# Patient Record
Sex: Female | Born: 1960 | Race: White | Hispanic: No | Marital: Married | Smoking: Never smoker
Health system: Southern US, Community
[De-identification: ages and names within clinical notes are randomized; demographics above are authoritative.]

## PROBLEM LIST (undated history)

## (undated) DIAGNOSIS — K635 Polyp of colon: Secondary | ICD-10-CM

## (undated) DIAGNOSIS — B009 Herpesviral infection, unspecified: Secondary | ICD-10-CM

## (undated) DIAGNOSIS — Z8719 Personal history of other diseases of the digestive system: Secondary | ICD-10-CM

## (undated) DIAGNOSIS — R002 Palpitations: Secondary | ICD-10-CM

## (undated) DIAGNOSIS — R197 Diarrhea, unspecified: Secondary | ICD-10-CM

## (undated) DIAGNOSIS — F419 Anxiety disorder, unspecified: Secondary | ICD-10-CM

## (undated) DIAGNOSIS — R079 Chest pain, unspecified: Secondary | ICD-10-CM

## (undated) HISTORY — DX: Polyp of colon: K63.5

## (undated) HISTORY — DX: Anxiety disorder, unspecified: F41.9

## (undated) HISTORY — DX: Palpitations: R00.2

## (undated) HISTORY — PX: CATARACT EXTRACTION: SUR2

## (undated) HISTORY — DX: Herpesviral infection, unspecified: B00.9

## (undated) HISTORY — DX: Personal history of other diseases of the digestive system: Z87.19

## (undated) HISTORY — DX: Chest pain, unspecified: R07.9

## (undated) HISTORY — DX: Diarrhea, unspecified: R19.7

## (undated) HISTORY — PX: OTHER SURGICAL HISTORY: SHX169

---

## 1996-04-27 HISTORY — PX: ABDOMINAL HYSTERECTOMY: SHX81

## 1997-12-21 ENCOUNTER — Encounter: Admission: RE | Admit: 1997-12-21 | Discharge: 1998-03-21 | Payer: Self-pay | Admitting: Psychiatry

## 1998-05-24 ENCOUNTER — Other Ambulatory Visit: Admission: RE | Admit: 1998-05-24 | Discharge: 1998-05-24 | Payer: Self-pay | Admitting: Gynecology

## 1998-07-26 ENCOUNTER — Inpatient Hospital Stay (HOSPITAL_COMMUNITY): Admission: AD | Admit: 1998-07-26 | Discharge: 1998-07-28 | Payer: Self-pay | Admitting: *Deleted

## 2000-05-21 ENCOUNTER — Encounter: Admission: RE | Admit: 2000-05-21 | Discharge: 2000-08-19 | Payer: Self-pay | Admitting: Gynecology

## 2000-06-25 ENCOUNTER — Other Ambulatory Visit: Admission: RE | Admit: 2000-06-25 | Discharge: 2000-06-25 | Payer: Self-pay | Admitting: Gynecology

## 2001-07-22 ENCOUNTER — Other Ambulatory Visit: Admission: RE | Admit: 2001-07-22 | Discharge: 2001-07-22 | Payer: Self-pay | Admitting: Gynecology

## 2001-08-05 ENCOUNTER — Encounter: Admission: RE | Admit: 2001-08-05 | Discharge: 2001-08-05 | Payer: Self-pay | Admitting: Gynecology

## 2001-08-05 ENCOUNTER — Encounter: Payer: Self-pay | Admitting: Gynecology

## 2001-08-11 ENCOUNTER — Encounter: Payer: Self-pay | Admitting: Gynecology

## 2001-08-11 ENCOUNTER — Encounter: Admission: RE | Admit: 2001-08-11 | Discharge: 2001-08-11 | Payer: Self-pay | Admitting: Gynecology

## 2002-01-18 ENCOUNTER — Ambulatory Visit: Admission: RE | Admit: 2002-01-18 | Discharge: 2002-01-18 | Payer: Self-pay | Admitting: Gynecology

## 2003-06-08 ENCOUNTER — Other Ambulatory Visit: Admission: RE | Admit: 2003-06-08 | Discharge: 2003-06-08 | Payer: Self-pay | Admitting: Gynecology

## 2003-07-24 ENCOUNTER — Ambulatory Visit (HOSPITAL_COMMUNITY): Admission: RE | Admit: 2003-07-24 | Discharge: 2003-07-24 | Payer: Self-pay | Admitting: Gynecology

## 2004-11-21 ENCOUNTER — Other Ambulatory Visit: Admission: RE | Admit: 2004-11-21 | Discharge: 2004-11-21 | Payer: Self-pay | Admitting: Gynecology

## 2004-12-05 ENCOUNTER — Ambulatory Visit (HOSPITAL_COMMUNITY): Admission: RE | Admit: 2004-12-05 | Discharge: 2004-12-05 | Payer: Self-pay | Admitting: Gynecology

## 2006-01-26 ENCOUNTER — Ambulatory Visit (HOSPITAL_COMMUNITY): Admission: RE | Admit: 2006-01-26 | Discharge: 2006-01-26 | Payer: Self-pay | Admitting: Gynecology

## 2006-01-29 ENCOUNTER — Other Ambulatory Visit: Admission: RE | Admit: 2006-01-29 | Discharge: 2006-01-29 | Payer: Self-pay | Admitting: Gynecology

## 2007-02-14 ENCOUNTER — Other Ambulatory Visit: Admission: RE | Admit: 2007-02-14 | Discharge: 2007-02-14 | Payer: Self-pay | Admitting: Gynecology

## 2007-03-08 ENCOUNTER — Ambulatory Visit (HOSPITAL_COMMUNITY): Admission: RE | Admit: 2007-03-08 | Discharge: 2007-03-08 | Payer: Self-pay | Admitting: Gynecology

## 2007-03-14 ENCOUNTER — Encounter: Admission: RE | Admit: 2007-03-14 | Discharge: 2007-03-14 | Payer: Self-pay | Admitting: Gynecology

## 2007-06-14 ENCOUNTER — Encounter: Admission: RE | Admit: 2007-06-14 | Discharge: 2007-06-14 | Payer: Self-pay | Admitting: Family Medicine

## 2008-02-24 ENCOUNTER — Other Ambulatory Visit: Admission: RE | Admit: 2008-02-24 | Discharge: 2008-02-24 | Payer: Self-pay | Admitting: Gynecology

## 2008-02-24 ENCOUNTER — Encounter: Payer: Self-pay | Admitting: Gynecology

## 2008-02-24 ENCOUNTER — Ambulatory Visit: Payer: Self-pay | Admitting: Gynecology

## 2008-03-19 ENCOUNTER — Ambulatory Visit (HOSPITAL_COMMUNITY): Admission: RE | Admit: 2008-03-19 | Discharge: 2008-03-19 | Payer: Self-pay | Admitting: Gynecology

## 2008-03-26 ENCOUNTER — Ambulatory Visit: Payer: Self-pay | Admitting: Gynecology

## 2008-04-02 ENCOUNTER — Ambulatory Visit: Payer: Self-pay | Admitting: Gynecology

## 2008-04-10 ENCOUNTER — Ambulatory Visit: Payer: Self-pay | Admitting: Gynecology

## 2009-03-15 ENCOUNTER — Other Ambulatory Visit: Admission: RE | Admit: 2009-03-15 | Discharge: 2009-03-15 | Payer: Self-pay | Admitting: Gynecology

## 2009-03-15 ENCOUNTER — Ambulatory Visit: Payer: Self-pay | Admitting: Gynecology

## 2009-03-26 ENCOUNTER — Ambulatory Visit (HOSPITAL_COMMUNITY): Admission: RE | Admit: 2009-03-26 | Discharge: 2009-03-26 | Payer: Self-pay | Admitting: Gynecology

## 2009-04-12 ENCOUNTER — Ambulatory Visit: Payer: Self-pay | Admitting: Gynecology

## 2009-05-07 ENCOUNTER — Ambulatory Visit: Payer: Self-pay | Admitting: Gynecology

## 2010-04-11 ENCOUNTER — Ambulatory Visit: Payer: Self-pay | Admitting: Women's Health

## 2010-05-18 ENCOUNTER — Encounter: Payer: Self-pay | Admitting: Gynecology

## 2010-05-18 ENCOUNTER — Encounter: Payer: Self-pay | Admitting: Neurological Surgery

## 2010-06-06 ENCOUNTER — Encounter: Payer: Self-pay | Admitting: Women's Health

## 2010-08-21 ENCOUNTER — Other Ambulatory Visit: Payer: Self-pay | Admitting: Gynecology

## 2010-08-21 DIAGNOSIS — Z1231 Encounter for screening mammogram for malignant neoplasm of breast: Secondary | ICD-10-CM

## 2010-08-22 ENCOUNTER — Other Ambulatory Visit (HOSPITAL_COMMUNITY)
Admission: RE | Admit: 2010-08-22 | Discharge: 2010-08-22 | Disposition: A | Discharge status: Home / Self Care | Payer: Managed Care, Other (non HMO) | Source: Ambulatory Visit | Attending: Gynecology | Admitting: Gynecology

## 2010-08-22 ENCOUNTER — Encounter (INDEPENDENT_AMBULATORY_CARE_PROVIDER_SITE_OTHER): Payer: Managed Care, Other (non HMO) | Admitting: Women's Health

## 2010-08-22 ENCOUNTER — Other Ambulatory Visit: Payer: Self-pay | Admitting: Women's Health

## 2010-08-22 DIAGNOSIS — Z833 Family history of diabetes mellitus: Secondary | ICD-10-CM

## 2010-08-22 DIAGNOSIS — Z01419 Encounter for gynecological examination (general) (routine) without abnormal findings: Secondary | ICD-10-CM

## 2010-08-22 DIAGNOSIS — Z13220 Encounter for screening for lipoid disorders: Secondary | ICD-10-CM

## 2010-08-22 DIAGNOSIS — Z124 Encounter for screening for malignant neoplasm of cervix: Secondary | ICD-10-CM | POA: Insufficient documentation

## 2010-08-26 ENCOUNTER — Ambulatory Visit (HOSPITAL_COMMUNITY)
Admission: RE | Admit: 2010-08-26 | Discharge: 2010-08-26 | Disposition: A | Discharge status: Home / Self Care | Payer: Managed Care, Other (non HMO) | Source: Ambulatory Visit | Attending: Gynecology | Admitting: Gynecology

## 2010-08-26 DIAGNOSIS — Z1231 Encounter for screening mammogram for malignant neoplasm of breast: Secondary | ICD-10-CM | POA: Insufficient documentation

## 2011-05-01 ENCOUNTER — Encounter: Payer: Self-pay | Admitting: Anesthesiology

## 2011-05-01 DIAGNOSIS — B009 Herpesviral infection, unspecified: Secondary | ICD-10-CM | POA: Insufficient documentation

## 2011-05-01 DIAGNOSIS — J45909 Unspecified asthma, uncomplicated: Secondary | ICD-10-CM | POA: Insufficient documentation

## 2011-05-06 ENCOUNTER — Ambulatory Visit (INDEPENDENT_AMBULATORY_CARE_PROVIDER_SITE_OTHER): Payer: Managed Care, Other (non HMO) | Admitting: Gynecology

## 2011-05-06 ENCOUNTER — Encounter: Payer: Self-pay | Admitting: Gynecology

## 2011-05-06 VITALS — BP 110/72

## 2011-05-06 DIAGNOSIS — N941 Unspecified dyspareunia: Secondary | ICD-10-CM

## 2011-05-06 DIAGNOSIS — B373 Candidiasis of vulva and vagina: Secondary | ICD-10-CM

## 2011-05-06 DIAGNOSIS — L293 Anogenital pruritus, unspecified: Secondary | ICD-10-CM

## 2011-05-06 DIAGNOSIS — N898 Other specified noninflammatory disorders of vagina: Secondary | ICD-10-CM

## 2011-05-06 DIAGNOSIS — IMO0002 Reserved for concepts with insufficient information to code with codable children: Secondary | ICD-10-CM

## 2011-05-06 DIAGNOSIS — B3731 Acute candidiasis of vulva and vagina: Secondary | ICD-10-CM

## 2011-05-06 LAB — WET PREP FOR TRICH, YEAST, CLUE
Clue Cells Wet Prep HPF POC: NONE SEEN
Trich, Wet Prep: NONE SEEN

## 2011-05-06 MED ORDER — TERCONAZOLE 0.4 % VA CREA: 1.0000 | TOPICAL_CREAM | Freq: Every day | VAGINAL | Status: DC

## 2011-05-06 NOTE — Progress Notes (Signed)
Patient presented to the office today with 2 complaints. She recently had been on antibiotic for root canal surgery. She began complaining on and off of dyspareunia on deep thrust and on withdrawal. She was also having vulvar pruritus and tried 3 days of Monistat with minimal relief. She is married in a monogamous relationship and has had a previous hysterectomy. Patient does not douche but does use K-Y jelly for lubrication during intercourse.  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 She will be placed on Terazol 7 to apply intravaginally and externally for one week and to refrain from intercourse during that time. When she resumes intercourse she will use the K-Y jelly more liberally and she still continues to experience dyspareunia she'll return to the office for further evaluation such as an ultrasound and discussion of possible laparoscopy to rule out endometriosis. Literature information was provided on both the above subject matters all questions were answered we'll follow accordingly.

## 2011-05-06 NOTE — Patient Instructions (Signed)
Dyspareunia Dyspareunia is pain during sexual intercourse. It is most common in women, but it also happens in men.  CAUSES  Female The pain from this condition is usually felt when anything is put into the vagina, but any part of the genitals may cause pain during sex. Even sitting or wearing pants can cause pain. Sometimes, a cause cannot be found. Some causes of pain during intercourse are:  Infections of the skin around the vagina.   Vaginal infections, such as a yeast, bacterial, or viral infection.   Vaginismus. This is the inability to have anything put in the vagina even when the woman wants it to happen. There is an automatic muscle contraction and pain. The pain of the muscle contraction can be so severe that intercourse is impossible.   Allergic reaction from spermicides, semen, condoms, scented tampons, soaps, douches, and vaginal sprays.   A fluid-filled sac (cyst) on the Bartholin or Skene glands, located at the opening of the vagina.   Scar tissue in the vagina from a surgically enlarged opening (episiotomy) or tearing after delivering a baby.   Vaginal dryness. This is more common in menopause. The normal secretions of the vagina are decreased. Changes in estrogen levels and increased difficulty becoming aroused can cause painful sex. Vaginal dryness can also happen when taking birth control pills.   Thinning of the tissue (atrophy) of the vulva and vagina. This makes the area thinner, smaller, unable to stretch to accommodate a penis, and prone to infection and tearing.   Vulvar vestibulitis or vestibulodynia.This is a condition that causes pain involving the area around the entrance to the vagina.The most common cause in young women is birth control pills.Women with low estrogen levels (postmenopausal women) may also experience this.Other causes include allergic reactions, too many nerve endings, skin conditions, and pelvic muscles that cannot relax.   Vulvar dermatoses.  This includes skin conditions such as lichen sclerosus and lichen planus.   Lack of foreplay to lubricate the vagina. This can cause vaginal dryness.   Noncancerous tumors (fibroids) in the uterus.   Uterus lining tissue growing outside the uterus (endometriosis).   Pregnancy that starts in the fallopian tube (tubal pregnancy).   Pregnancy or breastfeeding your baby. This can cause vaginal dryness.   A tilting or prolapse of the uterus. Prolapse is when weak and stretched muscles around the uterus allow it to fall into the vagina.   Problems with the ovaries, cysts, or scar tissue. This may be worse with certain sexual positions.   Previous surgeries causing adhesions or scar tissue in the vagina or pelvis.   Bladder and intestinal problems.   Psychological problems (such as depression or anxiety). This may make pain worse.   Negative attitudes about sex, experiencing rape, sexual assault, and misinformation about sex. These issues are often related to some types of pain.   Previous pelvic infection, causing scar tissue in the pelvis and on the female organs.   Cyst or tumor on the ovary.   Cancer of the female organs.   Certain medicines.   Medical problems such as diabetes, arthritis, or thyroid disease.  Female In men, there are many physical causes of sexual discomfort. Some causes of pain during intercourse are:  Infections of the prostate, bladder, or seminal vesicles. This can cause pain after ejaculation.   An inflamed bladder (interstitial cystitis). This may cause pain from ejaculation.   Gonorrheal infections. This may cause pain during ejaculation.   An inflamed urethra (urethritis) or inflamed  prostate (prostatitis). This can make genital stimulation painful or uncomfortable.   Deformities of the penis, such as Peyronie's disease.   A tight foreskin.   Cancer of the female organs.   Psychological problems. This may make pain worse.  DIAGNOSIS   Your  caregiver will take a history and have you describe where the pain is located (outside the vagina, in the vagina, in the pelvis). You may be asked when you experience pain, such as with penetration or with thrusting.   Following this, your caregiver will do a physical exam. Let your caregiver know if the exam is too painful.   During the final part of the female exam, your caregiver will feel your uterus and ovaries with one hand on the abdomen and one finger in your vagina. This is a pelvic exam.   Blood tests, a Pap test, cultures for infection, an ultrasound test, and X-rays may be done. You may need to see a specialist for female problems (gynecologist).   Your caregiver may do a CT scan, MRI, or laparoscopy. Laparoscopy is a procedure to look into the pelvis with a lighted tube, through a cut (incision) in the abdomen.  TREATMENT  Your caregiver can help you determine the best course of treatment. Sometimes, more testing is done. Continue with the suggested testing until your caregiver feels sure about your diagnosis and how to treat it. Sometimes, it is difficult to find the reason for the pain. The search for the cause and treatment can be frustrating. Treatment often takes several weeks to a few months before you notice any improvement. You may also need to avoid sexual activity until symptoms improve.Continuing to have sex when it hurts can delay healing and actually make the problem worse. The treatment depends on the cause of the pain. Treatment may include:  Medicines such as antibiotics, vaginal or skin creams, hormones, or antidepressants.   Minor or major surgery.   Psychological counseling or group therapy.   Kegel exercises and vaginal dilators to help certain cases of vaginismus (spasms). Do this only if recommended by your caregiver.Kegel exercises can make some problems worse.   Applying lubrication as recommended by your caregiver if you have dryness.   Sex therapy for  you and your sex partner.  It is common for the pain to continue after the reason for the pain has been treated. Some reasons for this include a conditioned response. This means the person having the pain becomes so familiar with the pain that the pain continues as a response, even though the cause is removed. Sex therapy can help with this problem. HOME CARE INSTRUCTIONS   Follow your caregiver's instructions about taking medicines, tests, counseling, and follow-up treatment.   Do not use scented tampons, douches, vaginal sprays, or soaps.   Use water-based lubricants for dryness. Oil lubricants can cause irritation.   Do not use spermicides or condoms that irritate you.   Openly discuss with your partner your sexual experience, your desires, foreplay, and different sexual positions for a more comfortable and enjoyable sexual relationship.   Join group sessions for therapy, if needed.   Practice safe sex at all times.   Empty your bladder before having intercourse.   Try different positions during sexual intercourse.   Take over-the-counter pain medicine recommended by your caregiver before having sexual intercourse.   Do not wear pantyhose. Knee-high and thigh-high hose are okay.   Avoid scrubbing your vulva with a washcloth. Wash the area gently and pat dry   with a towel.  SEEK MEDICAL CARE IF:   You develop vaginal bleeding after sexual intercourse.   You develop a lump at the opening of your vagina, even if it is not painful.   You have abnormal vaginal discharge.   You have vaginal dryness.   You have itching or irritation of the vulva or vagina.   You develop a rash or reaction to your medicine.  SEEK IMMEDIATE MEDICAL CARE IF:   You develop severe abdominal pain during or shortly after sexual intercourse. You could have a ruptured ovarian cyst or ruptured tubal pregnancy.   You have a fever.   You have painful or bloody urination.   You have painful sexual  intercourse, and you never had it before.   You pass out after having sexual intercourse.  Document Released: 05/03/2007 Document Revised: 12/24/2010 Document Reviewed: 07/14/2010 [facility] Patient Information 2012 [place], [place].Endometriosis Endometriosis is a disease that occurs when the endometrium (lining of the uterus) is misplaced outside of its normal location. It may occur in many locations close to the uterus (womb), but commonly on the ovaries, fallopian tubes, vagina (birth canal) and bowel located close to the uterus. Because the uterus sloughs (expels) its lining every month (menses), there is bleeding whereever the endometrial tissue is located. SYMPTOMS  Often there are no symptoms. However, because blood is irritating to tissues not normally exposed to it, when symptoms occur they vary with the location of the misplaced endometrium. Symptoms often include back and abdominal pain. Periods may be heavier and intercourse may be painful. Infertility may be present. You may have all of these symptoms at one time or another or you may have months with no symptoms at all. Although the symptoms occur mainly during menses, they can occur mid-cycle as well, and usually terminate with menopause. DIAGNOSIS  Your caregiver may recommend a blood test and urine test (urinalysis) to help rule out other conditions. Another common test is ultrasound, a painless procedure that uses sound waves to make a sonogram "picture" of abnormal tissue that could be endometriosis. If your bowel movements are painful around your periods, your caregiver may advise a barium enema (an X-ray of the lower bowel), to try to find the source of your pain. This is sometimes confirmed by laparoscopy. Laparoscopy is a procedure where your caregiver looks into your abdomen with a laparoscope (a small pencil sized telescope). Your caregiver may take a tiny piece of tissue (biopsy) from any abnormal tissue to confirm or document your  problem. These tissues are sent to the lab and a pathologist looks at them under the microscope to give a microscopic diagnosis. TREATMENT  Once the diagnosis is made, it can be treated by destruction of the misplaced endometrial tissue using heat (diathermy), laser, cutting (excision), or chemical means. It may also be treated with hormonal therapy. When using hormonal therapy menses are eliminated, therefore eliminating the monthly exposure to blood by the misplaced endometrial tissue. Only in severe cases is it necessary to perform a hysterectomy with removal of the tubes, uterus and ovaries. HOME CARE INSTRUCTIONS   Only take over-the-counter or prescription medicines for pain, discomfort, or fever as directed by your caregiver.   Avoid activities that produce pain, including a physical sexual relationship.   Do not take aspirin as this may increase bleeding when not on hormonal therapy.   See your caregiver for pain or problems not controlled with treatment.  SEEK IMMEDIATE MEDICAL CARE IF:   Your pain is severe  and is not responding to pain medicine.   You develop severe nausea and vomiting, or you cannot keep foods down.   Your pain localizes to the right lower part of your abdomen (possible appendicitis).   You have swelling or increasing pain in the abdomen.   You have a fever.   You see blood in your stool.  MAKE SURE YOU:   Understand these instructions.   Will watch your condition.   Will get help right away if you are not doing well or get worse.  Document Released: 04/10/2000 Document Revised: 12/24/2010 Document Reviewed: 11/30/2007 [facility] Patient Information 2012 [place], [place].

## 2011-05-13 ENCOUNTER — Telehealth: Payer: Self-pay | Admitting: *Deleted

## 2011-05-13 DIAGNOSIS — B3731 Acute candidiasis of vulva and vagina: Secondary | ICD-10-CM

## 2011-05-13 DIAGNOSIS — B373 Candidiasis of vulva and vagina: Secondary | ICD-10-CM

## 2011-05-13 DIAGNOSIS — N898 Other specified noninflammatory disorders of vagina: Secondary | ICD-10-CM

## 2011-05-13 MED ORDER — TERCONAZOLE 0.4 % VA CREA: 1.0000 | TOPICAL_CREAM | Freq: Every day | VAGINAL | Status: AC

## 2011-05-13 MED ORDER — FLUCONAZOLE 150 MG PO TABS: ORAL_TABLET | ORAL | Status: DC

## 2011-05-13 NOTE — Telephone Encounter (Signed)
Pt was see on 05/06/11 for vagina itching and giving Terazol 7 day cream. Pt has taken all rx and itching still there. Pt would like rx, please advise.

## 2011-05-13 NOTE — Telephone Encounter (Signed)
Persistent vulvovaginits will prescribe one more week of Terazol 7 to apply intravaginal for one week along with Duflucam 150mg  po every other day for three days. If persist after this she will need to be seen in the office

## 2011-05-13 NOTE — Telephone Encounter (Signed)
Rx sent pt pharmacy, pt informed with the below note.

## 2011-07-27 ENCOUNTER — Other Ambulatory Visit: Payer: Self-pay | Admitting: Gynecology

## 2011-07-27 DIAGNOSIS — Z1231 Encounter for screening mammogram for malignant neoplasm of breast: Secondary | ICD-10-CM

## 2011-08-28 ENCOUNTER — Encounter: Payer: Managed Care, Other (non HMO) | Admitting: Gynecology

## 2011-09-03 ENCOUNTER — Ambulatory Visit (HOSPITAL_COMMUNITY): Payer: Managed Care, Other (non HMO)

## 2011-09-11 ENCOUNTER — Encounter: Payer: Managed Care, Other (non HMO) | Admitting: Gynecology

## 2011-10-09 ENCOUNTER — Encounter: Payer: Self-pay | Admitting: Gynecology

## 2011-10-09 ENCOUNTER — Other Ambulatory Visit (HOSPITAL_COMMUNITY)
Admission: RE | Admit: 2011-10-09 | Discharge: 2011-10-09 | Disposition: A | Discharge status: Home / Self Care | Payer: Managed Care, Other (non HMO) | Source: Ambulatory Visit | Attending: Gynecology | Admitting: Gynecology

## 2011-10-09 ENCOUNTER — Ambulatory Visit (INDEPENDENT_AMBULATORY_CARE_PROVIDER_SITE_OTHER): Payer: Managed Care, Other (non HMO) | Admitting: Gynecology

## 2011-10-09 ENCOUNTER — Ambulatory Visit (HOSPITAL_COMMUNITY): Payer: Managed Care, Other (non HMO)

## 2011-10-09 VITALS — BP 108/70 | Ht 66.25 in | Wt 198.0 lb

## 2011-10-09 DIAGNOSIS — N941 Unspecified dyspareunia: Secondary | ICD-10-CM

## 2011-10-09 DIAGNOSIS — Z01419 Encounter for gynecological examination (general) (routine) without abnormal findings: Secondary | ICD-10-CM | POA: Insufficient documentation

## 2011-10-09 DIAGNOSIS — R635 Abnormal weight gain: Secondary | ICD-10-CM | POA: Insufficient documentation

## 2011-10-09 DIAGNOSIS — Z833 Family history of diabetes mellitus: Secondary | ICD-10-CM | POA: Insufficient documentation

## 2011-10-09 DIAGNOSIS — Z1151 Encounter for screening for human papillomavirus (HPV): Secondary | ICD-10-CM | POA: Insufficient documentation

## 2011-10-09 DIAGNOSIS — N949 Unspecified condition associated with female genital organs and menstrual cycle: Secondary | ICD-10-CM

## 2011-10-09 DIAGNOSIS — R102 Pelvic and perineal pain: Secondary | ICD-10-CM

## 2011-10-09 DIAGNOSIS — IMO0002 Reserved for concepts with insufficient information to code with codable children: Secondary | ICD-10-CM

## 2011-10-09 LAB — LIPID PANEL
Cholesterol: 223 mg/dL — ABNORMAL HIGH (ref 0–200)
HDL: 58 mg/dL (ref >39)
LDL Cholesterol: 140 mg/dL — ABNORMAL HIGH (ref 0–99)
Total CHOL/HDL Ratio: 3.8 Ratio
Triglycerides: 126 mg/dL (ref <150)
VLDL: 25 mg/dL (ref 0–40)

## 2011-10-09 LAB — CBC WITH DIFFERENTIAL/PLATELET
Basophils Absolute: 0.1 10*3/uL (ref 0.0–0.1)
Basophils Relative: 1 % (ref 0–1)
Eosinophils Absolute: 0.2 10*3/uL (ref 0.0–0.7)
Eosinophils Relative: 4 % (ref 0–5)
HCT: 41.1 % (ref 36.0–46.0)
Hemoglobin: 13.7 g/dL (ref 12.0–15.0)
Lymphocytes Relative: 35 % (ref 12–46)
Lymphs Abs: 2.3 10*3/uL (ref 0.7–4.0)
MCH: 29.5 pg (ref 26.0–34.0)
MCHC: 33.3 g/dL (ref 30.0–36.0)
MCV: 88.4 fL (ref 78.0–100.0)
Monocytes Absolute: 0.5 10*3/uL (ref 0.1–1.0)
Monocytes Relative: 7 % (ref 3–12)
Neutro Abs: 3.5 10*3/uL (ref 1.7–7.7)
Neutrophils Relative %: 53 % (ref 43–77)
Platelets: 237 10*3/uL (ref 150–400)
RBC: 4.65 MIL/uL (ref 3.87–5.11)
RDW: 13.8 % (ref 11.5–15.5)
WBC: 6.7 10*3/uL (ref 4.0–10.5)

## 2011-10-09 LAB — COMPREHENSIVE METABOLIC PANEL
ALT: 12 U/L (ref 0–35)
AST: 17 U/L (ref 0–37)
Albumin: 4.2 g/dL (ref 3.5–5.2)
Alkaline Phosphatase: 49 U/L (ref 39–117)
BUN: 9 mg/dL (ref 6–23)
CO2: 27 mEq/L (ref 19–32)
Calcium: 9.3 mg/dL (ref 8.4–10.5)
Chloride: 105 mEq/L (ref 96–112)
Creat: 0.58 mg/dL (ref 0.50–1.10)
Glucose, Bld: 93 mg/dL (ref 70–99)
Potassium: 4.2 mEq/L (ref 3.5–5.3)
Sodium: 140 mEq/L (ref 135–145)
Total Bilirubin: 0.5 mg/dL (ref 0.3–1.2)
Total Protein: 6.7 g/dL (ref 6.0–8.3)

## 2011-10-09 LAB — TSH: TSH: 1.313 u[IU]/mL (ref 0.350–4.500)

## 2011-10-09 MED ORDER — VALACYCLOVIR HCL 500 MG PO TABS: 500.0000 mg | ORAL_TABLET | Freq: Every day | ORAL | Status: DC

## 2011-10-09 NOTE — Progress Notes (Signed)
Jade [name] 1960-06-11 [id number]   History:    51 y.o.  for annual gyn exam with complaint of dyspareunia. Patient has had a prior transvaginal hysterectomy 1998. She states her dyspareunia using causing her have discomfort more right lower abdomen. Her mammogram was in 2012 which was normal she has one scheduled for later this month. She frequently does her self breast examination. She takes Valtrex for HSV suppression. She takes Advair when necessary for her asthma. She has had issues with weight gain.  Past medical history,surgical history, family history and social history were all reviewed and documented in the EPIC chart.  Gynecologic History No LMP recorded. Patient has had a hysterectomy. Contraception: none Last Pap: 2012. Results were: normal Last mammogram: 2012. Results were: normal  Obstetric History OB History    Grav Para Term Preterm Abortions TAB SAB Ect Mult Living   1 1 1       1      # Outc Date GA Lbr Len/2nd Wgt Sex Del Anes PTL Lv   1 TRM     F CS  No Yes       ROS: A ROS was performed and pertinent positives and negatives are included in the history.  GENERAL: No fevers or chills. HEENT: No change in vision, no earache, sore throat or sinus congestion. NECK: No pain or stiffness. CARDIOVASCULAR: No chest pain or pressure. No palpitations. PULMONARY: No shortness of breath, cough or wheeze. GASTROINTESTINAL: No abdominal pain, nausea, vomiting or diarrhea, melena or bright red blood per rectum. GENITOURINARY: No urinary frequency, urgency, hesitancy or dysuria. MUSCULOSKELETAL: No joint or muscle pain, no back pain, no recent trauma. DERMATOLOGIC: No rash, no itching, no lesions. ENDOCRINE: No polyuria, polydipsia, no heat or cold intolerance. No recent change in weight. HEMATOLOGICAL: No anemia or easy bruising or bleeding. NEUROLOGIC: No headache, seizures, numbness, tingling or weakness. PSYCHIATRIC: No depression, no loss of interest in normal activity or change  in sleep pattern.     Exam: chaperone present  BP 108/70  Ht 5' 6.25" (1.683 m)  Wt 198 lb (89.812 kg)  BMI 31.72 kg/m2  Body mass index is 31.72 kg/(m^2).  General appearance : Well developed well nourished female. No acute distress HEENT: Neck supple, trachea midline, no carotid bruits, no thyroidmegaly Lungs: Clear to auscultation, no rhonchi or wheezes, or rib retractions  Heart: Regular rate and rhythm, no murmurs or gallops Breast:Examined in sitting and supine position were symmetrical in appearance, no palpable masses or tenderness,  no skin retraction, no nipple inversion, no nipple discharge, no skin discoloration, no axillary or supraclavicular lymphadenopathy Abdomen: no palpable masses or tenderness, no rebound or guarding Extremities: no edema or skin discoloration or tenderness  Pelvic:  Bartholin, Urethra, Skene Glands: Within normal limits             Vagina: No gross lesions or discharge  Cervix: Absent  Uterus  absent  Adnexa  Without masses or tenderness  Anus and perineum  normal   Rectovaginal  normal sphincter tone without palpated masses or tenderness             Hemoccult not done   Assessment/Plan:  51 y.o. female for annual exam who had the following lab work drawn today: Fasting lipid profile, conference metabolic panel, TSH, CBC, urinalysis. New that near screening guidelines discussed I explained to the patient that she no longer needs Pap smears but she insisted having one last one today. Patient will return back in 2-3 weeks for  followup ultrasound to possibly determine her etiology of her dyspareunia to better assess her ovaries. She was instructed take her calcium and vitamin D and to exercise regularly. Literature information on exercise and diet was provided. She was reminded to continue to do monthly self breast examination and to followup with her mammogram in the next few weeks.    Ok Edwards MD, 9:11 AM 10/09/2011

## 2011-10-09 NOTE — Patient Instructions (Addendum)
Cholesterol Control Diet  Cholesterol levels in your body are determined significantly by your diet. Cholesterol levels may also be related to heart disease. The following material helps to explain this relationship and discusses what you can do to help keep your heart healthy. Not all cholesterol is bad. Low-density lipoprotein (LDL) cholesterol is the "bad" cholesterol. It may cause fatty deposits to build up inside your arteries. High-density lipoprotein (HDL) cholesterol is "good." It helps to remove the "bad" LDL cholesterol from your blood. Cholesterol is a very important risk factor for heart disease. Other risk factors are high blood pressure, smoking, stress, heredity, and weight. The heart muscle gets its supply of blood through the coronary arteries. If your LDL cholesterol is high and your HDL cholesterol is low, you are at risk for having fatty deposits build up in your coronary arteries. This leaves less room through which blood can flow. Without sufficient blood and oxygen, the heart muscle cannot function properly and you may feel chest pains (angina pectoris). When a coronary artery closes up entirely, a part of the heart muscle may die, causing a heart attack (myocardial infarction). CHECKING CHOLESTEROL When your caregiver sends your blood to a lab to be analyzed for cholesterol, a complete lipid (fat) profile may be done. With this test, the total amount of cholesterol and levels of LDL and HDL are determined. Triglycerides are a type of fat that circulates in the blood and can also be used to determine heart disease risk. The list below describes what the numbers should be: Test: Total Cholesterol.  Less than 200 mg/dl.  Test: LDL "bad cholesterol."  Less than 100 mg/dl.   Less than 70 mg/dl if you are at very high risk of a heart attack or sudden cardiac death.  Test: HDL "good cholesterol."  Greater than 50 mg/dl for women.    Greater than 40 mg/dl for men.  Test: Triglycerides.  Less than 150 mg/dl.  CONTROLLING CHOLESTEROL WITH DIET Although exercise and lifestyle factors are important, your diet is key. That is because certain foods are known to raise cholesterol and others to lower it. The goal is to balance foods for their effect on cholesterol and more importantly, to replace saturated and trans fat with other types of fat, such as monounsaturated fat, polyunsaturated fat, and omega-3 fatty acids. On average, a person should consume no more than 15 to 17 g of saturated fat daily. Saturated and trans fats are considered "bad" fats, and they will raise LDL cholesterol. Saturated fats are primarily found in animal products such as meats, butter, and cream. However, that does not mean you need to sacrifice all your favorite foods. Today, there are good tasting, low-fat, low-cholesterol substitutes for most of the things you like to eat. Choose low-fat or nonfat alternatives. Choose round or loin cuts of red meat, since these types of cuts are lowest in fat and cholesterol. Chicken (without the skin), fish, veal, and ground turkey breast are excellent choices. Eliminate fatty meats, such as hot dogs and salami. Even shellfish have little or no saturated fat. Have a 3 oz (85 g) portion when you eat lean meat, poultry, or fish. Trans fats are also called "partially hydrogenated oils." They are oils that have been scientifically manipulated so that they are solid at room temperature resulting in a longer shelf life and improved taste and texture of foods in which they are added. Trans fats are found in stick margarine, some tub margarines, cookies, crackers, and baked goods.    When baking and cooking, oils are an excellent substitute for butter. The monounsaturated oils are especially beneficial since it is believed they lower LDL and raise HDL. The oils you should avoid entirely are saturated tropical oils, such as coconut and  palm.  Remember to eat liberally from food groups that are naturally free of saturated and trans fat, including fish, fruit, vegetables, beans, grains (barley, rice, couscous, bulgur wheat), and pasta (without cream sauces).  IDENTIFYING FOODS THAT LOWER CHOLESTEROL  Soluble fiber may lower your cholesterol. This type of fiber is found in fruits such as apples, vegetables such as broccoli, potatoes, and carrots, legumes such as beans, peas, and lentils, and grains such as barley. Foods fortified with plant sterols (phytosterol) may also lower cholesterol. You should eat at least 2 g per day of these foods for a cholesterol lowering effect.  Read package labels to identify low-saturated fats, trans fats free, and low-fat foods at the supermarket. Select cheeses that have only 2 to 3 g saturated fat per ounce. Use a heart-healthy tub margarine that is free of trans fats or partially hydrogenated oil. When buying baked goods (cookies, crackers), avoid partially hydrogenated oils. Breads and muffins should be made from whole grains (whole-wheat or whole oat flour, instead of "flour" or "enriched flour"). Buy non-creamy canned soups with reduced salt and no added fats.  FOOD PREPARATION TECHNIQUES  Never deep-fry. If you must fry, either stir-fry, which uses very little fat, or use non-stick cooking sprays. When possible, broil, bake, or roast meats, and steam vegetables. Instead of dressing vegetables with butter or margarine, use lemon and herbs, applesauce and cinnamon (for squash and sweet potatoes), nonfat yogurt, salsa, and low-fat dressings for salads.  LOW-SATURATED FAT / LOW-FAT FOOD SUBSTITUTES Meats / Saturated Fat (g)  Avoid: Steak, marbled (3 oz/85 g) / 11 g   Choose: Steak, lean (3 oz/85 g) / 4 g   Avoid: Hamburger (3 oz/85 g) / 7 g   Choose: Hamburger, lean (3 oz/85 g) / 5 g   Avoid: Ham (3 oz/85 g) / 6 g   Choose: Ham, lean cut (3 oz/85 g) / 2.4 g   Avoid: Chicken, with skin, dark  meat (3 oz/85 g) / 4 g   Choose: Chicken, skin removed, dark meat (3 oz/85 g) / 2 g   Avoid: Chicken, with skin, light meat (3 oz/85 g) / 2.5 g   Choose: Chicken, skin removed, light meat (3 oz/85 g) / 1 g  Dairy / Saturated Fat (g)  Avoid: Whole milk (1 cup) / 5 g   Choose: Low-fat milk, 2% (1 cup) / 3 g   Choose: Low-fat milk, 1% (1 cup) / 1.5 g   Choose: Skim milk (1 cup) / 0.3 g   Avoid: Hard cheese (1 oz/28 g) / 6 g   Choose: Skim milk cheese (1 oz/28 g) / 2 to 3 g   Avoid: Cottage cheese, 4% fat (1 cup) / 6.5 g   Choose: Low-fat cottage cheese, 1% fat (1 cup) / 1.5 g   Avoid: Ice cream (1 cup) / 9 g   Choose: Sherbet (1 cup) / 2.5 g   Choose: Nonfat frozen yogurt (1 cup) / 0.3 g   Choose: Frozen fruit bar / trace   Avoid: Whipped cream (1 tbs) / 3.5 g   Choose: Nondairy whipped topping (1 tbs) / 1 g  Condiments / Saturated Fat (g)  Avoid: Mayonnaise (1 tbs) / 2 g   Choose: Low-fat   mayonnaise (1 tbs) / 1 g   Avoid: Butter (1 tbs) / 7 g   Choose: Extra light margarine (1 tbs) / 1 g   Avoid: Coconut oil (1 tbs) / 11.8 g   Choose: Olive oil (1 tbs) / 1.8 g   Choose: Corn oil (1 tbs) / 1.7 g   Choose: Safflower oil (1 tbs) / 1.2 g   Choose: Sunflower oil (1 tbs) / 1.4 g   Choose: Soybean oil (1 tbs) / 2.4 g   Choose: Canola oil (1 tbs) / 1 g  Document Released: 04/13/2005 Document Revised: 12/24/2010 Document Reviewed: 10/02/2010 [employer] Patient Information 2012 [employer].  Exercise to Lose Weight Exercise and a healthy diet may help you lose weight. Your doctor may suggest specific exercises. EXERCISE IDEAS AND TIPS  Choose low-cost things you enjoy doing, such as walking, bicycling, or exercising to workout videos.   Take stairs instead of the elevator.   Walk during your lunch break.   Park your car further away from work or school.   Go to a gym or an exercise class.   Start with 5 to 10 minutes of exercise each day. Build up to  30 minutes of exercise 4 to 6 days a week.   Wear shoes with good support and comfortable clothes.   Stretch before and after working out.   Work out until you breathe harder and your heart beats faster.   Drink extra water when you exercise.   Do not do so much that you hurt yourself, feel dizzy, or get very short of breath.  Exercises that burn about 150 calories:  Running 1  miles in 15 minutes.   Playing volleyball for 45 to 60 minutes.   Washing and waxing a car for 45 to 60 minutes.   Playing touch football for 45 minutes.   Walking 1  miles in 35 minutes.   Pushing a stroller 1  miles in 30 minutes.   Playing basketball for 30 minutes.   Raking leaves for 30 minutes.   Bicycling 5 miles in 30 minutes.   Walking 2 miles in 30 minutes.   Dancing for 30 minutes.   Shoveling snow for 15 minutes.   Swimming laps for 20 minutes.   Walking up stairs for 15 minutes.   Bicycling 4 miles in 15 minutes.   Gardening for 30 to 45 minutes.   Jumping rope for 15 minutes.   Washing windows or floors for 45 to 60 minutes.  Document Released: 05/16/2010 Document Revised: 12/24/2010 Document Reviewed: 05/16/2010 [employer] Patient Information 2012 [employer].  

## 2011-10-10 LAB — URINALYSIS W MICROSCOPIC + REFLEX CULTURE
Bacteria, UA: NONE SEEN
Bilirubin Urine: NEGATIVE
Casts: NONE SEEN
Crystals: NONE SEEN
Glucose, UA: NEGATIVE mg/dL
Hgb urine dipstick: NEGATIVE
Ketones, ur: NEGATIVE mg/dL
Leukocytes, UA: NEGATIVE
Nitrite: NEGATIVE
Protein, ur: NEGATIVE mg/dL
Specific Gravity, Urine: 1.021 (ref 1.005–1.030)
Squamous Epithelial / LPF: NONE SEEN
Urobilinogen, UA: 0.2 mg/dL (ref 0.0–1.0)
pH: 5.5 (ref 5.0–8.0)

## 2011-10-20 ENCOUNTER — Ambulatory Visit (HOSPITAL_COMMUNITY)
Admission: RE | Admit: 2011-10-20 | Discharge: 2011-10-20 | Disposition: A | Discharge status: Home / Self Care | Payer: Managed Care, Other (non HMO) | Source: Ambulatory Visit | Attending: Gynecology | Admitting: Gynecology

## 2011-10-20 DIAGNOSIS — Z1231 Encounter for screening mammogram for malignant neoplasm of breast: Secondary | ICD-10-CM | POA: Insufficient documentation

## 2011-11-13 ENCOUNTER — Ambulatory Visit: Payer: Managed Care, Other (non HMO) | Admitting: Gynecology

## 2011-11-13 ENCOUNTER — Other Ambulatory Visit: Payer: Managed Care, Other (non HMO)

## 2011-11-18 ENCOUNTER — Encounter: Payer: Self-pay | Admitting: Gynecology

## 2011-11-18 ENCOUNTER — Ambulatory Visit (INDEPENDENT_AMBULATORY_CARE_PROVIDER_SITE_OTHER): Payer: Managed Care, Other (non HMO) | Admitting: Gynecology

## 2011-11-18 ENCOUNTER — Ambulatory Visit (INDEPENDENT_AMBULATORY_CARE_PROVIDER_SITE_OTHER): Payer: Managed Care, Other (non HMO)

## 2011-11-18 VITALS — BP 126/84

## 2011-11-18 DIAGNOSIS — IMO0002 Reserved for concepts with insufficient information to code with codable children: Secondary | ICD-10-CM

## 2011-11-18 DIAGNOSIS — N941 Unspecified dyspareunia: Secondary | ICD-10-CM

## 2011-11-18 DIAGNOSIS — N949 Unspecified condition associated with female genital organs and menstrual cycle: Secondary | ICD-10-CM

## 2011-11-18 DIAGNOSIS — N83209 Unspecified ovarian cyst, unspecified side: Secondary | ICD-10-CM

## 2011-11-18 DIAGNOSIS — N951 Menopausal and female climacteric states: Secondary | ICD-10-CM

## 2011-11-18 DIAGNOSIS — R102 Pelvic and perineal pain unspecified side: Secondary | ICD-10-CM

## 2011-11-18 DIAGNOSIS — N839 Noninflammatory disorder of ovary, fallopian tube and broad ligament, unspecified: Secondary | ICD-10-CM

## 2011-11-18 DIAGNOSIS — N8300 Follicular cyst of ovary, unspecified side: Secondary | ICD-10-CM

## 2011-11-18 NOTE — Progress Notes (Addendum)
51 y.o. who was seen in the office on June 14 for annual gyn exam who was complaining of worsening  dyspareunia regardless of position. Patient has had a prior transvaginal hysterectomy 1998. She states that her dyspareunia also causes her to have discomfort more right lower abdomen. She also is complaining of vaginal dryness and uses lubricants at time of intercourse. Patient denies any vasomotor symptoms at the present time. Patient presented to the office today for an ultrasound with the following findings:  Right ovary was normal with small 5 mm follicle was noted left ovary thinwall cyst with low level internal echoes measuring 9 x 7 x 10 mm   We discussed several options today. She will stop by the lab so we can check an [facility] level today. I've given her samples of Vagifem 10 mcg to place intravaginally daily for one week followed by twice a week thereafter for vaginal atrophy if her [facility] is in the perimenopause/menopause range. If her FSH come back normal we had discussed about proceeding with a laparoscopy with possible removal of both ovaries since she is almost 51 years of age. Her dyspareunia may be attributed to the ovaries lying close to the vaginal cuff contributing to her symptom. Patient states that she's had this pelvic discomfort for several years and is getting worse. In the past she has had ovarian cysts and she would like to proceed with definitive surgery if indicated. The risks benefits and pros and cons of hormone replacement therapy were discussed. Her mammograms are up-to-date and she does her monthly self breast examination.

## 2011-11-19 LAB — FOLLICLE STIMULATING HORMONE: FSH: 64.7 m[IU]/mL

## 2011-11-25 ENCOUNTER — Other Ambulatory Visit: Payer: Self-pay | Admitting: Gynecology

## 2011-11-25 MED ORDER — ESTRADIOL 10 MCG VA TABS: ORAL_TABLET | VAGINAL | Status: DC

## 2011-12-25 ENCOUNTER — Other Ambulatory Visit: Payer: Self-pay | Admitting: *Deleted

## 2011-12-25 MED ORDER — VALACYCLOVIR HCL 500 MG PO TABS: 500.0000 mg | ORAL_TABLET | Freq: Every day | ORAL | Status: DC

## 2011-12-25 MED ORDER — ALPRAZOLAM 0.25 MG PO TABS: 0.2500 mg | ORAL_TABLET | Freq: Every evening | ORAL | Status: AC | PRN

## 2011-12-25 MED ORDER — FLUTICASONE-SALMETEROL 115-21 MCG/ACT IN AERO: 2.0000 | INHALATION_SPRAY | Freq: Two times a day (BID) | RESPIRATORY_TRACT | Status: DC

## 2011-12-25 NOTE — Addendum Note (Signed)
Addended by: Aura Camps on: 12/25/2011 01:54 PM   Modules accepted: Orders

## 2011-12-25 NOTE — Telephone Encounter (Signed)
Ok to refill both?? 

## 2011-12-25 NOTE — Telephone Encounter (Signed)
Patient to have a prescription refill for Xanax 0.25 mg to take 1 by mouth daily when necessary #32 refills. Patient can also have a prescription refill for her Advair inhaler #1 refill x2 to use when necessary

## 2011-12-25 NOTE — Telephone Encounter (Signed)
Pt is calling requesting xanax 0.25 & advair HA inhaler rx. Annual was 10/09/11, okay to fill 90 day supply? Please advise

## 2011-12-25 NOTE — Telephone Encounter (Signed)
Please advise how many refills you would like on the xanax 0.25 mg. You indicated 32 refills. I will call rx in to pharmacy.

## 2011-12-25 NOTE — Telephone Encounter (Signed)
Please see the below regarding xanax 0.25 mg how many refill should pt have?

## 2012-03-21 ENCOUNTER — Other Ambulatory Visit: Payer: Self-pay | Admitting: *Deleted

## 2012-03-21 DIAGNOSIS — E7800 Pure hypercholesterolemia, unspecified: Secondary | ICD-10-CM

## 2012-10-21 ENCOUNTER — Other Ambulatory Visit: Payer: Self-pay | Admitting: Gynecology

## 2012-10-21 DIAGNOSIS — Z1231 Encounter for screening mammogram for malignant neoplasm of breast: Secondary | ICD-10-CM

## 2012-10-25 ENCOUNTER — Encounter: Payer: Self-pay | Admitting: Gynecology

## 2012-10-31 ENCOUNTER — Ambulatory Visit (HOSPITAL_COMMUNITY): Payer: Managed Care, Other (non HMO)

## 2012-12-02 ENCOUNTER — Ambulatory Visit (INDEPENDENT_AMBULATORY_CARE_PROVIDER_SITE_OTHER): Payer: Managed Care, Other (non HMO) | Admitting: Gynecology

## 2012-12-02 ENCOUNTER — Encounter: Payer: Self-pay | Admitting: Gynecology

## 2012-12-02 ENCOUNTER — Ambulatory Visit (HOSPITAL_COMMUNITY)
Admission: RE | Admit: 2012-12-02 | Discharge: 2012-12-02 | Disposition: A | Discharge status: Home / Self Care | Payer: Managed Care, Other (non HMO) | Source: Ambulatory Visit | Attending: Gynecology | Admitting: Gynecology

## 2012-12-02 ENCOUNTER — Telehealth: Payer: Self-pay | Admitting: *Deleted

## 2012-12-02 VITALS — BP 128/84 | Ht 64.0 in | Wt 194.0 lb

## 2012-12-02 DIAGNOSIS — A6000 Herpesviral infection of urogenital system, unspecified: Secondary | ICD-10-CM

## 2012-12-02 DIAGNOSIS — Z8639 Personal history of other endocrine, nutritional and metabolic disease: Secondary | ICD-10-CM

## 2012-12-02 DIAGNOSIS — N951 Menopausal and female climacteric states: Secondary | ICD-10-CM | POA: Insufficient documentation

## 2012-12-02 DIAGNOSIS — Z01419 Encounter for gynecological examination (general) (routine) without abnormal findings: Secondary | ICD-10-CM

## 2012-12-02 DIAGNOSIS — Z1159 Encounter for screening for other viral diseases: Secondary | ICD-10-CM

## 2012-12-02 DIAGNOSIS — R002 Palpitations: Secondary | ICD-10-CM | POA: Insufficient documentation

## 2012-12-02 DIAGNOSIS — Z1231 Encounter for screening mammogram for malignant neoplasm of breast: Secondary | ICD-10-CM | POA: Insufficient documentation

## 2012-12-02 DIAGNOSIS — IMO0002 Reserved for concepts with insufficient information to code with codable children: Secondary | ICD-10-CM

## 2012-12-02 LAB — COMPREHENSIVE METABOLIC PANEL
ALT: 18 U/L (ref 0–35)
AST: 20 U/L (ref 0–37)
Albumin: 4.1 g/dL (ref 3.5–5.2)
Alkaline Phosphatase: 52 U/L (ref 39–117)
BUN: 10 mg/dL (ref 6–23)
CO2: 27 mEq/L (ref 19–32)
Calcium: 9.4 mg/dL (ref 8.4–10.5)
Chloride: 104 mEq/L (ref 96–112)
Creat: 0.56 mg/dL (ref 0.50–1.10)
Glucose, Bld: 88 mg/dL (ref 70–99)
Potassium: 4.3 mEq/L (ref 3.5–5.3)
Sodium: 140 mEq/L (ref 135–145)
Total Bilirubin: 0.9 mg/dL (ref 0.3–1.2)
Total Protein: 6.9 g/dL (ref 6.0–8.3)

## 2012-12-02 LAB — LIPID PANEL
Cholesterol: 231 mg/dL — ABNORMAL HIGH (ref 0–200)
HDL: 61 mg/dL (ref >39)
LDL Cholesterol: 155 mg/dL — ABNORMAL HIGH (ref 0–99)
Total CHOL/HDL Ratio: 3.8 Ratio
Triglycerides: 77 mg/dL (ref <150)
VLDL: 15 mg/dL (ref 0–40)

## 2012-12-02 LAB — CBC WITH DIFFERENTIAL/PLATELET
Basophils Absolute: 0.1 10*3/uL (ref 0.0–0.1)
Basophils Relative: 1 % (ref 0–1)
Eosinophils Absolute: 0.1 10*3/uL (ref 0.0–0.7)
Eosinophils Relative: 1 % (ref 0–5)
HCT: 40.3 % (ref 36.0–46.0)
Hemoglobin: 13.7 g/dL (ref 12.0–15.0)
Lymphocytes Relative: 30 % (ref 12–46)
Lymphs Abs: 2.1 10*3/uL (ref 0.7–4.0)
MCH: 29.1 pg (ref 26.0–34.0)
MCHC: 34 g/dL (ref 30.0–36.0)
MCV: 85.6 fL (ref 78.0–100.0)
Monocytes Absolute: 0.3 10*3/uL (ref 0.1–1.0)
Monocytes Relative: 5 % (ref 3–12)
Neutro Abs: 4.2 10*3/uL (ref 1.7–7.7)
Neutrophils Relative %: 63 % (ref 43–77)
Platelets: 266 10*3/uL (ref 150–400)
RBC: 4.71 MIL/uL (ref 3.87–5.11)
RDW: 13 % (ref 11.5–15.5)
WBC: 6.8 10*3/uL (ref 4.0–10.5)

## 2012-12-02 LAB — URINALYSIS W MICROSCOPIC + REFLEX CULTURE
Bacteria, UA: NONE SEEN
Bilirubin Urine: NEGATIVE
Casts: NONE SEEN
Crystals: NONE SEEN
Glucose, UA: NEGATIVE mg/dL
Hgb urine dipstick: NEGATIVE
Leukocytes, UA: NEGATIVE
Nitrite: NEGATIVE
Protein, ur: NEGATIVE mg/dL
Specific Gravity, Urine: 1.025 (ref 1.005–1.030)
Squamous Epithelial / LPF: NONE SEEN
Urobilinogen, UA: 1 mg/dL (ref 0.0–1.0)
pH: 7 (ref 5.0–8.0)

## 2012-12-02 LAB — HEPATITIS C ANTIBODY: HCV Ab: NEGATIVE

## 2012-12-02 LAB — FOLLICLE STIMULATING HORMONE: FSH: 37.9 m[IU]/mL

## 2012-12-02 LAB — TSH: TSH: 1.64 u[IU]/mL (ref 0.350–4.500)

## 2012-12-02 MED ORDER — PAROXETINE MESYLATE 7.5 MG PO CAPS: 7.5000 mg | ORAL_CAPSULE | Freq: Every evening | ORAL | Status: DC | PRN

## 2012-12-02 MED ORDER — FLUTICASONE-SALMETEROL 45-21 MCG/ACT IN AERO: 2.0000 | INHALATION_SPRAY | Freq: Two times a day (BID) | RESPIRATORY_TRACT | Status: DC

## 2012-12-02 MED ORDER — VALACYCLOVIR HCL 500 MG PO TABS: 500.0000 mg | ORAL_TABLET | Freq: Every day | ORAL | Status: DC

## 2012-12-02 NOTE — Telephone Encounter (Signed)
Referral form faxed with notes to [facility], they will contact office with time and date.

## 2012-12-02 NOTE — Progress Notes (Signed)
Jade [name] April 05, 1961 [id number]   History:    52 y.o.  for annual gyn exam with complaints of vasomotor symptoms at times. She also has been complaining over the past several months of having palpitations with chest discomfort and shortness of breath regardless of physical activity or if she has used her inhaler for asthma. Patient has a strong family history of cardiovascular disease were asked her father had coronary bypass surgery and also had 2 aneurysms 1 coronary and 1 aortic. Patient is not on any hormone replacement therapy. Last year she had a normal FSH.  Patient 1998 had a TVH prior to that she always had normal Pap smears. Patient had her mammogram today and last yea it r was normal. Patient frequently does her breast exam. Patient is on suppressive therapy for HSV. She takes inhaler for asthma when necessary. Her other major complaint has been dyspareunia for which is resolved by using Vaseline. The patient has had a prior history of vitamin D deficiency. She is taking her calcium with vitamin D. Patient had a bone density study several years ago as a result of her chronic steroid use and she had normal bone density study. Patient has not had a screening colonoscopy yet. Patient not sure for Tdap is up-to-date or not but will check with her records.  Past medical history,surgical history, family history and social history were all reviewed and documented in the EPIC chart.  Gynecologic History No LMP recorded. Patient has had a hysterectomy. Contraception: status post hysterectomy Last Pap: 2013. Results were: normal Last mammogram: 2014. Results were: results pending  Obstetric History OB History   Grav Para Term Preterm Abortions TAB SAB Ect Mult Living   1 1 1       1      # Outc Date GA Lbr Len/2nd Wgt Sex Del Anes PTL Lv   1 TRM     F CS  No Yes       ROS: A ROS was performed and pertinent positives and negatives are included in the history.  GENERAL: No fevers or  chills. HEENT: No change in vision, no earache, sore throat or sinus congestion. NECK: No pain or stiffness. CARDIOVASCULAR: No chest pain or pressure. No palpitations. PULMONARY: No shortness of breath, cough or wheeze. GASTROINTESTINAL: No abdominal pain, nausea, vomiting or diarrhea, melena or bright red blood per rectum. GENITOURINARY: No urinary frequency, urgency, hesitancy or dysuria. MUSCULOSKELETAL: No joint or muscle pain, no back pain, no recent trauma. DERMATOLOGIC: No rash, no itching, no lesions. ENDOCRINE: No polyuria, polydipsia, no heat or cold intolerance. No recent change in weight. HEMATOLOGICAL: No anemia or easy bruising or bleeding. NEUROLOGIC: No headache, seizures, numbness, tingling or weakness. PSYCHIATRIC: No depression, no loss of interest in normal activity or change in sleep pattern.     Exam: chaperone present  BP 128/84  Ht 5\' 4"  (1.626 m)  Wt 194 lb (87.998 kg)  BMI 33.28 kg/m2  Body mass index is 33.28 kg/(m^2).  General appearance : Well developed well nourished female. No acute distress HEENT: Neck supple, trachea midline, no carotid bruits, no thyroidmegaly Lungs: Clear to auscultation, no rhonchi or wheezes, or rib retractions  Heart: Regular rate and rhythm, no murmurs or gallops Breast:Examined in sitting and supine position were symmetrical in appearance, no palpable masses or tenderness,  no skin retraction, no nipple inversion, no nipple discharge, no skin discoloration, no axillary or supraclavicular lymphadenopathy Abdomen: no palpable masses or tenderness, no rebound or guarding Extremities: no  edema or skin discoloration or tenderness  Pelvic:  Bartholin, Urethra, Skene Glands: Within normal limits             Vagina: No gross lesions or discharge  Cervix: absent  Uterus Absent  Adnexa  Without masses or tenderness  Anus and perineum  normal   Rectovaginal  normal sphincter tone without palpated masses or tenderness             Hemoccult  Course provided     Assessment/Plan:  52 y.o. female for annual exam with menopausal symptoms. Patient does not want to go on hormone replacement therapy. We discussed nonhormonal treatment for vasomotor symptoms such as Brisdelle 7.5 mg daily. Samples were provided. Risks benefits and pros and cons of the medication were discussed. Patient also was given prescription refill for her Valtrex 500 mg which she takes daily for HSV suppression. Patient also given prescription refill for her Advair inhaler to use when necessary. She will schedule a bone density study to compare with several years ago because of her chronic steroid use. She was reminded to take her calcium and vitamin D on a regular basis along with exercise for osteoporosis prevention. No Pap smear was done today the new guidelines were discussed. The following labs were work today: Fasting lipid profile, comprehensive metabolic panel, TSH, CBC, FSH, and urinalysis along with vitamin D.  New CDC guidelines is recommending patients be tested once in her lifetime for hepatitis C antibody who were born between [birth year] through 1963. This was discussed with the patient today and has agreed to be tested today.  She will be referred to the cardiologist for further evaluation in reference to her palpitations and strong family history cardiovascular disease. Patient also will make an appointment with gastroenterologist for colonoscopy. Hemoccult cores were provided pertinent to the office for testing.  Ok Edwards MD, 9:42 AM 12/02/2012

## 2012-12-02 NOTE — Patient Instructions (Addendum)

## 2012-12-02 NOTE — Telephone Encounter (Signed)
Message copied by Aura Camps on Fri Dec 02, 2012 12:03 PM ------      Message from: Keenan Bachelor      Created: Fri Dec 02, 2012 10:36 AM                   ----- Message -----         From: Ok Edwards, MD         Sent: 12/02/2012   9:50 AM           To: Christie Nottingham, please make appointment for this patient with Dr. York Ram cardiologist for consultation as a result of patient's palpitations, shortness of breath and tightness in her chest as well as family history of cardiovascular disease. ------

## 2012-12-03 LAB — VITAMIN D 25 HYDROXY (VIT D DEFICIENCY, FRACTURES): Vit D, 25-Hydroxy: 45 ng/mL (ref 30–89)

## 2012-12-05 ENCOUNTER — Encounter: Payer: Self-pay | Admitting: Gynecology

## 2012-12-05 NOTE — Telephone Encounter (Signed)
appt on 12/16/12 @ 3:30 pm

## 2012-12-07 ENCOUNTER — Telehealth: Payer: Self-pay | Admitting: *Deleted

## 2012-12-07 NOTE — Telephone Encounter (Signed)
[employer] home delivery faxed over prior authorization for Briselle 7.5 mg 1 po at bedtime. Form filled out and faxed to #

## 2012-12-08 NOTE — Telephone Encounter (Signed)
Brisdelle 7.5 capsule has been approved by [employer].

## 2012-12-16 ENCOUNTER — Ambulatory Visit: Payer: Managed Care, Other (non HMO) | Admitting: Cardiovascular Disease

## 2012-12-22 ENCOUNTER — Encounter: Payer: Self-pay | Admitting: Gynecology

## 2012-12-30 ENCOUNTER — Ambulatory Visit: Payer: Managed Care, Other (non HMO) | Admitting: Cardiovascular Disease

## 2013-01-05 ENCOUNTER — Ambulatory Visit: Payer: Managed Care, Other (non HMO) | Admitting: Cardiovascular Disease

## 2013-01-18 ENCOUNTER — Encounter: Payer: Self-pay | Admitting: *Deleted

## 2013-01-18 ENCOUNTER — Telehealth: Payer: Self-pay | Admitting: *Deleted

## 2013-01-18 NOTE — Telephone Encounter (Signed)
Pt called requesting a note be faxed to her to give her insurance company stating that she has had annual done this year. Letter will be faxed to pt at [phone] per request.

## 2013-01-23 ENCOUNTER — Ambulatory Visit: Payer: Managed Care, Other (non HMO) | Admitting: Cardiovascular Disease

## 2013-01-25 ENCOUNTER — Encounter: Payer: Self-pay | Admitting: Cardiovascular Disease

## 2013-01-25 ENCOUNTER — Ambulatory Visit (INDEPENDENT_AMBULATORY_CARE_PROVIDER_SITE_OTHER): Payer: Managed Care, Other (non HMO) | Admitting: Cardiovascular Disease

## 2013-01-25 VITALS — BP 102/86 | HR 68 | Ht 67.0 in | Wt 200.0 lb

## 2013-01-25 DIAGNOSIS — R002 Palpitations: Secondary | ICD-10-CM

## 2013-01-25 DIAGNOSIS — R0602 Shortness of breath: Secondary | ICD-10-CM

## 2013-01-25 MED ORDER — ZOLPIDEM TARTRATE ER 6.25 MG PO TBCR: 6.2500 mg | EXTENDED_RELEASE_TABLET | Freq: Every evening | ORAL | Status: DC | PRN

## 2013-01-25 NOTE — Assessment & Plan Note (Addendum)
She has developed palpitations in the last 3 months. They occur several times a week and last off-and-on all day. They're associated with some fatigue but no presyncope. She denies use of stimulants. Thyroid function tests were obtained by Dr. Lily Peer and were normal.I am going to get a 2-D echocardiogram and a one-month event monitor. I will see her back To that for further evaluation.

## 2013-01-25 NOTE — Patient Instructions (Addendum)
  We will see you back in follow up after the tests in 3-4 weeks  Dr Allyson Sabal has ordered an one month event monitor and an echocardiogram  Dr Allyson Sabal has provided you with a prescription for Ambien CR to take as needed at night

## 2013-01-25 NOTE — Progress Notes (Signed)
01/25/2013 Jade [name]   [birth year]  [id number]  Primary Physician Pcp Not In System Primary Cardiologist: [name] Roseanne Reno   HPI:  Ms. Gamino is a 52 year old moderately overweight married Caucasian female mother of one adult child works as a Armed forces operational officer in [place]. She was referred by Dr. [name] for dilation or palpitations. She has no cardiac risk factors other than a father who recently passed away and in his 53s. CAD. She has never had a heart attack or stroke patient gets occasional atypical chest pain. She's had palpitations last several months and occurs several times a week at the left upper all-day off-and-on. They are occasionally associated with shortness of breath but she does have asthma as well.   Current Outpatient Prescriptions  Medication Sig Dispense Refill  . cetirizine (ZYRTEC) 10 MG tablet Take 10 mg by mouth daily.      . fexofenadine (ALLEGRA) 30 MG tablet Take 30 mg by mouth 2 (two) times daily.        . fluticasone-salmeterol (ADVAIR HFA) 115-21 MCG/ACT inhaler Inhale 2 puffs into the lungs 2 (two) times daily.  1 Inhaler  2  . ibuprofen (ADVIL,MOTRIN) 200 MG tablet Take 200 mg by mouth every 6 (six) hours as needed.        . Ibuprofen-Diphenhydramine Cit (ADVIL PM PO) Take 2 tablets by mouth at bedtime as needed.      . valACYclovir (VALTREX) 500 MG tablet Take 1 tablet (500 mg total) by mouth daily.  90 tablet  3   No current facility-administered medications for this visit.    Allergies  Allergen Reactions  . Ciprocin-Fluocin-Procin [Fluocinolone Acetonide]   . Sulfa Antibiotics     History   Social History  . Marital Status: Married    Spouse Name: N/A    Number of Children: N/A  . Years of Education: N/A   Occupational History  . Not on file.   Social History Main Topics  . Smoking status: Never Smoker   . Smokeless tobacco: Never Used  . Alcohol Use: Yes     Comment: occ  . Drug Use: No    . Sexual Activity: Yes   Other Topics Concern  . Not on file   Social History Narrative  . No narrative on file     Review of Systems: General: negative for chills, fever, night sweats or weight changes.  Cardiovascular: negative for chest pain, dyspnea on exertion, edema, orthopnea, palpitations, paroxysmal nocturnal dyspnea or shortness of breath Dermatological: negative for rash Respiratory: negative for cough or wheezing Urologic: negative for hematuria Abdominal: negative for nausea, vomiting, diarrhea, bright red blood per rectum, melena, or hematemesis Neurologic: negative for visual changes, syncope, or dizziness All other systems reviewed and are otherwise negative except as noted above.    Blood pressure 102/86, pulse 68, height 5\' 7"  (1.702 m), weight 200 lb (90.719 kg).  General appearance: alert and no distress Neck: no adenopathy, no carotid bruit, no JVD, supple, symmetrical, trachea midline and thyroid not enlarged, symmetric, no tenderness/mass/nodules Lungs: clear to auscultation bilaterally Heart: regular rate and rhythm, S1, S2 normal, no murmur, click, rub or gallop Abdomen: soft, non-tender; bowel sounds normal; no masses,  no organomegaly Extremities: extremities normal, atraumatic, no cyanosis or edema Pulses: 2+ and symmetric  EKG normal sinus rhythm at 68 without ST or T wave changes  ASSESSMENT AND PLAN:   Palpitations She has developed palpitations in the last 3 months. They occur several  times a week and last off-and-on all day. They're associated with some fatigue but no presyncope. She denies use of stimulants. Thyroid function tests were obtained by Dr. Lily Peer and were normal.      [name] [facility], [facility] 01/25/2013 4:27 PM

## 2013-02-14 ENCOUNTER — Ambulatory Visit (HOSPITAL_COMMUNITY)
Admission: RE | Admit: 2013-02-14 | Discharge: 2013-02-14 | Disposition: A | Discharge status: Home / Self Care | Payer: Managed Care, Other (non HMO) | Source: Ambulatory Visit | Attending: Cardiovascular Disease | Admitting: Cardiovascular Disease

## 2013-02-14 ENCOUNTER — Other Ambulatory Visit: Payer: Self-pay | Admitting: Cardiovascular Disease

## 2013-02-14 DIAGNOSIS — R002 Palpitations: Secondary | ICD-10-CM | POA: Insufficient documentation

## 2013-02-14 DIAGNOSIS — R0602 Shortness of breath: Secondary | ICD-10-CM

## 2013-02-14 NOTE — Progress Notes (Signed)
2D Echo Performed 02/14/2013    Kimo Bancroft, RCS  

## 2013-02-16 ENCOUNTER — Telehealth: Payer: Self-pay | Admitting: Cardiovascular Disease

## 2013-02-16 NOTE — Telephone Encounter (Signed)
[facility] submitted ambien rx refill twice  [facility] says [facility] still does not [facility] yet.  Please call

## 2013-02-17 ENCOUNTER — Other Ambulatory Visit: Payer: Self-pay | Admitting: *Deleted

## 2013-02-17 MED ORDER — ZOLPIDEM TARTRATE ER 12.5 MG PO TBCR: 12.5000 mg | EXTENDED_RELEASE_TABLET | Freq: Every evening | ORAL | Status: DC | PRN

## 2013-02-17 NOTE — Telephone Encounter (Signed)
Returned call and pt verified x 2.  Pt stated she has been trying to get a refill on Ambien and the pharmacy has not heard anything back.  RN asked pt did she receive a prescription at [facility] on 10.1.14 and pt stated she did.  Pt informed Rx was written for 30-day supply and she should not be out by now.  Pt stated she has been waking up in the middle of the night and taking an extra pill.  Pt informed this is a controlled substance normally given for only refilled every 30 days.  Pt verbalized understanding.  Stated she thought Dr. Allyson Sabal mentioned she was given a controlled release tab and it should help her stay asleep through the night.  Pt informed Dr. Rockie Neighbours will be notified r/t refilling.  Pt verbalized understanding and agreed w/ plan.  Message forwarded to Dr. Rockie Neighbours, RN to review and refill, if appropriate.

## 2013-02-17 NOTE — Telephone Encounter (Signed)
i spoke with Jade [name].  She is not sleeping through the night.  Sometimes she gets up and has to take another 1/2 tablet of the Ambien 6.25mg .  I spoke with Dr Allyson Sabal.  We can increase the Ambien to 12.5mg  at night prn.

## 2013-02-17 NOTE — Telephone Encounter (Signed)
Dr Allyson Sabal authorized to increase the Ambien CR to 12.5mg  as needed for insomnia.  Pt complained of waking up in the middle of the night and having to take more Ambien when she was only prescribed 6.25mg  daily.  RX called in

## 2013-02-18 ENCOUNTER — Encounter: Payer: Self-pay | Admitting: *Deleted

## 2013-02-21 ENCOUNTER — Encounter: Payer: Self-pay | Admitting: *Deleted

## 2013-02-23 ENCOUNTER — Encounter: Payer: Self-pay | Admitting: Cardiovascular Disease

## 2013-02-23 ENCOUNTER — Ambulatory Visit (INDEPENDENT_AMBULATORY_CARE_PROVIDER_SITE_OTHER): Payer: Managed Care, Other (non HMO) | Admitting: Cardiovascular Disease

## 2013-02-23 VITALS — BP 100/64 | HR 72 | Ht 67.0 in | Wt 200.0 lb

## 2013-02-23 DIAGNOSIS — R079 Chest pain, unspecified: Secondary | ICD-10-CM | POA: Insufficient documentation

## 2013-02-23 DIAGNOSIS — R002 Palpitations: Secondary | ICD-10-CM

## 2013-02-23 NOTE — Assessment & Plan Note (Signed)
Jade [name] [name] for 2 weeks and there were was no evidence of PVCs PACs or tachyarrhythmias.2-D echo is normal

## 2013-02-23 NOTE — Assessment & Plan Note (Signed)
She's had 3 episodes of chest pain since I saw her last period when awakened her from sleep. Given her family history I'm going to get an exercise Myoview stress test to rule out an ischemic etiology.

## 2013-02-23 NOTE — Progress Notes (Signed)
02/23/2013 Jade [name]   Feb 08, 1961  [id number]  Primary Physician Pcp Not In System Primary Cardiologist: [name] Jade [name]   HPI:   [name] is a 52 year old moderately overweight married Caucasian female mother of one adult child works as a Armed forces operational officer in [place]. She was referred by Dr. [name] for dilation or palpitations. She has no cardiac risk factors other than a father who recently passed away and in his 6s. CAD. She has never had a heart attack or stroke patient gets occasional atypical chest pain. She's had palpitations last several months and occurs several times a week at the left upper all-day off-and-on. They are occasionally associated with shortness of breath but she does have asthma as well. Since I saw her a month ago she had a monitor she worked for 2 weeks that was unrevealing. 2-D echo was normal. She relates 3 episodes of substernal chest pain which are worrisome. One in particular awakened her from sleep.    Current Outpatient Prescriptions  Medication Sig Dispense Refill  . acetaminophen (TYLENOL) 325 MG tablet Take 650 mg by mouth every 6 (six) hours as needed for pain.      . valACYclovir (VALTREX) 500 MG tablet Take 1 tablet (500 mg total) by mouth daily.  90 tablet  3  . zolpidem (AMBIEN CR) 12.5 MG CR tablet Take 1 tablet (12.5 mg total) by mouth at bedtime as needed for sleep.  30 tablet  0  . cetirizine (ZYRTEC) 10 MG tablet Take 10 mg by mouth daily. Has stop taking @ this time but might take in future if needed      . fluticasone-salmeterol (ADVAIR HFA) 115-21 MCG/ACT inhaler Inhale 2 puffs into the lungs 2 (two) times daily.  1 Inhaler  2   No current facility-administered medications for this visit.    Allergies  Allergen Reactions  . Ciprocin-Fluocin-Procin [Fluocinolone Acetonide]   . Sulfa Antibiotics     History   Social History  . Marital Status: Married    Spouse Name: N/A    Number of  Children: N/A  . Years of Education: N/A   Occupational History  . Not on file.   Social History Main Topics  . Smoking status: Never Smoker   . Smokeless tobacco: Never Used  . Alcohol Use: Yes     Comment: occ  . Drug Use: No  . Sexual Activity: Yes   Other Topics Concern  . Not on file   Social History Narrative  . No narrative on file     Review of Systems: General: negative for chills, fever, night sweats or weight changes.  Cardiovascular: negative for chest pain, dyspnea on exertion, edema, orthopnea, palpitations, paroxysmal nocturnal dyspnea or shortness of breath Dermatological: negative for rash Respiratory: negative for cough or wheezing Urologic: negative for hematuria Abdominal: negative for nausea, vomiting, diarrhea, bright red blood per rectum, melena, or hematemesis Neurologic: negative for visual changes, syncope, or dizziness All other systems reviewed and are otherwise negative except as noted above.    Blood pressure 100/64, pulse 72, height 5\' 7"  (1.702 m), weight 200 lb (90.719 kg).  General appearance: alert and no distress Neck: no adenopathy, no carotid bruit, no JVD, supple, symmetrical, trachea midline and thyroid not enlarged, symmetric, no tenderness/mass/nodules Lungs: clear to auscultation bilaterally Heart: regular rate and rhythm, S1, S2 normal, no murmur, click, rub or gallop Extremities: extremities normal, atraumatic, no cyanosis or edema  EKG not performed today  ASSESSMENT  AND PLAN:   Palpitations She Jade [name] for 2 weeks and there were was no evidence of PVCs PACs or tachyarrhythmias.2-D echo is normal  Chest pain She's had 3 episodes of chest pain since I saw her last period when awakened her from sleep. Given her family history I'm going to get an exercise Myoview stress test to rule out an ischemic etiology.      [name] FACP,FACC,FAHA, [facility] 02/23/2013 4:45 PM

## 2013-02-23 NOTE — Patient Instructions (Signed)
  We will see you back in follow up after the test  Dr Allyson Sabal has ordered [facility] stress test

## 2013-02-27 ENCOUNTER — Encounter: Payer: Self-pay | Admitting: Cardiovascular Disease

## 2013-03-01 ENCOUNTER — Ambulatory Visit (INDEPENDENT_AMBULATORY_CARE_PROVIDER_SITE_OTHER): Payer: Managed Care, Other (non HMO)

## 2013-03-01 ENCOUNTER — Other Ambulatory Visit: Payer: Self-pay | Admitting: Gynecology

## 2013-03-01 DIAGNOSIS — Z13820 Encounter for screening for osteoporosis: Secondary | ICD-10-CM

## 2013-03-01 DIAGNOSIS — N951 Menopausal and female climacteric states: Secondary | ICD-10-CM

## 2013-03-02 ENCOUNTER — Other Ambulatory Visit: Payer: Self-pay

## 2013-03-07 ENCOUNTER — Telehealth (HOSPITAL_COMMUNITY): Payer: Self-pay | Admitting: *Deleted

## 2013-03-10 ENCOUNTER — Ambulatory Visit (HOSPITAL_COMMUNITY)
Admission: RE | Admit: 2013-03-10 | Discharge: 2013-03-10 | Disposition: A | Discharge status: Home / Self Care | Payer: Managed Care, Other (non HMO) | Source: Ambulatory Visit | Attending: Cardiovascular Disease | Admitting: Cardiovascular Disease

## 2013-03-10 DIAGNOSIS — R079 Chest pain, unspecified: Secondary | ICD-10-CM

## 2013-03-10 MED ORDER — TECHNETIUM TC 99M SESTAMIBI GENERIC - CARDIOLITE
30.6000 | Freq: Once | INTRAVENOUS | Status: AC | PRN
  Administered 2013-03-10: 31 via INTRAVENOUS

## 2013-03-10 MED ORDER — TECHNETIUM TC 99M SESTAMIBI GENERIC - CARDIOLITE
10.5000 | Freq: Once | INTRAVENOUS | Status: AC | PRN
  Administered 2013-03-10: 11 via INTRAVENOUS

## 2013-03-10 NOTE — Procedures (Addendum)
[facility] [address] [place] [address] [place] [address] [phone]  Cardiology Nuclear Med Study  Jade [name] is a 52 y.o. female     MRN : [id number]     DOB: [birth year]  Procedure Date: 03/10/2013  Nuclear Med Background Indication for Stress Test:  Evaluation for Ischemia History:  Asthma Cardiac Risk Factors: Family History - CAD, Lipids and Obesity  Symptoms:  Chest Pain, Dizziness, Fatigue, Light-Headedness, Near Syncope, Palpitations and SOB   Nuclear Pre-Procedure Caffeine/Decaff Intake:  7:00pm NPO After: 5:30am   IV Site: R Hand  IV 0.9% NS with Angio Cath:  22g  Chest Size (in):  n/a IV Started by: Emmit Pomfret, RN  Height: 5\' 7"  (1.702 m)  Cup Size: D  BMI:  Body mass index is 31.32 kg/(m^2). Weight:  200 lb (90.719 kg)   Tech Comments:  n/a    Nuclear Med Study 1 or 2 day study: 1 day  Stress Test Type:  Stress  Order Authorizing Provider:  Nanetta Batty, MD   Resting Radionuclide: Technetium 50m Sestamibi  Resting Radionuclide Dose: 10.5 mCi   Stress Radionuclide:  Technetium 33m Sestamibi  Stress Radionuclide Dose: 30.6 mCi           Stress Protocol Rest HR: 70 Stress HR: 166  Rest BP: 99/76 Stress BP: 120/93  Exercise Time (min): 10 METS: 11.7   Predicted Max HR: 170 bpm % Max HR: 97.65 bpm Rate Pressure Product: 21308  Dose of Adenosine (mg):  n/a Dose of Lexiscan: n/a mg  Dose of Atropine (mg): n/a Dose of Dobutamine: n/a mcg/kg/min (at max HR)  Stress Test Technologist: Esperanza Sheets, CCT Nuclear Technologist: Gonzella Lex, CNMT   Rest Procedure:  Myocardial perfusion imaging was performed at rest 45 minutes following the intravenous administration of Technetium 11m Sestamibi. Stress Procedure:  The patient performed treadmill exercise using a Bruce  Protocol for 10 minutes. The patient stopped due to SOB and fatigue and denied any chest pain.  There were no significant ST-T wave changes.   Technetium 51m Sestamibi was injected at peak exercise and myocardial perfusion imaging was performed after a brief delay.  Transient Ischemic Dilatation (Normal <1.22):  0.80 Lung/Heart Ratio (Normal <0.45):  0.35 QGS EDV:  77 ml QGS ESV:  30 ml LV Ejection Fraction: 61%     Rest ECG: NSR - Normal EKG  Stress ECG: No significant change from baseline ECG  QPS Raw Data Images:  Normal; no motion artifact; normal heart/lung ratio. Stress Images:  Normal homogeneous uptake in all areas of the myocardium. Rest Images:  Normal homogeneous uptake in all areas of the myocardium. Subtraction (SDS):  No evidence of ischemia. LV Wall Motion:  NL LV Function; NL Wall Motion  Impression Exercise Capacity:  Excellent exercise capacity. BP Response:  Normal blood pressure response. Clinical Symptoms:  No significant symptoms noted. ECG Impression:  No significant ST segment change suggestive of ischemia. Comparison with Prior Nuclear Study: No previous nuclear study performed   Overall Impression:  Normal stress nuclear study.   Thurmon Fair, MD  03/10/2013 12:31 PM

## 2013-03-13 ENCOUNTER — Encounter: Payer: Self-pay | Admitting: *Deleted

## 2013-03-16 ENCOUNTER — Ambulatory Visit (INDEPENDENT_AMBULATORY_CARE_PROVIDER_SITE_OTHER): Payer: Managed Care, Other (non HMO) | Admitting: Cardiovascular Disease

## 2013-03-16 ENCOUNTER — Encounter: Payer: Self-pay | Admitting: Cardiovascular Disease

## 2013-03-16 VITALS — BP 101/74 | HR 76 | Ht 67.0 in | Wt 203.0 lb

## 2013-03-16 DIAGNOSIS — R079 Chest pain, unspecified: Secondary | ICD-10-CM

## 2013-03-16 DIAGNOSIS — R002 Palpitations: Secondary | ICD-10-CM

## 2013-03-16 NOTE — Progress Notes (Signed)
Jade [name] had a 2-D echocardiogram which was normal as was a Myoview stress test. She continues to be symptomatic. I reassured her that the likelihood that her symptoms are cardiac in nature was small but could not rule out with 100% certainty that this was related to CAD. She is otherwise no cardiac risk factors other than family history. I suggested that she undergo cardiac catheterization to rule this out with 100% certainty and she has decided to think about this. She does not particularly C. Her back as needed.  [name], M.D., [facility] [address] [place], [place]  [address]  [phone] 03/16/2013 5:11 PM

## 2013-03-16 NOTE — Assessment & Plan Note (Signed)
The event  monitor was completely normal

## 2013-03-16 NOTE — Patient Instructions (Signed)
Contact me, Jade [name], if you want to proceed with the cardiac catheterization.   Follow up with Dr Allyson Sabal depends upon your decision to proceed with cath

## 2013-03-16 NOTE — Assessment & Plan Note (Signed)
She had a Myoview stress test that was normal a 2-D echocardiogram that was normal as well. She continues to have episodes of chest heaviness and shortness of breath and awake her from sleep at night. I offered her cardiac catheterization to definitively rule out CAD which is thinking about. Otherwise she will return to Dr. Lily Peer for further evaluation

## 2013-03-17 ENCOUNTER — Telehealth: Payer: Self-pay | Admitting: Cardiovascular Disease

## 2013-03-17 ENCOUNTER — Encounter: Payer: Self-pay | Admitting: Cardiovascular Disease

## 2013-03-17 ENCOUNTER — Other Ambulatory Visit: Payer: Self-pay | Admitting: *Deleted

## 2013-03-17 DIAGNOSIS — R079 Chest pain, unspecified: Secondary | ICD-10-CM

## 2013-03-17 DIAGNOSIS — Z01818 Encounter for other preprocedural examination: Secondary | ICD-10-CM

## 2013-03-17 DIAGNOSIS — Z79899 Other long term (current) drug therapy: Secondary | ICD-10-CM

## 2013-03-17 NOTE — Telephone Encounter (Signed)
Please call about procedure discussed yesterday. Wants to talk to Samara Deist about it.

## 2013-03-17 NOTE — Telephone Encounter (Signed)
Message forwarded to K. Vogel, RN.  

## 2013-03-17 NOTE — Telephone Encounter (Signed)
I spoke with patient and reviewed the cardiac cath procedure.  She is agreeable and ready to proceed with scheduling.  I will place the orders and send to the scheduler.

## 2013-03-21 ENCOUNTER — Encounter: Payer: Self-pay | Admitting: *Deleted

## 2013-03-21 ENCOUNTER — Telehealth: Payer: Self-pay | Admitting: *Deleted

## 2013-03-21 DIAGNOSIS — R079 Chest pain, unspecified: Secondary | ICD-10-CM

## 2013-03-21 DIAGNOSIS — Z79899 Other long term (current) drug therapy: Secondary | ICD-10-CM

## 2013-03-21 NOTE — Telephone Encounter (Signed)
Dr Allyson Sabal called the insurance company to try to get them to approve the cardiac cath.  The insurance company would not approve the cath, but they would approve a cardiac CTA.  Dr Allyson Sabal wants to proceed with the cardiac CTA.  I have left a message for Ms Kamiya to contact me so that I can discuss this with her.

## 2013-03-21 NOTE — Telephone Encounter (Signed)
Returning your call. °

## 2013-03-21 NOTE — Telephone Encounter (Signed)
I spoke with patient and is agreeeable to proceed with the CT and cancel the cath.  I gave her info to call and find out a cost estimate.  [phone] CPT [phone].  Message sent the the scheduler for scheduling.

## 2013-03-22 ENCOUNTER — Encounter: Payer: Self-pay | Admitting: *Deleted

## 2013-03-31 ENCOUNTER — Ambulatory Visit (HOSPITAL_COMMUNITY): Payer: Managed Care, Other (non HMO)

## 2013-04-06 ENCOUNTER — Ambulatory Visit (HOSPITAL_COMMUNITY)
Admission: RE | Admit: 2013-04-06 | Payer: Managed Care, Other (non HMO) | Source: Ambulatory Visit | Admitting: Cardiovascular Disease

## 2013-04-06 ENCOUNTER — Encounter (HOSPITAL_COMMUNITY): Admission: RE | Payer: Self-pay | Source: Ambulatory Visit

## 2013-04-06 ENCOUNTER — Ambulatory Visit (HOSPITAL_COMMUNITY)
Admission: RE | Admit: 2013-04-06 | Discharge: 2013-04-06 | Disposition: A | Discharge status: Home / Self Care | Payer: Managed Care, Other (non HMO) | Source: Ambulatory Visit | Attending: Cardiovascular Disease | Admitting: Cardiovascular Disease

## 2013-04-06 DIAGNOSIS — R079 Chest pain, unspecified: Secondary | ICD-10-CM | POA: Insufficient documentation

## 2013-04-06 SURGERY — LEFT HEART CATHETERIZATION WITH CORONARY ANGIOGRAM
Anesthesia: LOCAL

## 2013-04-06 MED ORDER — NITROGLYCERIN 0.4 MG SL SUBL
0.4000 mg | SUBLINGUAL_TABLET | SUBLINGUAL | Status: DC | PRN
  Filled 2013-04-06: qty 25

## 2013-04-06 MED ORDER — METOPROLOL TARTRATE 1 MG/ML IV SOLN
5.0000 mg | INTRAVENOUS | Status: DC | PRN
  Filled 2013-04-06: qty 5

## 2013-04-06 MED ORDER — METOPROLOL TARTRATE 1 MG/ML IV SOLN
INTRAVENOUS | Status: AC
  Administered 2013-04-06: 2.5 mg
  Filled 2013-04-06: qty 15

## 2013-04-06 MED ORDER — IOHEXOL 350 MG/ML SOLN
80.0000 mL | Freq: Once | INTRAVENOUS | Status: AC | PRN
  Administered 2013-04-06: 80 mL via INTRAVENOUS

## 2013-04-06 MED ORDER — NITROGLYCERIN 0.4 MG SL SUBL
SUBLINGUAL_TABLET | SUBLINGUAL | Status: AC
  Administered 2013-04-06: 14:00:00
  Filled 2013-04-06: qty 25

## 2013-04-10 ENCOUNTER — Encounter: Payer: Self-pay | Admitting: *Deleted

## 2013-04-10 ENCOUNTER — Telehealth: Payer: Self-pay | Admitting: Cardiovascular Disease

## 2013-04-10 NOTE — Telephone Encounter (Signed)
Returned call and pt verified x 2.  Pt informed she will be notified once result reviewed by MD.  Also informed her MyChart account is active and her results will likely be sent in that format.  Pt verbalized understanding and agreed w/ plan.

## 2013-04-10 NOTE — Telephone Encounter (Signed)
Had CT of heart on Thursday at [facility].  Wants to get results.  She thought someone was supposed to call Friday.  Please call

## 2013-11-07 ENCOUNTER — Other Ambulatory Visit: Payer: Self-pay | Admitting: Gynecology

## 2013-11-07 DIAGNOSIS — Z1231 Encounter for screening mammogram for malignant neoplasm of breast: Secondary | ICD-10-CM

## 2013-11-10 ENCOUNTER — Ambulatory Visit: Payer: Managed Care, Other (non HMO) | Admitting: Family Medicine

## 2013-12-15 ENCOUNTER — Ambulatory Visit (HOSPITAL_COMMUNITY): Payer: Managed Care, Other (non HMO)

## 2013-12-15 ENCOUNTER — Encounter: Payer: Self-pay | Admitting: Gynecology

## 2013-12-15 ENCOUNTER — Ambulatory Visit (INDEPENDENT_AMBULATORY_CARE_PROVIDER_SITE_OTHER): Payer: Managed Care, Other (non HMO) | Admitting: Gynecology

## 2013-12-15 ENCOUNTER — Ambulatory Visit: Payer: Managed Care, Other (non HMO) | Admitting: Family Medicine

## 2013-12-15 ENCOUNTER — Ambulatory Visit (HOSPITAL_COMMUNITY)
Admission: RE | Admit: 2013-12-15 | Discharge: 2013-12-15 | Disposition: A | Discharge status: Home / Self Care | Payer: Managed Care, Other (non HMO) | Source: Ambulatory Visit | Attending: Gynecology | Admitting: Gynecology

## 2013-12-15 ENCOUNTER — Ambulatory Visit (INDEPENDENT_AMBULATORY_CARE_PROVIDER_SITE_OTHER): Payer: Managed Care, Other (non HMO)

## 2013-12-15 VITALS — BP 110/70 | Ht 66.75 in | Wt 204.0 lb

## 2013-12-15 DIAGNOSIS — N949 Unspecified condition associated with female genital organs and menstrual cycle: Secondary | ICD-10-CM

## 2013-12-15 DIAGNOSIS — N83209 Unspecified ovarian cyst, unspecified side: Secondary | ICD-10-CM

## 2013-12-15 DIAGNOSIS — R102 Pelvic and perineal pain: Secondary | ICD-10-CM

## 2013-12-15 DIAGNOSIS — Z8639 Personal history of other endocrine, nutritional and metabolic disease: Secondary | ICD-10-CM

## 2013-12-15 DIAGNOSIS — R635 Abnormal weight gain: Secondary | ICD-10-CM

## 2013-12-15 DIAGNOSIS — N83202 Unspecified ovarian cyst, left side: Secondary | ICD-10-CM

## 2013-12-15 DIAGNOSIS — Z01419 Encounter for gynecological examination (general) (routine) without abnormal findings: Secondary | ICD-10-CM

## 2013-12-15 DIAGNOSIS — Z1231 Encounter for screening mammogram for malignant neoplasm of breast: Secondary | ICD-10-CM | POA: Diagnosis not present

## 2013-12-15 LAB — COMPREHENSIVE METABOLIC PANEL
ALT: 13 U/L (ref 0–35)
AST: 18 U/L (ref 0–37)
Albumin: 4.4 g/dL (ref 3.5–5.2)
Alkaline Phosphatase: 52 U/L (ref 39–117)
BUN: 13 mg/dL (ref 6–23)
CO2: 29 mEq/L (ref 19–32)
Calcium: 9.5 mg/dL (ref 8.4–10.5)
Chloride: 104 mEq/L (ref 96–112)
Creat: 0.64 mg/dL (ref 0.50–1.10)
Glucose, Bld: 101 mg/dL — ABNORMAL HIGH (ref 70–99)
Potassium: 4.4 mEq/L (ref 3.5–5.3)
Sodium: 142 mEq/L (ref 135–145)
Total Bilirubin: 0.7 mg/dL (ref 0.2–1.2)
Total Protein: 7.1 g/dL (ref 6.0–8.3)

## 2013-12-15 LAB — CBC WITH DIFFERENTIAL/PLATELET
Basophils Absolute: 0.1 10*3/uL (ref 0.0–0.1)
Basophils Relative: 1 % (ref 0–1)
Eosinophils Absolute: 0.2 10*3/uL (ref 0.0–0.7)
Eosinophils Relative: 3 % (ref 0–5)
HCT: 39.2 % (ref 36.0–46.0)
Hemoglobin: 13.5 g/dL (ref 12.0–15.0)
Lymphocytes Relative: 41 % (ref 12–46)
Lymphs Abs: 2.4 10*3/uL (ref 0.7–4.0)
MCH: 28.8 pg (ref 26.0–34.0)
MCHC: 34.4 g/dL (ref 30.0–36.0)
MCV: 83.8 fL (ref 78.0–100.0)
Monocytes Absolute: 0.4 10*3/uL (ref 0.1–1.0)
Monocytes Relative: 7 % (ref 3–12)
Neutro Abs: 2.8 10*3/uL (ref 1.7–7.7)
Neutrophils Relative %: 48 % (ref 43–77)
Platelets: 237 10*3/uL (ref 150–400)
RBC: 4.68 MIL/uL (ref 3.87–5.11)
RDW: 13.4 % (ref 11.5–15.5)
WBC: 5.8 10*3/uL (ref 4.0–10.5)

## 2013-12-15 LAB — URINALYSIS W MICROSCOPIC + REFLEX CULTURE
Bacteria, UA: NONE SEEN
Bilirubin Urine: NEGATIVE
Casts: NONE SEEN
Crystals: NONE SEEN
Glucose, UA: NEGATIVE mg/dL
Hgb urine dipstick: NEGATIVE
Ketones, ur: NEGATIVE mg/dL
Leukocytes, UA: NEGATIVE
Nitrite: NEGATIVE
Protein, ur: NEGATIVE mg/dL
Specific Gravity, Urine: 1.023 (ref 1.005–1.030)
Squamous Epithelial / LPF: NONE SEEN
Urobilinogen, UA: 0.2 mg/dL (ref 0.0–1.0)
pH: 6.5 (ref 5.0–8.0)

## 2013-12-15 LAB — LIPID PANEL
Cholesterol: 229 mg/dL — ABNORMAL HIGH (ref 0–200)
HDL: 62 mg/dL (ref >39)
LDL Cholesterol: 148 mg/dL — ABNORMAL HIGH (ref 0–99)
Total CHOL/HDL Ratio: 3.7 Ratio
Triglycerides: 97 mg/dL (ref <150)
VLDL: 19 mg/dL (ref 0–40)

## 2013-12-15 LAB — TSH: TSH: 1.101 u[IU]/mL (ref 0.350–4.500)

## 2013-12-15 MED ORDER — NONFORMULARY OR COMPOUNDED ITEM: Status: DC

## 2013-12-15 MED ORDER — FLUTICASONE-SALMETEROL 115-21 MCG/ACT IN AERO: 2.0000 | INHALATION_SPRAY | Freq: Two times a day (BID) | RESPIRATORY_TRACT | Status: DC

## 2013-12-15 NOTE — Progress Notes (Signed)
Jade [name] 1960-10-03 [id number]   History:    53 y.o.  for annual gyn exam who has had past history of vitamin D deficiency in the past several years ago she had a small left ovarian cyst which appear to be benign and she occasionally has some left lower quadrant discomfort. Patient has been concerned with her weight she has gained 4 passes last year she weighs 204 pounds.  Patient has a strong family history of cardiovascular disease where as her father had coronary bypass surgery and also had 2 aneurysms 1 coronary and 1 aortic. Patient is not on any hormone replacement last year she was referred to the cardiologist as a result of palpitations that she had been experiencing and had a normal 2-D echocardiogram as well as her Myoview stress test. The cardiologist could not reassure 100% that her discomfort palpitations was not related to CAD and to be 100% he recommended cardiac catheterization but she decided not to go through with it. Patient had not caffeine-containing products and has had no similar complaints.  The patient has not been on any hormone replacement therapy but has complained of vaginal dryness and irritation and dyspareunia. She has used Vaseline when necessary. Her [place] last year was elevated at 37.9. Review of her fasting lipid profile had demonstrated that her total cholesterol is elevated at 231 and her LDL is elevated at 155. Patient has not had a colonoscopy as of yet. She will get her flu vaccine in her job.    Past medical history,surgical history, family history and social history were all reviewed and documented in the EPIC chart.  Gynecologic History No LMP recorded. Patient has had a hysterectomy. Contraception: status post hysterectomy Last Pap: 2013. Results were: normal Last mammogram: Today. Results were: Results pending  Obstetric History OB History  Gravida Para Term Preterm AB SAB TAB Ectopic Multiple Living  1 1 1       1     # Outcome Date GA  Lbr Len/2nd Weight Sex Delivery Anes PTL Lv  1 TRM     F CS  N Y       ROS: A ROS was performed and pertinent positives and negatives are included in the history.  GENERAL: No fevers or chills. HEENT: No change in vision, no earache, sore throat or sinus congestion. NECK: No pain or stiffness. CARDIOVASCULAR: No chest pain or pressure. No palpitations. PULMONARY: No shortness of breath, cough or wheeze. GASTROINTESTINAL: No abdominal pain, nausea, vomiting or diarrhea, melena or bright red blood per rectum. GENITOURINARY: No urinary frequency, urgency, hesitancy or dysuria. MUSCULOSKELETAL: No joint or muscle pain, no back pain, no recent trauma. DERMATOLOGIC: No rash, no itching, no lesions. ENDOCRINE: No polyuria, polydipsia, no heat or cold intolerance. No recent change in weight. HEMATOLOGICAL: No anemia or easy bruising or bleeding. NEUROLOGIC: No headache, seizures, numbness, tingling or weakness. PSYCHIATRIC: No depression, no loss of interest in normal activity or change in sleep pattern.     Exam: chaperone present  BP 110/70  Ht 5' 6.75" (1.695 m)  Wt 204 lb (92.534 kg)  BMI 32.21 kg/m2  Body mass index is 32.21 kg/(m^2).  General appearance : Well developed well nourished female. No acute distress HEENT: Neck supple, trachea midline, no carotid bruits, no thyroidmegaly Lungs: Clear to auscultation, no rhonchi or wheezes, or rib retractions  Heart: Regular rate and rhythm, no murmurs or gallops Breast:Examined in sitting and supine position were symmetrical in appearance, no palpable masses or tenderness,  no skin retraction, no nipple inversion, no nipple discharge, no skin discoloration, no axillary or supraclavicular lymphadenopathy Abdomen: no palpable masses or tenderness, no rebound or guarding Extremities: no edema or skin discoloration or tenderness  Pelvic:  Bartholin, Urethra, Skene Glands: Within normal limits             Vagina: No gross lesions or  discharge  Cervix: Absent  Uterus  Absent  Adnexa  Without masses or tenderness  Anus and perineum  normal   Rectovaginal  normal sphincter tone without palpated masses or tenderness             Hemoccult cards will be provided   Ultrasound today:  Absent uterus. Left ovary not identified. Left adnexa difficult to see due to excessive bowel. Vaginal cuff and right ovary appears to be normal. No free fluid in the cul-de-sac.   Assessment/Plan:  53 y.o. female for annual exam who has vaginal atrophy and dyspareunia will be started on estradiol 5.69% vaginal application twice a week. Risk and benefits and pros and cons were discussed. The following labs were ordered today: Vitamin D: CBC, fasting lipid profile, TSH, comprehensive metabolic panel, and urinalysis. I have given her the name gastroenterologist for her to set up appointment for screening colonoscopy. Her bone density study was normal in 2014.she will need one next year. She takes steroid inhaler when necessary for her asthma. She was reminded to submit to the office the fecal Hemoccult cards to the office.  Note: This dictation was prepared with  Dragon/digital dictation along withSmart phrase technology. Any transcriptional errors that result from this process are unintentional.   Terrance Mass MD, 9:38 AM 12/15/2013

## 2013-12-16 LAB — VITAMIN D 25 HYDROXY (VIT D DEFICIENCY, FRACTURES): Vit D, 25-Hydroxy: 35 ng/mL (ref 30–89)

## 2013-12-18 ENCOUNTER — Telehealth: Payer: Self-pay | Admitting: *Deleted

## 2013-12-18 NOTE — Telephone Encounter (Signed)
Pt said you gave her sheet on medication contrave for weight loss on OV 12/15/13? Pt said based on labs result you were going to send Rx.

## 2013-12-19 NOTE — Telephone Encounter (Signed)
Please inform patient that if she read the information that I provided her on Contave to help her lose weight that we can call her in the prescription. Please advise her that I would need to see her once a month for blood pressure reading, pulmonary and cardiac auscultation and weight measurements.  If  she has read all the potential side effects with the medication and would like to proceed please call and prescription as follows:  Contrave 8ER/90ER Sig: Start one tablet every morning for one week, then one tablet by mouth twice a day x1 week, then 2 tablets by mouth q. a.m. and 1 tablet by mouth every afternoon for one week; thereafter take 2 tablets in the morning and 2 tablets in the evening not to exceed 4 tablets a day. The patient should discontinue the medication if weight loss is less than 5% after 12 weeks on maximum dose.  Do  not  cut, crush, or chew.  Avoid taking medication with high fat meal.  Call in 120 tablets with 5 refills

## 2013-12-22 MED ORDER — NALTREXONE-BUPROPION HCL ER 8-90 MG PO TB12: ORAL_TABLET | ORAL | Status: DC

## 2013-12-22 NOTE — Telephone Encounter (Signed)
Pt informed with the below note, rx called in

## 2013-12-29 ENCOUNTER — Ambulatory Visit: Payer: Managed Care, Other (non HMO) | Admitting: Family Medicine

## 2014-01-26 ENCOUNTER — Ambulatory Visit: Payer: Managed Care, Other (non HMO) | Admitting: Family Medicine

## 2014-02-09 ENCOUNTER — Other Ambulatory Visit: Payer: Self-pay

## 2014-02-26 ENCOUNTER — Encounter: Payer: Self-pay | Admitting: Gynecology

## 2014-05-31 ENCOUNTER — Other Ambulatory Visit: Payer: Self-pay | Admitting: Gynecology

## 2014-11-22 ENCOUNTER — Ambulatory Visit: Payer: Managed Care, Other (non HMO) | Admitting: Family Medicine

## 2015-04-28 HISTORY — PX: COLONOSCOPY W/ POLYPECTOMY: SHX1380

## 2015-07-10 ENCOUNTER — Other Ambulatory Visit: Payer: Self-pay

## 2015-07-10 ENCOUNTER — Other Ambulatory Visit: Payer: Self-pay | Admitting: *Deleted

## 2015-07-10 DIAGNOSIS — Z1231 Encounter for screening mammogram for malignant neoplasm of breast: Secondary | ICD-10-CM

## 2015-07-10 MED ORDER — VALACYCLOVIR HCL 500 MG PO TABS: 500.0000 mg | ORAL_TABLET | Freq: Every day | ORAL | Status: DC

## 2015-07-10 NOTE — Telephone Encounter (Signed)
Pt called requesting refill on Valtrex 500 mg, overdue for annual, transferred to front desk to schedule. Rx sent for valtrex

## 2015-09-16 ENCOUNTER — Encounter: Payer: Self-pay | Admitting: Internal Medicine

## 2015-09-20 ENCOUNTER — Encounter: Payer: Managed Care, Other (non HMO) | Admitting: Gynecology

## 2015-09-27 ENCOUNTER — Ambulatory Visit: Payer: Managed Care, Other (non HMO)

## 2015-09-27 ENCOUNTER — Encounter: Payer: Managed Care, Other (non HMO) | Admitting: Gynecology

## 2015-10-08 ENCOUNTER — Ambulatory Visit (AMBULATORY_SURGERY_CENTER): Payer: Self-pay

## 2015-10-08 VITALS — Ht 66.5 in | Wt 206.2 lb

## 2015-10-08 DIAGNOSIS — Z1211 Encounter for screening for malignant neoplasm of colon: Secondary | ICD-10-CM

## 2015-10-08 MED ORDER — NA SULFATE-K SULFATE-MG SULF 17.5-3.13-1.6 GM/177ML PO SOLN: ORAL | Status: DC

## 2015-10-08 NOTE — Progress Notes (Signed)
Per pt, no allergies to soy or egg products.Pt not taking any weight loss meds or using  O2 at home. 

## 2015-10-09 ENCOUNTER — Encounter: Payer: Self-pay | Admitting: Internal Medicine

## 2015-10-18 ENCOUNTER — Encounter: Payer: Self-pay | Admitting: Internal Medicine

## 2015-10-18 ENCOUNTER — Ambulatory Visit: Payer: Managed Care, Other (non HMO)

## 2015-10-18 ENCOUNTER — Ambulatory Visit (AMBULATORY_SURGERY_CENTER): Payer: BLUE CROSS/BLUE SHIELD | Admitting: Internal Medicine

## 2015-10-18 ENCOUNTER — Encounter: Payer: Managed Care, Other (non HMO) | Admitting: Gynecology

## 2015-10-18 VITALS — BP 111/64 | HR 70 | Temp 97.7°F | Resp 11 | Ht 66.5 in | Wt 206.0 lb

## 2015-10-18 DIAGNOSIS — D122 Benign neoplasm of ascending colon: Secondary | ICD-10-CM

## 2015-10-18 DIAGNOSIS — Z1211 Encounter for screening for malignant neoplasm of colon: Secondary | ICD-10-CM | POA: Diagnosis not present

## 2015-10-18 MED ORDER — SODIUM CHLORIDE 0.9 % IV SOLN: 500.0000 mL | INTRAVENOUS | Status: DC

## 2015-10-18 NOTE — Patient Instructions (Signed)

## 2015-10-18 NOTE — Progress Notes (Signed)
To recovery, report to Scott, RN, VSS 

## 2015-10-18 NOTE — Progress Notes (Signed)
Called to room to assist during endoscopic procedure.  Patient ID and intended procedure confirmed with present staff. Received instructions for my participation in the procedure from the performing physician.  

## 2015-10-18 NOTE — Op Note (Signed)
[facility] Patient Name: Jade [name] Procedure Date: 10/18/2015 1:30 PM MRN: [id number] Endoscopist: Docia Chuck. Henrene Pastor , MD Age: 55 Referring MD:  Date of Birth: 05-25-1960 Gender: Female Account #: [id number] Procedure:                Colonoscopy Indications:              Screening for colorectal malignant neoplasm Medicines:                Monitored Anesthesia Care Procedure:                Pre-Anesthesia Assessment:                           - Prior to the procedure, a History and Physical                            was performed, and patient medications and                            allergies were reviewed. The patient's tolerance of                            previous anesthesia was also reviewed. The risks                            and benefits of the procedure and the sedation                            options and risks were discussed with the patient.                            All questions were answered, and informed consent                            was obtained. Prior Anticoagulants: The patient has                            taken no previous anticoagulant or antiplatelet                            agents. ASA Grade Assessment: II - A patient with                            mild systemic disease. After reviewing the risks                            and benefits, the patient was deemed in                            satisfactory condition to undergo the procedure.                           After obtaining informed consent, the colonoscope  was passed under direct vision. Throughout the                            procedure, the patient's blood pressure, pulse, and                            oxygen saturations were monitored continuously. The                            Model CF-HQ190L [id number]) scope was introduced                            through the anus and advanced to the the cecum,                            identified by  appendiceal orifice and ileocecal                            valve. The ileocecal valve, appendiceal orifice,                            and rectum were photographed. The quality of the                            bowel preparation was excellent. The colonoscopy                            was performed without difficulty. The patient                            tolerated the procedure well. The bowel preparation                            used was SUPREP. Scope In: 1:44:46 PM Scope Out: 1:56:49 PM Scope Withdrawal Time: 0 hours 9 minutes 23 seconds  Total Procedure Duration: 0 hours 12 minutes 3 seconds  Findings:                 A 2 mm polyp was found in the ascending colon. The                            polyp was removed with a cold snare. Resection and                            retrieval were complete.                           A single medium-mouthed diverticulum was found in                            the ascending colon.                           The exam was otherwise without abnormality on  direct and retroflexion views. Complications:            No immediate complications. Estimated blood loss:                            None. Estimated Blood Loss:     Estimated blood loss: none. Impression:               - One 2 mm polyp in the ascending colon, removed                            with a cold snare. Resected and retrieved.                           - Diverticulosis in the ascending colon.                           - The examination was otherwise normal on direct                            and retroflexion views. Recommendation:           - Repeat colonoscopy in 5-10 years for surveillance.                           - Patient has a contact number available for                            emergencies. The signs and symptoms of potential                            delayed complications were discussed with the                            patient. Return to  normal activities tomorrow.                            Written discharge instructions were provided to the                            patient.                           - Resume previous diet.                           - Continue present medications.                           - Await pathology results. Docia Chuck. Henrene Pastor, MD 10/18/2015 2:00:47 PM This report has been signed electronically. CC Letter to:             Uvaldo Rising, MD

## 2015-10-21 ENCOUNTER — Telehealth: Payer: Self-pay

## 2015-10-21 NOTE — Telephone Encounter (Signed)
  Follow up Call-  Call back number 10/18/2015  Post procedure Call Back phone  # [phone]  Permission to leave phone message Yes     Patient was called for follow up after her procedure on 10/18/2015. No answer at the number given for follow up phone call. A message was left on the answering machine.

## 2015-10-24 ENCOUNTER — Encounter: Payer: Self-pay | Admitting: Internal Medicine

## 2015-10-25 ENCOUNTER — Encounter: Payer: Managed Care, Other (non HMO) | Admitting: Gynecology

## 2015-11-08 ENCOUNTER — Encounter: Payer: Managed Care, Other (non HMO) | Admitting: Gynecology

## 2015-11-08 ENCOUNTER — Ambulatory Visit: Payer: Managed Care, Other (non HMO)

## 2015-11-11 ENCOUNTER — Telehealth: Payer: Self-pay | Admitting: *Deleted

## 2015-11-11 MED ORDER — VALACYCLOVIR HCL 500 MG PO TABS: 500.0000 mg | ORAL_TABLET | Freq: Every day | ORAL | Status: DC

## 2015-11-11 NOTE — Telephone Encounter (Signed)
Pt has annual scheduled on 01/03/16 , needs refill on Valtrex, Rx sent

## 2015-12-03 ENCOUNTER — Ambulatory Visit: Payer: BLUE CROSS/BLUE SHIELD

## 2015-12-23 ENCOUNTER — Ambulatory Visit
Admission: RE | Admit: 2015-12-23 | Discharge: 2015-12-23 | Disposition: A | Discharge status: Home / Self Care | Payer: BLUE CROSS/BLUE SHIELD | Source: Ambulatory Visit

## 2015-12-23 DIAGNOSIS — Z1231 Encounter for screening mammogram for malignant neoplasm of breast: Secondary | ICD-10-CM | POA: Diagnosis not present

## 2015-12-27 DIAGNOSIS — J039 Acute tonsillitis, unspecified: Secondary | ICD-10-CM | POA: Diagnosis not present

## 2016-01-03 ENCOUNTER — Encounter: Payer: BLUE CROSS/BLUE SHIELD | Admitting: Gynecology

## 2016-01-24 ENCOUNTER — Ambulatory Visit (INDEPENDENT_AMBULATORY_CARE_PROVIDER_SITE_OTHER): Payer: BLUE CROSS/BLUE SHIELD | Admitting: Gynecology

## 2016-01-24 ENCOUNTER — Encounter: Payer: Self-pay | Admitting: Gynecology

## 2016-01-24 VITALS — BP 110/80 | Ht 66.0 in | Wt 203.0 lb

## 2016-01-24 DIAGNOSIS — Z79890 Hormone replacement therapy (postmenopausal): Secondary | ICD-10-CM

## 2016-01-24 DIAGNOSIS — N952 Postmenopausal atrophic vaginitis: Secondary | ICD-10-CM | POA: Diagnosis not present

## 2016-01-24 DIAGNOSIS — N941 Unspecified dyspareunia: Secondary | ICD-10-CM

## 2016-01-24 DIAGNOSIS — Z8639 Personal history of other endocrine, nutritional and metabolic disease: Secondary | ICD-10-CM

## 2016-01-24 DIAGNOSIS — Z01419 Encounter for gynecological examination (general) (routine) without abnormal findings: Secondary | ICD-10-CM | POA: Diagnosis not present

## 2016-01-24 LAB — CBC WITH DIFFERENTIAL/PLATELET
Basophils Absolute: 75 cells/uL (ref 0–200)
Basophils Relative: 1 %
Eosinophils Absolute: 150 cells/uL (ref 15–500)
Eosinophils Relative: 2 %
HCT: 42.3 % (ref 35.0–45.0)
Hemoglobin: 14.2 g/dL (ref 11.7–15.5)
Lymphocytes Relative: 35 %
Lymphs Abs: 2625 cells/uL (ref 850–3900)
MCH: 29.2 pg (ref 27.0–33.0)
MCHC: 33.6 g/dL (ref 32.0–36.0)
MCV: 86.9 fL (ref 80.0–100.0)
MPV: 11.7 fL (ref 7.5–12.5)
Monocytes Absolute: 525 cells/uL (ref 200–950)
Monocytes Relative: 7 %
Neutro Abs: 4125 cells/uL (ref 1500–7800)
Neutrophils Relative %: 55 %
Platelets: 257 10*3/uL (ref 140–400)
RBC: 4.87 MIL/uL (ref 3.80–5.10)
RDW: 13.3 % (ref 11.0–15.0)
WBC: 7.5 10*3/uL (ref 3.8–10.8)

## 2016-01-24 LAB — COMPREHENSIVE METABOLIC PANEL
ALT: 14 U/L (ref 6–29)
AST: 20 U/L (ref 10–35)
Albumin: 4.5 g/dL (ref 3.6–5.1)
Alkaline Phosphatase: 55 U/L (ref 33–130)
BUN: 11 mg/dL (ref 7–25)
CO2: 28 mmol/L (ref 20–31)
Calcium: 9.5 mg/dL (ref 8.6–10.4)
Chloride: 103 mmol/L (ref 98–110)
Creat: 0.62 mg/dL (ref 0.50–1.05)
Glucose, Bld: 90 mg/dL (ref 65–99)
Potassium: 4.4 mmol/L (ref 3.5–5.3)
Sodium: 140 mmol/L (ref 135–146)
Total Bilirubin: 0.7 mg/dL (ref 0.2–1.2)
Total Protein: 7.5 g/dL (ref 6.1–8.1)

## 2016-01-24 LAB — LIPID PANEL
Cholesterol: 205 mg/dL — ABNORMAL HIGH (ref 125–200)
HDL: 66 mg/dL (ref >=46)
LDL Cholesterol: 120 mg/dL (ref <130)
Total CHOL/HDL Ratio: 3.1 Ratio (ref <=5.0)
Triglycerides: 95 mg/dL (ref <150)
VLDL: 19 mg/dL (ref <30)

## 2016-01-24 LAB — TSH: TSH: 1.04 mIU/L

## 2016-01-24 MED ORDER — ESTRADIOL 10 MCG VA TABS: 1.0000 | ORAL_TABLET | VAGINAL | 11 refills | Status: DC

## 2016-01-24 MED ORDER — VALACYCLOVIR HCL 500 MG PO TABS: 500.0000 mg | ORAL_TABLET | Freq: Every day | ORAL | 11 refills | Status: DC

## 2016-01-24 NOTE — Progress Notes (Signed)
Jade [name] 1960/11/01 [id number]   History:    55 y.o.  for annual gyn exam with a complaint of vaginal dryness and dyspareunia. Patient currently on no hormone replacement therapy. Patient with no vasomotor symptoms. Patient with past history vitamin D deficiency.Patient has a strong family history of cardiovascular disease where as her father had coronary bypass surgery and also had 2 aneurysms 1 coronary and 1 aortic. Patient is not on any hormone replacement last year she was referred to the cardiologist as a result of palpitations that she had been experiencing and had a normal 2-D echocardiogram as well as her Myoview stress test. The cardiologist could not reassure 100% that her discomfort palpitations was not related to CAD and to be 100% he recommended cardiac catheterization but she decided not to go through with it. Patient had not caffeine-containing products and has had no similar complaints. Patient had a colonoscopy this year and benign colon polyps were removed she is on a 5 year recall. 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 Her bone density study was normal in 2014. Patient prior to her hysterectomy had no history of abnormal Pap smears. Patient's last office visit was in 2015.  Past medical history,surgical history, family history and social history were all reviewed and documented in the EPIC chart.  Gynecologic History No LMP recorded. Patient has had a hysterectomy. Contraception: status post hysterectomy Last Pap: 2013. Results were: normal Last mammogram: 2017. Results were: normal  Obstetric History OB History  Gravida Para Term Preterm AB Living  1 1 1     1   SAB TAB Ectopic Multiple Live Births          1    # Outcome Date GA Lbr Len/2nd Weight Sex Delivery Anes PTL Lv  1 Term     F CS-Unspec  N LIV       ROS: A ROS was performed and pertinent positives and negatives are included in the history.  GENERAL: No fevers or chills. HEENT: No change in vision, no earache, sore throat or sinus congestion. NECK: No pain or stiffness. CARDIOVASCULAR: No chest pain or pressure. No palpitations. PULMONARY: No shortness of breath, cough or wheeze. GASTROINTESTINAL: No abdominal pain, nausea, vomiting or diarrhea, melena or bright red blood per rectum. GENITOURINARY: No urinary frequency, urgency, hesitancy or dysuria. MUSCULOSKELETAL: No joint or muscle pain, no back pain, no recent trauma. DERMATOLOGIC: No rash, no itching, no lesions. ENDOCRINE: No polyuria, polydipsia, no heat or cold intolerance. No recent change in weight. HEMATOLOGICAL: No anemia or easy bruising or bleeding. NEUROLOGIC: No headache, seizures, numbness, tingling or weakness. PSYCHIATRIC: No depression, no loss of interest in normal activity or change in sleep pattern.     Exam: chaperone present  BP 110/80   Ht 5\' 6"  (1.676 m)   Wt 203 lb (92.1 kg)   BMI 32.77 kg/m   Body mass index is 32.77 kg/m.  General appearance : Well developed well nourished female. No acute distress HEENT: Eyes: no retinal hemorrhage or exudates,  Neck supple, trachea midline, no carotid bruits, no thyroidmegaly Lungs: Clear to auscultation, no rhonchi or wheezes, or rib retractions  Heart: Regular rate and rhythm, no murmurs or gallops Breast:Examined in sitting and supine position were symmetrical in appearance, no palpable masses or tenderness,  no skin retraction, no nipple inversion, no nipple discharge, no skin discoloration, no axillary or supraclavicular lymphadenopathy Abdomen: no palpable masses or tenderness, no  rebound or guarding Extremities: no edema or skin discoloration or tenderness  Pelvic:  Bartholin, Urethra, Skene Glands: Within normal limits             Vagina: No gross lesions or discharge, atrophic changes  Cervix: Absent  Uterus  absent  Adnexa  Without masses or tenderness  Anus and perineum  normal   Rectovaginal   normal sphincter tone without palpated masses or tenderness             Hemoccult colonoscopy this year benign polyps removed     Assessment/Plan:  55 y.o. female for annual exam with vaginal atrophy contributing to her dyspareunia. We discussed the Vagifem intravaginal estrogen to apply 10 g tablet twice a week. Risk benefits pros and cons were discussed as well as literature information was provided. Pap smear not indicated. The following blood work was ordered today: Vitamin D because of her history vitamin D deficiency in the past, fasting lipid profile, comprehensive metabolic panel, TSH, CBC, and urinalysis. Patient also will be schedule her bone density study here in the office.   Terrance Mass MD, 11:17 AM 01/24/2016

## 2016-01-24 NOTE — Patient Instructions (Signed)
Influenza Virus Vaccine (Flucelvax) What is this medicine? INFLUENZA VIRUS VACCINE (in floo EN zuh VAHY ruhs vak SEEN) helps to reduce the risk of getting influenza also known as the flu. The vaccine only helps protect you against some strains of the flu. This medicine may be used for other purposes; ask your health care provider or pharmacist if you have questions. What should I tell my health care provider before I take this medicine? They need to know if you have any of these conditions: -bleeding disorder like hemophilia -fever or infection -Guillain-Barre syndrome or other neurological problems -immune system problems -infection with the human immunodeficiency virus (HIV) or AIDS -low blood platelet counts -multiple sclerosis -an unusual or allergic reaction to influenza virus vaccine, other medicines, foods, dyes or preservatives -pregnant or trying to get pregnant -breast-feeding How should I use this medicine? This vaccine is for injection into a muscle. It is given by a health care professional. A copy of Vaccine Information Statements will be given before each vaccination. Read this sheet carefully each time. The sheet may change frequently. Talk to your pediatrician regarding the use of this medicine in children. Special care may be needed. Overdosage: If you think you've taken too much of this medicine contact a poison control center or emergency room at once. Overdosage: If you think you have taken too much of this medicine contact a poison control center or emergency room at once. NOTE: This medicine is only for you. Do not share this medicine with others. What if I miss a dose? This does not apply. What may interact with this medicine? -chemotherapy or radiation therapy -medicines that lower your immune system like etanercept, anakinra, infliximab, and adalimumab -medicines that treat or prevent blood clots like warfarin -phenytoin -steroid medicines like prednisone or  cortisone -theophylline -vaccines This list may not describe all possible interactions. Give your health care provider a list of all the medicines, herbs, non-prescription drugs, or dietary supplements you use. Also tell them if you smoke, drink alcohol, or use illegal drugs. Some items may interact with your medicine. What should I watch for while using this medicine? Report any side effects that do not go away within 3 days to your doctor or health care professional. Call your health care provider if any unusual symptoms occur within 6 weeks of receiving this vaccine. You may still catch the flu, but the illness is not usually as bad. You cannot get the flu from the vaccine. The vaccine will not protect against colds or other illnesses that may cause fever. The vaccine is needed every year. What side effects may I notice from receiving this medicine? Side effects that you should report to your doctor or health care professional as soon as possible: -allergic reactions like skin rash, itching or hives, swelling of the face, lips, or tongue Side effects that usually do not require medical attention (Report these to your doctor or health care professional if they continue or are bothersome.): -fever -headache -muscle aches and pains -pain, tenderness, redness, or swelling at the injection site -tiredness This list may not describe all possible side effects. Call your doctor for medical advice about side effects. You may report side effects to FDA at 1-800-FDA-1088. Where should I keep my medicine? The vaccine will be given by a health care professional in a clinic, pharmacy, doctor's office, or other health care setting. You will not be given vaccine doses to store at home. NOTE: This sheet is a summary. It may not cover   all possible information. If you have questions about this medicine, talk to your doctor, pharmacist, or health care provider.    2016, Elsevier/Gold Standard. (2011-03-25  14:06:47) Bone Densitometry Bone densitometry is an imaging test that uses a special X-ray to measure the amount of calcium and other minerals in your bones (bone density). This test is also known as a bone mineral density test or dual-energy X-ray absorptiometry (DXA). The test can measure bone density at your hip and your spine. It is similar to having a regular X-ray. You may have this test to:  Diagnose a condition that causes weak or thin bones (osteoporosis).  Predict your risk of a broken bone (fracture).  Determine how well osteoporosis treatment is working. LET [facility] CARE PROVIDER KNOW ABOUT:  Any allergies you have.  All medicines you are taking, including vitamins, herbs, eye drops, creams, and over-the-counter medicines.  Previous problems you or members of your family have had with the use of anesthetics.  Any blood disorders you have.  Previous surgeries you have had.  Medical conditions you have.  Possibility of pregnancy.  Any other medical test you had within the previous 14 days that used contrast material. RISKS AND COMPLICATIONS Generally, this is a safe procedure. However, problems can occur and may include the following:  This test exposes you to a very small amount of radiation.  The risks of radiation exposure may be greater to unborn children. BEFORE THE PROCEDURE  Do not take any calcium supplements for 24 hours before having the test. You can otherwise eat and drink what you usually do.  Take off all metal jewelry, eyeglasses, dental appliances, and any other metal objects. PROCEDURE  You may lie on an exam table. There will be an X-ray generator below you and an imaging device above you.  Other devices, such as boxes or braces, may be used to position your body properly for the scan.  You will need to lie still while the machine slowly scans your body.  The images will show up on a computer monitor. AFTER THE PROCEDURE You may need  more testing at a later time.   This information is not intended to replace advice given to you by your health care provider. Make sure you discuss any questions you have with your health care provider.   Document Released: 05/05/2004 Document Revised: 05/04/2014 Document Reviewed: 09/21/2013 Elsevier Interactive Patient Education [employer].

## 2016-01-25 LAB — URINALYSIS W MICROSCOPIC + REFLEX CULTURE
Bacteria, UA: NONE SEEN [HPF]
Bilirubin Urine: NEGATIVE
Casts: NONE SEEN [LPF]
Crystals: NONE SEEN [HPF]
Glucose, UA: NEGATIVE
Hgb urine dipstick: NEGATIVE
Leukocytes, UA: NEGATIVE
Nitrite: NEGATIVE
Protein, ur: NEGATIVE
Specific Gravity, Urine: 1.027 (ref 1.001–1.035)
WBC, UA: NONE SEEN WBC/HPF (ref <=5)
Yeast: NONE SEEN [HPF]
pH: 5.5 (ref 5.0–8.0)

## 2016-01-25 LAB — VITAMIN D 25 HYDROXY (VIT D DEFICIENCY, FRACTURES): Vit D, 25-Hydroxy: 19 ng/mL — ABNORMAL LOW (ref 30–100)

## 2016-01-26 LAB — URINE CULTURE: Organism ID, Bacteria: NO GROWTH

## 2016-02-20 ENCOUNTER — Other Ambulatory Visit: Payer: Self-pay | Admitting: Gynecology

## 2016-02-20 DIAGNOSIS — E559 Vitamin D deficiency, unspecified: Secondary | ICD-10-CM

## 2016-02-20 MED ORDER — VITAMIN D (ERGOCALCIFEROL) 1.25 MG (50000 UNIT) PO CAPS: 50000.0000 [IU] | ORAL_CAPSULE | ORAL | 0 refills | Status: DC

## 2016-03-06 DIAGNOSIS — H524 Presbyopia: Secondary | ICD-10-CM | POA: Diagnosis not present

## 2016-03-06 DIAGNOSIS — J3501 Chronic tonsillitis: Secondary | ICD-10-CM | POA: Diagnosis not present

## 2016-03-06 DIAGNOSIS — J310 Chronic rhinitis: Secondary | ICD-10-CM | POA: Diagnosis not present

## 2016-03-06 DIAGNOSIS — H6123 Impacted cerumen, bilateral: Secondary | ICD-10-CM | POA: Diagnosis not present

## 2016-03-09 DIAGNOSIS — J209 Acute bronchitis, unspecified: Secondary | ICD-10-CM | POA: Diagnosis not present

## 2016-06-01 ENCOUNTER — Other Ambulatory Visit: Payer: Self-pay | Admitting: Gynecology

## 2016-08-13 DIAGNOSIS — R000 Tachycardia, unspecified: Secondary | ICD-10-CM | POA: Diagnosis not present

## 2016-08-13 DIAGNOSIS — Z881 Allergy status to other antibiotic agents status: Secondary | ICD-10-CM | POA: Diagnosis not present

## 2016-08-13 DIAGNOSIS — J45901 Unspecified asthma with (acute) exacerbation: Secondary | ICD-10-CM | POA: Diagnosis not present

## 2016-08-13 DIAGNOSIS — R509 Fever, unspecified: Secondary | ICD-10-CM | POA: Diagnosis not present

## 2016-08-13 DIAGNOSIS — Z79899 Other long term (current) drug therapy: Secondary | ICD-10-CM | POA: Diagnosis not present

## 2016-08-13 DIAGNOSIS — B009 Herpesviral infection, unspecified: Secondary | ICD-10-CM | POA: Diagnosis not present

## 2016-08-13 DIAGNOSIS — Z882 Allergy status to sulfonamides status: Secondary | ICD-10-CM | POA: Diagnosis not present

## 2016-08-13 DIAGNOSIS — R0600 Dyspnea, unspecified: Secondary | ICD-10-CM | POA: Diagnosis not present

## 2016-08-19 DIAGNOSIS — R0602 Shortness of breath: Secondary | ICD-10-CM | POA: Diagnosis not present

## 2016-08-19 DIAGNOSIS — Z883 Allergy status to other anti-infective agents status: Secondary | ICD-10-CM | POA: Diagnosis not present

## 2016-08-19 DIAGNOSIS — R079 Chest pain, unspecified: Secondary | ICD-10-CM | POA: Diagnosis not present

## 2016-08-19 DIAGNOSIS — J45901 Unspecified asthma with (acute) exacerbation: Secondary | ICD-10-CM | POA: Diagnosis not present

## 2016-08-19 DIAGNOSIS — R918 Other nonspecific abnormal finding of lung field: Secondary | ICD-10-CM | POA: Diagnosis not present

## 2016-08-19 DIAGNOSIS — Z79899 Other long term (current) drug therapy: Secondary | ICD-10-CM | POA: Diagnosis not present

## 2016-08-19 DIAGNOSIS — Z882 Allergy status to sulfonamides status: Secondary | ICD-10-CM | POA: Diagnosis not present

## 2016-08-19 DIAGNOSIS — J45909 Unspecified asthma, uncomplicated: Secondary | ICD-10-CM | POA: Diagnosis not present

## 2016-09-03 DIAGNOSIS — J209 Acute bronchitis, unspecified: Secondary | ICD-10-CM | POA: Diagnosis not present

## 2016-09-03 DIAGNOSIS — J309 Allergic rhinitis, unspecified: Secondary | ICD-10-CM | POA: Diagnosis not present

## 2016-09-03 DIAGNOSIS — R05 Cough: Secondary | ICD-10-CM | POA: Diagnosis not present

## 2016-09-03 DIAGNOSIS — R0602 Shortness of breath: Secondary | ICD-10-CM | POA: Diagnosis not present

## 2016-09-09 ENCOUNTER — Encounter: Payer: Self-pay | Admitting: Gynecology

## 2016-10-06 DIAGNOSIS — N390 Urinary tract infection, site not specified: Secondary | ICD-10-CM | POA: Diagnosis not present

## 2016-12-11 ENCOUNTER — Other Ambulatory Visit: Payer: Self-pay | Admitting: Gynecology

## 2016-12-11 DIAGNOSIS — Z1231 Encounter for screening mammogram for malignant neoplasm of breast: Secondary | ICD-10-CM

## 2017-01-07 DIAGNOSIS — Z23 Encounter for immunization: Secondary | ICD-10-CM | POA: Diagnosis not present

## 2017-01-29 ENCOUNTER — Ambulatory Visit: Payer: BLUE CROSS/BLUE SHIELD

## 2017-01-29 ENCOUNTER — Encounter: Payer: BLUE CROSS/BLUE SHIELD | Admitting: Gynecology

## 2017-02-01 ENCOUNTER — Other Ambulatory Visit: Payer: Self-pay

## 2017-02-03 ENCOUNTER — Other Ambulatory Visit: Payer: Self-pay | Admitting: *Deleted

## 2017-02-03 MED ORDER — VALACYCLOVIR HCL 500 MG PO TABS: 500.0000 mg | ORAL_TABLET | Freq: Every day | ORAL | 0 refills | Status: DC

## 2017-03-02 DIAGNOSIS — J309 Allergic rhinitis, unspecified: Secondary | ICD-10-CM | POA: Diagnosis not present

## 2017-03-02 DIAGNOSIS — R0602 Shortness of breath: Secondary | ICD-10-CM | POA: Diagnosis not present

## 2017-03-02 DIAGNOSIS — R05 Cough: Secondary | ICD-10-CM | POA: Diagnosis not present

## 2017-03-02 DIAGNOSIS — J209 Acute bronchitis, unspecified: Secondary | ICD-10-CM | POA: Diagnosis not present

## 2017-03-04 DIAGNOSIS — D225 Melanocytic nevi of trunk: Secondary | ICD-10-CM | POA: Diagnosis not present

## 2017-03-05 DIAGNOSIS — J45909 Unspecified asthma, uncomplicated: Secondary | ICD-10-CM | POA: Diagnosis not present

## 2017-03-08 ENCOUNTER — Other Ambulatory Visit: Payer: Self-pay | Admitting: Family Medicine

## 2017-03-08 DIAGNOSIS — Z1231 Encounter for screening mammogram for malignant neoplasm of breast: Secondary | ICD-10-CM

## 2017-03-12 ENCOUNTER — Other Ambulatory Visit: Payer: Self-pay | Admitting: Women's Health

## 2017-03-12 DIAGNOSIS — H524 Presbyopia: Secondary | ICD-10-CM | POA: Diagnosis not present

## 2017-04-02 ENCOUNTER — Ambulatory Visit: Payer: BLUE CROSS/BLUE SHIELD

## 2017-04-09 ENCOUNTER — Ambulatory Visit: Payer: BLUE CROSS/BLUE SHIELD | Admitting: Family Medicine

## 2017-04-09 ENCOUNTER — Ambulatory Visit: Payer: BLUE CROSS/BLUE SHIELD

## 2017-04-30 ENCOUNTER — Ambulatory Visit: Payer: BLUE CROSS/BLUE SHIELD | Admitting: Family Medicine

## 2017-05-14 ENCOUNTER — Encounter: Payer: Self-pay | Admitting: Family Medicine

## 2017-05-14 ENCOUNTER — Ambulatory Visit
Admission: RE | Admit: 2017-05-14 | Discharge: 2017-05-14 | Disposition: A | Discharge status: Home / Self Care | Payer: BLUE CROSS/BLUE SHIELD | Source: Ambulatory Visit | Attending: Family Medicine | Admitting: Family Medicine

## 2017-05-14 ENCOUNTER — Ambulatory Visit: Payer: BLUE CROSS/BLUE SHIELD | Admitting: Family Medicine

## 2017-05-14 VITALS — BP 108/72 | HR 100 | Temp 98.3°F | Resp 20 | Ht 67.0 in | Wt 210.8 lb

## 2017-05-14 DIAGNOSIS — Z0000 Encounter for general adult medical examination without abnormal findings: Secondary | ICD-10-CM | POA: Diagnosis not present

## 2017-05-14 DIAGNOSIS — Z131 Encounter for screening for diabetes mellitus: Secondary | ICD-10-CM

## 2017-05-14 DIAGNOSIS — R000 Tachycardia, unspecified: Secondary | ICD-10-CM

## 2017-05-14 DIAGNOSIS — E669 Obesity, unspecified: Secondary | ICD-10-CM

## 2017-05-14 DIAGNOSIS — J45909 Unspecified asthma, uncomplicated: Secondary | ICD-10-CM | POA: Diagnosis not present

## 2017-05-14 DIAGNOSIS — Z79899 Other long term (current) drug therapy: Secondary | ICD-10-CM

## 2017-05-14 DIAGNOSIS — Z7689 Persons encountering health services in other specified circumstances: Secondary | ICD-10-CM

## 2017-05-14 DIAGNOSIS — Z1231 Encounter for screening mammogram for malignant neoplasm of breast: Secondary | ICD-10-CM

## 2017-05-14 DIAGNOSIS — Z130 Encounter for screening for diseases of the blood and blood-forming organs and certain disorders involving the immune mechanism: Secondary | ICD-10-CM

## 2017-05-14 DIAGNOSIS — E559 Vitamin D deficiency, unspecified: Secondary | ICD-10-CM | POA: Diagnosis not present

## 2017-05-14 DIAGNOSIS — B009 Herpesviral infection, unspecified: Secondary | ICD-10-CM

## 2017-05-14 LAB — CBC WITH DIFFERENTIAL/PLATELET
Basophils Absolute: 0.1 10*3/uL (ref 0.0–0.1)
Basophils Relative: 1.3 % (ref 0.0–3.0)
Eosinophils Absolute: 0.2 10*3/uL (ref 0.0–0.7)
Eosinophils Relative: 2.7 % (ref 0.0–5.0)
HCT: 39.9 % (ref 36.0–46.0)
Hemoglobin: 13.4 g/dL (ref 12.0–15.0)
Lymphocytes Relative: 42 % (ref 12.0–46.0)
Lymphs Abs: 2.5 10*3/uL (ref 0.7–4.0)
MCHC: 33.5 g/dL (ref 30.0–36.0)
MCV: 88.2 fl (ref 78.0–100.0)
Monocytes Absolute: 0.4 10*3/uL (ref 0.1–1.0)
Monocytes Relative: 6.9 % (ref 3.0–12.0)
Neutro Abs: 2.8 10*3/uL (ref 1.4–7.7)
Neutrophils Relative %: 47.1 % (ref 43.0–77.0)
Platelets: 212 10*3/uL (ref 150.0–400.0)
RBC: 4.53 Mil/uL (ref 3.87–5.11)
RDW: 13.3 % (ref 11.5–15.5)
WBC: 6 10*3/uL (ref 4.0–10.5)

## 2017-05-14 LAB — LIPID PANEL
Cholesterol: 222 mg/dL — ABNORMAL HIGH (ref 0–200)
HDL: 56.8 mg/dL (ref >39.00)
LDL Cholesterol: 140 mg/dL — ABNORMAL HIGH (ref 0–99)
NonHDL: 164.89
Total CHOL/HDL Ratio: 4
Triglycerides: 122 mg/dL (ref 0.0–149.0)
VLDL: 24.4 mg/dL (ref 0.0–40.0)

## 2017-05-14 LAB — COMPREHENSIVE METABOLIC PANEL
ALT: 11 U/L (ref 0–35)
AST: 16 U/L (ref 0–37)
Albumin: 4.2 g/dL (ref 3.5–5.2)
Alkaline Phosphatase: 56 U/L (ref 39–117)
BUN: 12 mg/dL (ref 6–23)
CO2: 29 mEq/L (ref 19–32)
Calcium: 9.1 mg/dL (ref 8.4–10.5)
Chloride: 105 mEq/L (ref 96–112)
Creatinine, Ser: 0.55 mg/dL (ref 0.40–1.20)
GFR: 122.2 mL/min (ref >60.00)
Glucose, Bld: 95 mg/dL (ref 70–99)
Potassium: 3.8 mEq/L (ref 3.5–5.1)
Sodium: 140 mEq/L (ref 135–145)
Total Bilirubin: 0.7 mg/dL (ref 0.2–1.2)
Total Protein: 6.5 g/dL (ref 6.0–8.3)

## 2017-05-14 LAB — VITAMIN D 25 HYDROXY (VIT D DEFICIENCY, FRACTURES): VITD: 14.9 ng/mL — ABNORMAL LOW (ref 30.00–100.00)

## 2017-05-14 LAB — HEMOGLOBIN A1C: Hgb A1c MFr Bld: 5.8 % (ref 4.6–6.5)

## 2017-05-14 LAB — TSH: TSH: 1.48 u[IU]/mL (ref 0.35–4.50)

## 2017-05-14 MED ORDER — VALACYCLOVIR HCL 500 MG PO TABS: 500.0000 mg | ORAL_TABLET | Freq: Every day | ORAL | 3 refills | Status: DC

## 2017-05-14 NOTE — Progress Notes (Signed)
Patient ID: Jade [name], female  DOB: [birth year], 57 y.o.   MRN: [id number] Patient Care Team    Relationship Specialty Notifications Start End  Ma Hillock, DO PCP - General Family Medicine  05/14/17   Irene Shipper, MD Consulting Physician Gastroenterology  05/14/17   Etta Quill, [place] Referring Physician Optometry  05/14/17   Valora Piccolo, MD Consulting Physician Allergy  05/14/17     Chief Complaint  Patient presents with  . Establish Care    Subjective:  Jade [name] is a 57 y.o.  female present for new patient establishment. All past medical history, surgical history, allergies, family history, immunizations, medications and social history were obtained and entered in the electronic medical record today. All recent labs, ED visits and hospitalizations within the last year were reviewed.  Health maintenance:  Colonoscopy: completed 2017, by Dr. Henrene Pastor, resutls colon polyps. follow up 5 year follow-up. Mammogram: completed: Today, birads 1.  Cervical cancer screening: History of hysterectomy not indicated Immunizations: tdap patient unsure of date, within last 10 years, Influenza UTD 01/25/2017 (encouraged yearly) Infectious disease screening: HIV completed many years ago. Patient declines further testing. DEXA: N/A PSA: No results found for: PSA Assistive device: None Oxygen use: None Patient has a Dental home. Hospitalizations/ED visits: Reviewed  Depression screen [facility] 2/9 05/14/2017  Decreased Interest 0  Down, Depressed, Hopeless 0  PHQ - 2 Score 0   GAD 7 : Generalized Anxiety Score 05/14/2017  Nervous, Anxious, on Edge 0  Control/stop worrying 0  Worry too much - different things 0  Trouble relaxing 0  Restless 0  Easily annoyed or irritable 0  Afraid - awful might happen 0  Total GAD 7 Score 0      Current Exercise Habits: The patient does not participate in regular exercise at present Exercise limited by: None identified Fall Risk   05/14/2017  Falls in the past year? No     Immunization History  Administered Date(s) Administered  . Influenza-Unspecified 01/25/2017    No exam data present  Past Medical History:  Diagnosis Date  . Anxiety   . Asthma    uses inhaler prn  . Chest pain   . Colon polyps   . Diarrhea    with constipation while taking diet pills (Alli)  . History of rectal bleeding     2 times in past  . HSV-2 (herpes simplex virus 2) infection   . Palpitations    in past/due to Advil pm  . Vitamin D deficiency    Allergies  Allergen Reactions  . Ciprocin-Fluocin-Procin [Fluocinolone Acetonide]     SOB  . Sulfa Antibiotics     hives   Past Surgical History:  Procedure Laterality Date  . ABDOMINAL HYSTERECTOMY  1998   fibroids. partial  . CESAREAN SECTION     1 time  . COLONOSCOPY W/ POLYPECTOMY  2017  . RESECTOSCOPIC MYOMECTOMY     Family History  Problem Relation Age of Onset  . Diabetes Father   . Hypertension Father   . Cancer Father        prostate  . Heart disease Father   . Atrial fibrillation Sister   . Atrial fibrillation Brother   . Thyroid disease Sister   . Diabetes Paternal Uncle   . Cancer Paternal Grandfather   . Colon cancer Neg Hx    Social History   Socioeconomic History  . Marital status: Married    Spouse name: Not on file  .  Number of children: 1  . Years of education: Not on file  . Highest education level: Not on file  Social Needs  . Financial resource strain: Not on file  . Food insecurity - worry: Not on file  . Food insecurity - inability: Not on file  . Transportation needs - medical: Not on file  . Transportation needs - non-medical: Not on file  Occupational History  . Occupation: Copywriter, advertising  Tobacco Use  . Smoking status: Never Smoker  . Smokeless tobacco: Never Used  Substance and Sexual Activity  . Alcohol use: Yes    Alcohol/week: 0.6 - 1.2 oz    Types: 1 - 2 Glasses of wine per week    Comment: occ  . Drug use: No    . Sexual activity: Yes    Partners: Male    Birth control/protection: Surgical  Other Topics Concern  . Not on file  Social History Narrative   Married. One child.   College-educated, works as a Copywriter, advertising.   Smoke alarm in the home. Wears her seatbelt.   Feels safe in her relationship.   Allergies as of 05/14/2017      Reactions   Ciprocin-fluocin-procin [fluocinolone Acetonide]    SOB   Sulfa Antibiotics    hives      Medication List        Accurate as of 05/14/17  1:30 PM. Always use your most recent med list.          fluticasone-salmeterol 115-21 MCG/ACT inhaler Commonly known as:  ADVAIR HFA Inhale 2 puffs into the lungs 2 (two) times daily.   valACYclovir 500 MG tablet Commonly known as:  VALTREX Take 1 tablet (500 mg total) by mouth daily.       All past medical history, surgical history, allergies, family history, immunizations andmedications were updated in the EMR today and reviewed under the history and medication portions of their EMR.    No results found for this or any previous visit (from the past 2160 hour(s)).  No results found.   ROS: 14 pt review of systems performed and negative (unless mentioned in an HPI)  Objective: BP 108/72 (BP Location: Left Arm, Patient Position: Sitting, Cuff Size: Large)   Pulse 100   Temp 98.3 F (36.8 C)   Resp 20   Ht '5\' 7"'  (1.702 m)   Wt 210 lb 12 oz (95.6 kg)   SpO2 98%   BMI 33.01 kg/m  Gen: Afebrile. No acute distress. Nontoxic in appearance, well-developed, well-nourished,  pleasant, Caucasian female, obese HENT: AT. [place]. Bilateral TM visualized and normal in appearance, normal external auditory canal. MMM, no oral lesions, adequate dentition. Bilateral nares within normal limits. Throat without erythema, ulcerations or exudates. No Cough on exam, no hoarseness on exam. Eyes:Pupils Equal Round Reactive to light, Extraocular movements intact,  Conjunctiva without redness, discharge or  icterus. Neck/lymp/endocrine: Supple, no lymphadenopathy, no thyromegaly CV: RRR no murmur, no edema, +2/4 P posterior tibialis pulses. No carotid bruits. No JVD. Chest: CTAB, no wheeze, rhonchi or crackles. Normal Respiratory effort. Good Air movement. Abd: Soft. Obese. NTND. BS present. No Masses palpated. No hepatosplenomegaly. No rebound tenderness or guarding. Skin: No rashes, purpura or petechiae. Warm and well-perfused. Skin intact. Neuro/Msk:  Normal gait. PERLA. EOMi. Alert. Oriented x3.  Cranial nerves II through XII intact. Muscle strength 5/5 upper/lower extremity. DTRs equal bilaterally. Psych: Normal affect, dress and demeanor. Normal speech. Normal thought content and judgment.   Assessment/plan: Lyly Canizales is a  57 y.o. female present for CPE. HSV-2 (herpes simplex virus 2) infection - Valtrex 500 mg daily provided today, will refill yearly with physical Uncomplicated asthma, unspecified asthma severity, unspecified whether persistent Follows with allergy and asthma specialist. Obesity (BMI 30-39.9) - Diet and exercise encouraged - Lipid panel Vitamin D deficiency - currenlty not on a supplement - VITAMIN D 25 Hydroxy (Vit-D Deficiency, Fractures) Diabetes mellitus screening - HgB A1c Tachycardia - mild on exam today. - TSH Encounter for long-term (current) use of medications - Comp Met (CMET) Screening for deficiency anemia - CBC w/Diff Encounter for preventive health examination Encounter to establish care with new doctor Patient was encouraged to exercise greater than 150 minutes a week. Patient was encouraged to choose a diet filled with fresh fruits and vegetables, and lean meats. AVS provided to patient today for education/recommendation on gender specific health and safety maintenance. Colonoscopy: completed 2017, by Dr. Henrene Pastor, resutls colon polyps. follow up 5 year follow-up. Mammogram: completed: Today, birads 1. Breast center Cervical cancer  screening: No longer indicated Immunizations: tdap patient unsure of date, within last 10 years, Influenza UTD 01/25/2017 (encouraged yearly) Infectious disease screening: Completed Return in about 1 year (around 05/14/2018) for CPE.   Note is dictated utilizing voice recognition software. Although note has been proof read prior to signing, occasional typographical errors still can be missed. If any questions arise, please do not hesitate to call for verification.  Electronically signed by: Howard Pouch, DO [place]

## 2017-05-14 NOTE — Patient Instructions (Signed)
It was nice to meet you today. I have refilled your medicine for 1 year. Follow up with physical yearly.  We will call you with lab results once available.    Health Maintenance, Female Adopting a healthy lifestyle and getting preventive care can go a long way to promote health and wellness. Talk with your health care provider about what schedule of regular examinations is right for you. This is a good chance for you to check in with your provider about disease prevention and staying healthy. In between checkups, there are plenty of things you can do on your own. Experts have done a lot of research about which lifestyle changes and preventive measures are most likely to keep you healthy. Ask your health care provider for more information. Weight and diet Eat a healthy diet  Be sure to include plenty of vegetables, fruits, low-fat dairy products, and lean protein.  Do not eat a lot of foods high in solid fats, added sugars, or salt.  Get regular exercise. This is one of the most important things you can do for your health. ? Most adults should exercise for at least 150 minutes each week. The exercise should increase your heart rate and make you sweat (moderate-intensity exercise). ? Most adults should also do strengthening exercises at least twice a week. This is in addition to the moderate-intensity exercise.  Maintain a healthy weight  Body mass index (BMI) is a measurement that can be used to identify possible weight problems. It estimates body fat based on height and weight. Your health care provider can help determine your BMI and help you achieve or maintain a healthy weight.  For females 20 years of age and older: ? A BMI below 18.5 is considered underweight. ? A BMI of 18.5 to 24.9 is normal. ? A BMI of 25 to 29.9 is considered overweight. ? A BMI of 30 and above is considered obese.  Watch levels of cholesterol and blood lipids  You should start having your blood tested for  lipids and cholesterol at 55 years of age, then have this test every 5 years.  You may need to have your cholesterol levels checked more often if: ? Your lipid or cholesterol levels are high. ? You are older than 55 years of age. ? You are at high risk for heart disease.  Cancer screening Lung Cancer  Lung cancer screening is recommended for adults 55-80 years old who are at high risk for lung cancer because of a history of smoking.  A yearly low-dose CT scan of the lungs is recommended for people who: ? Currently smoke. ? Have quit within the past 15 years. ? Have at least a 30-pack-year history of smoking. A pack year is smoking an average of one pack of cigarettes a day for 1 year.  Yearly screening should continue until it has been 15 years since you quit.  Yearly screening should stop if you develop a health problem that would prevent you from having lung cancer treatment.  Breast Cancer  Practice breast self-awareness. This means understanding how your breasts normally appear and feel.  It also means doing regular breast self-exams. Let your health care provider know about any changes, no matter how small.  If you are in your 20s or 30s, you should have a clinical breast exam (CBE) by a health care provider every 1-3 years as part of a regular health exam.  If you are 40 or older, have a CBE every year. Also consider   having a breast X-ray (mammogram) every year.  If you have a family history of breast cancer, talk to your health care provider about genetic screening.  If you are at high risk for breast cancer, talk to your health care provider about having an MRI and a mammogram every year.  Breast cancer gene (BRCA) assessment is recommended for women who have family members with BRCA-related cancers. BRCA-related cancers include: ? Breast. ? Ovarian. ? Tubal. ? Peritoneal cancers.  Results of the assessment will determine the need for genetic counseling and BRCA1 and  BRCA2 testing.  Cervical Cancer Your health care provider may recommend that you be screened regularly for cancer of the pelvic organs (ovaries, uterus, and vagina). This screening involves a pelvic examination, including checking for microscopic changes to the surface of your cervix (Pap test). You may be encouraged to have this screening done every 3 years, beginning at age 21.  For women ages 30-65, health care providers may recommend pelvic exams and Pap testing every 3 years, or they may recommend the Pap and pelvic exam, combined with testing for human papilloma virus (HPV), every 5 years. Some types of HPV increase your risk of cervical cancer. Testing for HPV may also be done on women of any age with unclear Pap test results.  Other health care providers may not recommend any screening for nonpregnant women who are considered low risk for pelvic cancer and who do not have symptoms. Ask your health care provider if a screening pelvic exam is right for you.  If you have had past treatment for cervical cancer or a condition that could lead to cancer, you need Pap tests and screening for cancer for at least 20 years after your treatment. If Pap tests have been discontinued, your risk factors (such as having a new sexual partner) need to be reassessed to determine if screening should resume. Some women have medical problems that increase the chance of getting cervical cancer. In these cases, your health care provider may recommend more frequent screening and Pap tests.  Colorectal Cancer  This type of cancer can be detected and often prevented.  Routine colorectal cancer screening usually begins at 55 years of age and continues through 55 years of age.  Your health care provider may recommend screening at an earlier age if you have risk factors for colon cancer.  Your health care provider may also recommend using home test kits to check for hidden blood in the stool.  A small camera at the  end of a tube can be used to examine your colon directly (sigmoidoscopy or colonoscopy). This is done to check for the earliest forms of colorectal cancer.  Routine screening usually begins at age 50.  Direct examination of the colon should be repeated every 5-10 years through 55 years of age. However, you may need to be screened more often if early forms of precancerous polyps or small growths are found.  Skin Cancer  Check your skin from head to toe regularly.  Tell your health care provider about any new moles or changes in moles, especially if there is a change in a mole's shape or color.  Also tell your health care provider if you have a mole that is larger than the size of a pencil eraser.  Always use sunscreen. Apply sunscreen liberally and repeatedly throughout the day.  Protect yourself by wearing long sleeves, pants, a wide-brimmed hat, and sunglasses whenever you are outside.  Heart disease, diabetes, and high blood pressure    High blood pressure causes heart disease and increases the risk of stroke. High blood pressure is more likely to develop in: ? People who have blood pressure in the high end of the normal range (130-139/85-89 mm Hg). ? People who are overweight or obese. ? People who are African American.  If you are 45-54 years of age, have your blood pressure checked every 3-5 years. If you are 72 years of age or older, have your blood pressure checked every year. You should have your blood pressure measured twice-once when you are at a hospital or clinic, and once when you are not at a hospital or clinic. Record the average of the two measurements. To check your blood pressure when you are not at a hospital or clinic, you can use: ? An automated blood pressure machine at a pharmacy. ? A home blood pressure monitor.  If you are between 47 years and 43 years old, ask your health care provider if you should take aspirin to prevent strokes.  Have regular diabetes  screenings. This involves taking a blood sample to check your fasting blood sugar level. ? If you are at a normal weight and have a low risk for diabetes, have this test once every three years after 55 years of age. ? If you are overweight and have a high risk for diabetes, consider being tested at a younger age or more often. Preventing infection Hepatitis B  If you have a higher risk for hepatitis B, you should be screened for this virus. You are considered at high risk for hepatitis B if: ? You were born in a country where hepatitis B is common. Ask your health care provider which countries are considered high risk. ? Your parents were born in a high-risk country, and you have not been immunized against hepatitis B (hepatitis B vaccine). ? You have HIV or AIDS. ? You use needles to inject street drugs. ? You live with someone who has hepatitis B. ? You have had sex with someone who has hepatitis B. ? You get hemodialysis treatment. ? You take certain medicines for conditions, including cancer, organ transplantation, and autoimmune conditions.  Hepatitis C  Blood testing is recommended for: ? Everyone born from [birth year] through 1963. ? Anyone with known risk factors for hepatitis C.  Sexually transmitted infections (STIs)  You should be screened for sexually transmitted infections (STIs) including gonorrhea and chlamydia if: ? You are sexually active and are younger than 55 years of age. ? You are older than 56 years of age and your health care provider tells you that you are at risk for this type of infection. ? Your sexual activity has changed since you were last screened and you are at an increased risk for chlamydia or gonorrhea. Ask your health care provider if you are at risk.  If you do not have HIV, but are at risk, it may be recommended that you take a prescription medicine daily to prevent HIV infection. This is called pre-exposure prophylaxis (PrEP). You are considered at risk  if: ? You are sexually active and do not regularly use condoms or know the HIV status of your partner(s). ? You take drugs by injection. ? You are sexually active with a partner who has HIV.  Talk with your health care provider about whether you are at high risk of being infected with HIV. If you choose to begin PrEP, you should first be tested for HIV. You should then be tested every 3 months  for as long as you are taking PrEP. Pregnancy  If you are premenopausal and you may become pregnant, ask your health care provider about preconception counseling.  If you may become pregnant, take 400 to 800 micrograms (mcg) of folic acid every day.  If you want to prevent pregnancy, talk to your health care provider about birth control (contraception). Osteoporosis and menopause  Osteoporosis is a disease in which the bones lose minerals and strength with aging. This can result in serious bone fractures. Your risk for osteoporosis can be identified using a bone density scan.  If you are 48 years of age or older, or if you are at risk for osteoporosis and fractures, ask your health care provider if you should be screened.  Ask your health care provider whether you should take a calcium or vitamin D supplement to lower your risk for osteoporosis.  Menopause may have certain physical symptoms and risks.  Hormone replacement therapy may reduce some of these symptoms and risks. Talk to your health care provider about whether hormone replacement therapy is right for you. Follow these instructions at home:  Schedule regular health, dental, and eye exams.  Stay current with your immunizations.  Do not use any tobacco products including cigarettes, chewing tobacco, or electronic cigarettes.  If you are pregnant, do not drink alcohol.  If you are breastfeeding, limit how much and how often you drink alcohol.  Limit alcohol intake to no more than 1 drink per day for nonpregnant women. One drink  equals 12 ounces of beer, 5 ounces of wine, or 1 ounces of hard liquor.  Do not use street drugs.  Do not share needles.  Ask your health care provider for help if you need support or information about quitting drugs.  Tell your health care provider if you often feel depressed.  Tell your health care provider if you have ever been abused or do not feel safe at home. This information is not intended to replace advice given to you by your health care provider. Make sure you discuss any questions you have with your health care provider. Document Released: 10/27/2010 Document Revised: 09/19/2015 Document Reviewed: 01/15/2015 Elsevier Interactive Patient Education  2018 [employer].   Please help [place] help you:  We are honored you have chosen [facility] for your Primary Care home. Below you will find basic instructions that you may need to access in the future. Please help [place] help you by reading the instructions, which cover many of the frequent questions we experience.   Prescription refills and request:  -In order to allow more efficient response time, please call your pharmacy for all refills. They will forward the request electronically to [place]. This allows for the quickest possible response. Request left on a nurse line can take longer to refill, since these are checked as time allows between office patients and other phone calls.  - refill request can take up to 3-5 working days to complete.  - If request is sent electronically and request is appropiate, it is usually completed in 1-2 business days.  - all patients will need to be seen routinely for all chronic medical conditions requiring prescription medications (see follow-up below). If you are overdue for follow up on your condition, you will be asked to make an appointment and we will call in enough medication to cover you until your appointment (up to 30 days).  - all controlled substances will require a face to face visit to  request/refill.  -  if you desire your prescriptions to go through a new pharmacy, and have an active script at original pharmacy, you will need to call your pharmacy and have scripts transferred to new pharmacy. This is completed between the pharmacy locations and not by your provider.    Results: If any images or labs were ordered, it can take up to 1 week to get results depending on the test ordered and the lab/facility running and resulting the test. - Normal or stable results, which do not need further discussion, may be released to your mychart immediately with attached note to you. A call may not be generated for normal results. Please make certain to sign up for mychart. If you have questions on how to activate your mychart you can call the front office.  - If your results need further discussion, our office will attempt to contact you via phone, and if unable to reach you after 2 attempts, we will release your abnormal result to your mychart with instructions.  - All results will be automatically released in mychart after 1 week.  - Your provider will provide you with explanation and instruction on all relevant material in your results. Please keep in mind, results and labs may appear confusing or abnormal to the untrained eye, but it does not mean they are actually abnormal for you personally. If you have any questions about your results that are not covered, or you desire more detailed explanation than what was provided, you should make an appointment with your provider to do so.   Our office handles many outgoing and incoming calls daily. If we have not contacted you within 1 week about your results, please check your mychart to see if there is a message first and if not, then contact our office.  In helping with this matter, you help decrease call volume, and therefore allow us to be able to respond to patients needs more efficiently.   Acute office visits (sick visit):  An acute visit is  intended for a new problem and are scheduled in shorter time slots to allow schedule openings for patients with new problems. This is the appropriate visit to discuss a new problem. In order to provide you with excellent quality medical care with proper time for you to explain your problem, have an exam and receive treatment with instructions, these appointments should be limited to one new problem per visit. If you experience a new problem, in which you desire to be addressed, please make an acute office visit, we save openings on the schedule to accommodate you. Please do not save your new problem for any other type of visit, let us take care of it properly and quickly for you.   Follow up visits:  Depending on your condition(s) your provider will need to see you routinely in order to provide you with quality care and prescribe medication(s). Most chronic conditions (Example: hypertension, Diabetes, depression/anxiety... etc), require visits a couple times a year. Your provider will instruct you on proper follow up for your personal medical conditions and history. Please make certain to make follow up appointments for your condition as instructed. Failing to do so could result in lapse in your medication treatment/refills. If you request a refill, and are overdue to be seen on a condition, we will always provide you with a 30 day script (once) to allow you time to schedule.    Medicare wellness (well visit): - we have a wonderful Nurse (Kim), that will meet with you   and provide you will yearly medicare wellness visits. These visits should occur yearly (can not be scheduled less than 1 calendar year apart) and cover preventive health, immunizations, advance directives and screenings you are entitled to yearly through your medicare benefits. Do not miss out on your entitled benefits, this is when medicare will pay for these benefits to be ordered for you.  These are strongly encouraged by your provider and is  the appropriate type of visit to make certain you are up to date with all preventive health benefits. If you have not had your medicare wellness exam in the last 12 months, please make certain to schedule one by calling the office and schedule your medicare wellness with Kim as soon as possible.   Yearly physical (well visit):  - Adults are recommended to be seen yearly for physicals. Check with your insurance and date of your last physical, most insurances require one calendar year between physicals. Physicals include all preventive health topics, screenings, medical exam and labs that are appropriate for gender/age and history. You may have fasting labs needed at this visit. This is a well visit (not a sick visit), new problems should not be covered during this visit (see acute visit).  - Pediatric patients are seen more frequently when they are younger. Your provider will advise you on well child visit timing that is appropriate for your their age. - This is not a medicare wellness visit. Medicare wellness exams do not have an exam portion to the visit. Some medicare companies allow for a physical, some do not allow a yearly physical. If your medicare allows a yearly physical you can schedule the medicare wellness with our nurse Kim and have your physical with your provider after, on the same day. Please check with insurance for your full benefits.   Late Policy/No Shows:  - all new patients should arrive 15-30 minutes earlier than appointment to allow us time  to  obtain all personal demographics,  insurance information and for you to complete office paperwork. - All established patients should arrive 10-15 minutes earlier than appointment time to update all information and be checked in .  - In our best efforts to run on time, if you are late for your appointment you will be asked to either reschedule or if able, we will work you back into the schedule. There will be a wait time to work you back in  the schedule,  depending on availability.  - If you are unable to make it to your appointment as scheduled, please call 24 hours ahead of time to allow us to fill the time slot with someone else who needs to be seen. If you do not cancel your appointment ahead of time, you may be charged a no show fee.     

## 2017-05-18 ENCOUNTER — Telehealth: Payer: Self-pay | Admitting: Family Medicine

## 2017-05-18 DIAGNOSIS — E559 Vitamin D deficiency, unspecified: Secondary | ICD-10-CM

## 2017-05-18 MED ORDER — VITAMIN D (ERGOCALCIFEROL) 1.25 MG (50000 UNIT) PO CAPS: 50000.0000 [IU] | ORAL_CAPSULE | ORAL | 0 refills | Status: DC

## 2017-05-18 NOTE — Telephone Encounter (Signed)
Sent Rx for Vit D to pharmacy

## 2017-05-18 NOTE — Telephone Encounter (Signed)
Copied from [facility] [id number]. Topic: Quick Communication - See Telephone Encounter >> May 18, 2017  8:09 AM Synthia Innocent wrote: CRM for notification. See Telephone encounter for:  Patient states that she went to [facility] to pick up VIT D script, they have not received it. Please advise 05/18/17.

## 2017-06-04 DIAGNOSIS — H02839 Dermatochalasis of unspecified eye, unspecified eyelid: Secondary | ICD-10-CM | POA: Diagnosis not present

## 2017-06-04 DIAGNOSIS — H25043 Posterior subcapsular polar age-related cataract, bilateral: Secondary | ICD-10-CM | POA: Diagnosis not present

## 2017-06-04 DIAGNOSIS — H2513 Age-related nuclear cataract, bilateral: Secondary | ICD-10-CM | POA: Diagnosis not present

## 2017-06-04 DIAGNOSIS — H25013 Cortical age-related cataract, bilateral: Secondary | ICD-10-CM | POA: Diagnosis not present

## 2017-08-16 ENCOUNTER — Other Ambulatory Visit: Payer: Self-pay

## 2017-08-16 DIAGNOSIS — E559 Vitamin D deficiency, unspecified: Secondary | ICD-10-CM

## 2017-08-16 NOTE — Telephone Encounter (Signed)
Sent a message via mychart to pt that her Vit D request from [facility] was not authorized per PCP advise to continue Vit D 1000u OTC after completion of Prescribe Vitamin D 50,000unit

## 2017-08-19 DIAGNOSIS — D485 Neoplasm of uncertain behavior of skin: Secondary | ICD-10-CM | POA: Diagnosis not present

## 2017-08-19 DIAGNOSIS — D225 Melanocytic nevi of trunk: Secondary | ICD-10-CM | POA: Diagnosis not present

## 2017-08-27 DIAGNOSIS — Z961 Presence of intraocular lens: Secondary | ICD-10-CM | POA: Diagnosis not present

## 2017-08-27 DIAGNOSIS — H2512 Age-related nuclear cataract, left eye: Secondary | ICD-10-CM | POA: Diagnosis not present

## 2017-08-27 DIAGNOSIS — H2511 Age-related nuclear cataract, right eye: Secondary | ICD-10-CM | POA: Diagnosis not present

## 2017-09-02 DIAGNOSIS — H2513 Age-related nuclear cataract, bilateral: Secondary | ICD-10-CM | POA: Diagnosis not present

## 2017-09-24 DIAGNOSIS — J039 Acute tonsillitis, unspecified: Secondary | ICD-10-CM | POA: Diagnosis not present

## 2017-10-15 ENCOUNTER — Ambulatory Visit: Payer: BLUE CROSS/BLUE SHIELD | Admitting: Family Medicine

## 2017-10-15 DIAGNOSIS — H2511 Age-related nuclear cataract, right eye: Secondary | ICD-10-CM | POA: Diagnosis not present

## 2017-10-15 DIAGNOSIS — Z0289 Encounter for other administrative examinations: Secondary | ICD-10-CM

## 2017-11-05 DIAGNOSIS — H2511 Age-related nuclear cataract, right eye: Secondary | ICD-10-CM | POA: Diagnosis not present

## 2017-11-05 DIAGNOSIS — H35361 Drusen (degenerative) of macula, right eye: Secondary | ICD-10-CM | POA: Diagnosis not present

## 2017-12-02 ENCOUNTER — Telehealth: Payer: Self-pay

## 2017-12-06 NOTE — Telephone Encounter (Signed)
Error

## 2017-12-23 NOTE — Progress Notes (Signed)
[facility] [place], [place] [address] Phone: [phone] Subjective:   [name], am serving as a scribe for Dr. Hulan Saas.  I'm seeing this patient by the request  of:  Kuneff, Renee A, DO   CC: Neck and thumb pain  BJY:[id number]  Jade [name] is a 57 y.o. female coming in with complaint of neck and thumb pain. 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 Patient has right CMC joint pain. Has been told she has arthritis. She has a brace that she uses with work but that is not helping any more. Has had an injection previously. She also uses Advil to alleviate her pain.   Patient also complains of neck pain and left shoulder pain. Patent has burning and pain in the neck and shoulder. Has to keep her arm in an abducted position with work and cannot move out of that position for long periods of time. Pain increases over the work day. No pain once she gets home. Does not perform any posture exercises. Does occasionally stretch.       Past Medical History:  Diagnosis Date  . Anxiety   . Asthma    uses inhaler prn  . Chest pain   . Colon polyps   . Diarrhea    with constipation while taking diet pills (Alli)  . History of rectal bleeding     2 times in past  . HSV-2 (herpes simplex virus 2) infection   . Palpitations    in past/due to Advil pm  . Vitamin D deficiency    Past Surgical History:  Procedure Laterality Date  . ABDOMINAL HYSTERECTOMY  1998   fibroids. partial  . CESAREAN SECTION     1 time  . COLONOSCOPY W/ POLYPECTOMY  2017  . RESECTOSCOPIC MYOMECTOMY     Social History   Socioeconomic History  . Marital status: Married    Spouse name: Not on file  . Number of children: 1  . Years of education: Not on file  . Highest education level: Not on file  Occupational History  . Occupation: Copywriter, advertising  Social Needs  . Financial resource strain: Not on file  . Food insecurity:    Worry: Not on  file    Inability: Not on file  . Transportation needs:    Medical: Not on file    Non-medical: Not on file  Tobacco Use  . Smoking status: Never Smoker  . Smokeless tobacco: Never Used  Substance and Sexual Activity  . Alcohol use: Yes    Alcohol/week: 1.0 - 2.0 standard drinks    Types: 1 - 2 Glasses of wine per week    Comment: occ  . Drug use: No  . Sexual activity: Yes    Partners: Male    Birth control/protection: Surgical  Lifestyle  . Physical activity:    Days per week: Not on file    Minutes per session: Not on file  . Stress: Not on file  Relationships  . Social connections:    Talks on phone: Not on file    Gets together: Not on file    Attends religious service: Not on file    Active member of club or organization: Not on file    Attends meetings of clubs or organizations: Not on file    Relationship status: Not on file  Other Topics Concern  . Not on file  Social History Narrative   Married.  One child.   College-educated, works as a Copywriter, advertising.   Smoke alarm in the home. Wears her seatbelt.   Feels safe in her relationship.   Allergies  Allergen Reactions  . Ciprocin-Fluocin-Procin [Fluocinolone Acetonide]     SOB  . Sulfa Antibiotics     hives   Family History  Problem Relation Age of Onset  . Diabetes Father   . Hypertension Father   . Cancer Father        prostate  . Heart disease Father   . Atrial fibrillation Sister   . Atrial fibrillation Brother   . Thyroid disease Sister   . Diabetes Paternal Uncle   . Cancer Paternal Grandfather   . Colon cancer Neg Hx       Current Outpatient Medications (Respiratory):  .  fluticasone-salmeterol (ADVAIR HFA) 115-21 MCG/ACT inhaler, Inhale 2 puffs into the lungs 2 (two) times daily.    Current Outpatient Medications (Other):  .  valACYclovir (VALTREX) 500 MG tablet, Take 1 tablet (500 mg total) by mouth daily. .  Vitamin D, Ergocalciferol, (DRISDOL) 50000 units CAPS capsule, Take 1  capsule (50,000 Units total) by mouth every 7 (seven) days.    Past medical history, social, surgical and family history all reviewed in electronic medical record.  No pertanent information unless stated regarding to the chief complaint.   Review of Systems:  No headache, visual changes, nausea, vomiting, diarrhea, constipation, dizziness, abdominal pain, skin rash, fevers, chills, night sweats, weight loss, swollen lymph nodes, body aches, joint swelling, chest pain, shortness of breath, mood changes.  Normal muscle aches  Objective  Blood pressure 112/80, pulse 75, height 5\' 7"  (1.702 m), SpO2 95 %. Systems examined below as of    General: No apparent distress alert and oriented x3 mood and affect normal, dressed appropriately.  HEENT: Pupils equal, extraocular movements intact  Respiratory: Patient's speak in full sentences and does not appear short of breath  Cardiovascular: No lower extremity edema, non tender, no erythema  Skin: Warm dry intact with no signs of infection or rash on extremities or on axial skeleton.  Abdomen: Soft nontender  Neuro: Cranial nerves II through XII are intact, neurovascularly intact in all extremities with 2+ DTRs and 2+ pulses.  Lymph: No lymphadenopathy of posterior or anterior cervical chain or axillae bilaterally.  Gait normal with good balance and coordination.  MSK:  Non tender with full range of motion and good stability and symmetric strength and tone of shoulders, elbows, wrist, hip, knee and ankles bilaterally.  Neck: Inspection loss of lordosis. No palpable stepoffs. Negative Spurling's maneuver. Limited range of motion lacking last 5 to 10 degrees of rotation and extension.  Significant tightness of the left trapezius Grip strength and sensation normal in bilateral hands Strength good C4 to T1 distribution No sensory change to C4 to T1 Negative Hoffman sign bilaterally Reflexes normal  Right hand exam does show the patient does have a  positive grind test with some mild thenar eminence wasting on the right side.  Crepitus noted.  Strength is 4+ out of 5\\Osteopathic findings  Osteopathic findings C4 flexed rotated and side bent left C6 flexed rotated and side bent left T3 extended rotated and side bent left inhaled third rib T9 extended rotated and side bent left L2 flexed rotated and side bent right Sacrum right on right  Procedure: Real-time Ultrasound Guided Injection of right CMC joint Device: GE Logiq Q7 Ultrasound guided injection is preferred based studies that show increased duration, increased effect,  greater accuracy, decreased procedural pain, increased response rate, and decreased cost with ultrasound guided versus blind injection.  Verbal informed consent obtained.  Time-out conducted.  Noted no overlying erythema, induration, or other signs of local infection.  Skin prepped in a sterile fashion.  Local anesthesia: Topical Ethyl chloride.  With sterile technique and under real time ultrasound guidance: With a 25-gauge half inch needle was injected in the right [facility] joint with a total of 0.5 cc of 0.5% Marcaine and 0.5 cc of Kenalog 40 mg/mL Completed without difficulty  Pain immediately resolved suggesting accurate placement of the medication.  Advised to call if fevers/chills, erythema, induration, drainage, or persistent bleeding.  Images permanently stored and available for review in the ultrasound unit.  Impression: Technically successful ultrasound guided injection.    Impression and Recommendations:     This case required medical decision making of moderate complexity. The above documentation has been reviewed and is accurate and complete Lyndal Pulley, DO       Note: This dictation was prepared with Dragon dictation along with smaller phrase technology. Any transcriptional errors that result from this process are unintentional.

## 2017-12-24 ENCOUNTER — Ambulatory Visit: Payer: Self-pay

## 2017-12-24 ENCOUNTER — Encounter: Payer: Self-pay | Admitting: Family Medicine

## 2017-12-24 ENCOUNTER — Ambulatory Visit: Payer: BLUE CROSS/BLUE SHIELD | Admitting: Family Medicine

## 2017-12-24 VITALS — BP 112/80 | HR 75 | Ht 67.0 in

## 2017-12-24 DIAGNOSIS — M1811 Unilateral primary osteoarthritis of first carpometacarpal joint, right hand: Secondary | ICD-10-CM | POA: Insufficient documentation

## 2017-12-24 DIAGNOSIS — R293 Abnormal posture: Secondary | ICD-10-CM | POA: Diagnosis not present

## 2017-12-24 DIAGNOSIS — M79644 Pain in right finger(s): Secondary | ICD-10-CM

## 2017-12-24 DIAGNOSIS — M9981 Other biomechanical lesions of cervical region: Secondary | ICD-10-CM

## 2017-12-24 DIAGNOSIS — M999 Biomechanical lesion, unspecified: Secondary | ICD-10-CM | POA: Diagnosis not present

## 2017-12-24 NOTE — Assessment & Plan Note (Signed)
Decision today to treat with OMT was based on Physical Exam  After verbal consent patient was treated with HVLA, ME, FPR techniques in cervical, thoracic, rib lumbar and sacral areas  Patient tolerated the procedure well with improvement in symptoms  Patient given exercises, stretches and lifestyle modifications  See medications in patient instructions if given  Patient will follow up in 4-6 weeks 

## 2017-12-24 NOTE — Assessment & Plan Note (Signed)
Injected today.  And secondary to some of her job performance.  Discussed icing regimen at home exercises.  Continue with vitamin D supplementation.  Discussed icing regimen and home exercises.  Follow-up again with me in 4 weeks

## 2017-12-24 NOTE — Patient Instructions (Signed)
Good to see you  Ice 20 minutes 2 times daily. Usually after activity and before bed. pennsaid pinkie amount topically 2 times daily as needed.  Exercises 3 times a week.  Stay active Try to keep posture and support elbows See me again in 3-4 weeks and start the manipulation

## 2017-12-24 NOTE — Assessment & Plan Note (Signed)
Poor posture causing more muscle tightness.  We discussed ergonomics, home exercise, responding well to manipulation.  Discussed icing regimen.  Discussed vitamin D supplementation and topical anti-inflammatories.  Work with her trainer to learn home exercises in greater detail.  Follow-up again in 4 to 6 weeks

## 2018-01-27 ENCOUNTER — Ambulatory Visit: Payer: BLUE CROSS/BLUE SHIELD | Admitting: Family Medicine

## 2018-03-03 ENCOUNTER — Ambulatory Visit: Payer: BLUE CROSS/BLUE SHIELD | Admitting: Family Medicine

## 2018-05-21 ENCOUNTER — Other Ambulatory Visit: Payer: Self-pay | Admitting: Family Medicine

## 2018-05-23 NOTE — Telephone Encounter (Signed)
Sent patient My Chart message to make an appt for any refills

## 2018-05-23 NOTE — Telephone Encounter (Signed)
Tried to call pt on cell phone and leave message, but was unable to do so due to VM messing up. Will try patient again.

## 2018-05-23 NOTE — Telephone Encounter (Signed)
Received a refill request for pts valtrex. She  Has not been seen  By this provider for over year. Please encourage her to make an appt for her physical and we can refill this for her at that time.

## 2018-05-25 NOTE — Telephone Encounter (Signed)
Called and left detailed message stating mychart message was sent and that we were unable to refill medications until she came in to see the MD for a annual exam.

## 2018-07-01 ENCOUNTER — Other Ambulatory Visit: Payer: Self-pay | Admitting: Obstetrics & Gynecology

## 2018-07-01 DIAGNOSIS — Z1231 Encounter for screening mammogram for malignant neoplasm of breast: Secondary | ICD-10-CM

## 2018-08-04 DIAGNOSIS — J3089 Other allergic rhinitis: Secondary | ICD-10-CM | POA: Diagnosis not present

## 2018-08-04 DIAGNOSIS — J301 Allergic rhinitis due to pollen: Secondary | ICD-10-CM | POA: Diagnosis not present

## 2018-08-04 DIAGNOSIS — J309 Allergic rhinitis, unspecified: Secondary | ICD-10-CM | POA: Diagnosis not present

## 2018-08-04 DIAGNOSIS — J4521 Mild intermittent asthma with (acute) exacerbation: Secondary | ICD-10-CM | POA: Diagnosis not present

## 2018-08-25 ENCOUNTER — Other Ambulatory Visit: Payer: Self-pay

## 2018-08-26 ENCOUNTER — Ambulatory Visit: Payer: BLUE CROSS/BLUE SHIELD | Admitting: Obstetrics & Gynecology

## 2018-08-26 ENCOUNTER — Ambulatory Visit: Payer: BLUE CROSS/BLUE SHIELD

## 2018-08-30 ENCOUNTER — Other Ambulatory Visit: Payer: Self-pay

## 2018-08-30 ENCOUNTER — Ambulatory Visit (INDEPENDENT_AMBULATORY_CARE_PROVIDER_SITE_OTHER): Payer: BLUE CROSS/BLUE SHIELD | Admitting: Obstetrics & Gynecology

## 2018-08-30 ENCOUNTER — Encounter: Payer: Self-pay | Admitting: Obstetrics & Gynecology

## 2018-08-30 VITALS — BP 110/80 | Ht 67.0 in | Wt 207.0 lb

## 2018-08-30 DIAGNOSIS — Z780 Asymptomatic menopausal state: Secondary | ICD-10-CM | POA: Diagnosis not present

## 2018-08-30 DIAGNOSIS — Z90710 Acquired absence of both cervix and uterus: Secondary | ICD-10-CM | POA: Diagnosis not present

## 2018-08-30 DIAGNOSIS — Z8619 Personal history of other infectious and parasitic diseases: Secondary | ICD-10-CM | POA: Diagnosis not present

## 2018-08-30 DIAGNOSIS — R7309 Other abnormal glucose: Secondary | ICD-10-CM | POA: Diagnosis not present

## 2018-08-30 DIAGNOSIS — Z6833 Body mass index (BMI) 33.0-33.9, adult: Secondary | ICD-10-CM | POA: Diagnosis not present

## 2018-08-30 DIAGNOSIS — Z01419 Encounter for gynecological examination (general) (routine) without abnormal findings: Secondary | ICD-10-CM | POA: Diagnosis not present

## 2018-08-30 DIAGNOSIS — E6609 Other obesity due to excess calories: Secondary | ICD-10-CM

## 2018-08-30 DIAGNOSIS — M545 Low back pain: Secondary | ICD-10-CM | POA: Diagnosis not present

## 2018-08-30 DIAGNOSIS — Z1272 Encounter for screening for malignant neoplasm of vagina: Secondary | ICD-10-CM

## 2018-08-30 DIAGNOSIS — M47816 Spondylosis without myelopathy or radiculopathy, lumbar region: Secondary | ICD-10-CM | POA: Insufficient documentation

## 2018-08-30 DIAGNOSIS — Z6832 Body mass index (BMI) 32.0-32.9, adult: Secondary | ICD-10-CM

## 2018-08-30 MED ORDER — VALACYCLOVIR HCL 500 MG PO TABS: 500.0000 mg | ORAL_TABLET | Freq: Every day | ORAL | 4 refills | Status: DC

## 2018-08-30 NOTE — Patient Instructions (Signed)
1. Encounter for Papanicolaou smear of vagina as part of routine gynecological examination Gynecologic exam status post total hysterectomy and menopause.  Pap reflex done today.  Breast exam normal.  Will schedule screening mammogram.  Fasting health labs done here today.  Last colonoscopy in June 2017 was within normal limits. - CBC - Comp Met (CMET) - Lipid panel - TSH - VITAMIN D 25 Hydroxy (Vit-D Deficiency, Fractures)  2. S/P total hysterectomy  3. Postmenopause Well on no hormone replacement therapy.  Recommend vitamin D supplements, calcium intake of 1200 to 1500 mg daily and regular weightbearing physical activities. Bone density in November 2014 was normal.  Will repeat a bone density at age 7.  52. H/O herpes genitalis Doing well on valacyclovir 500 mg tablet daily for prophylaxis with rare recurrences.  Decision to continue on valacyclovir prophylaxis.  Prescription sent to pharmacy.  5. Class 1 obesity due to excess calories without serious comorbidity with body mass index (BMI) of 32.0 to 32.9 in adult Recommend a lower calorie/carb diet such as [employer].  Aerobic physical activities 5 times a week and weightlifting every 2 days.  Other orders - VITAMIN D PO; Take 1 tablet by mouth daily. - valACYclovir (VALTREX) 500 MG tablet; Take 1 tablet (500 mg total) by mouth daily.  Jade [name], it was a pleasure seeing you today!  I will inform you of your results as soon as they are available.

## 2018-08-30 NOTE — Progress Notes (Signed)
Jade [name] 08-06-1960 [id number]   History:    58 y.o. G1P1L1 Married.  Daughter has 1 child.  RP:  Established patient presenting for annual gyn exam   HPI: S/P Total Hysterectomy.  Menopause, well on no HRT.  No pelvic pain.  No pain with IC.  Rare recurrences of Genital Herpes on Valacyclovir prophylaxis.  Urine/BMs normal.  Breasts normal.  BMI 32.42.  Enjoys gardening.  Will do fasting labs here today.  Past medical history,surgical history, family history and social history were all reviewed and documented in the EPIC chart.  Gynecologic History No LMP recorded. Patient has had a hysterectomy. Contraception: status post hysterectomy Last Pap: 09/2011. Results were: Negative Last mammogram: 04/2017. Results were: Negative Bone Density: 02/2013 Normal Colonoscopy: 09/2015 wnl  Obstetric History OB History  Gravida Para Term Preterm AB Living  _0 SAB TAB Ectopic Multiple Live Births          1    # Outcome Date GA Lbr Len/2nd Weight Sex Delivery Anes PTL Lv  1 Term     F CS-Unspec  N LIV     ROS: A ROS was performed and pertinent positives and negatives are included in the history.  GENERAL: No fevers or chills. HEENT: No change in vision, no earache, sore throat or sinus congestion. NECK: No pain or stiffness. CARDIOVASCULAR: No chest pain or pressure. No palpitations. PULMONARY: No shortness of breath, cough or wheeze. GASTROINTESTINAL: No abdominal pain, nausea, vomiting or diarrhea, melena or bright red blood per rectum. GENITOURINARY: No urinary frequency, urgency, hesitancy or dysuria. MUSCULOSKELETAL: No joint or muscle pain, no back pain, no recent trauma. DERMATOLOGIC: No rash, no itching, no lesions. ENDOCRINE: No polyuria, polydipsia, no heat or cold intolerance. No recent change in weight. HEMATOLOGICAL: No anemia or easy bruising or bleeding. NEUROLOGIC: No headache, seizures, numbness, tingling or weakness. PSYCHIATRIC: No depression, no loss of  interest in normal activity or change in sleep pattern.     Exam:   BP 110/80 (BP Location: Right Arm, Patient Position: Sitting, Cuff Size: Normal)   Ht _1  (1.702 m)   Wt 207 lb (93.9 kg)   BMI 32.42 kg/m   Body mass index is 32.42 kg/m.  General appearance : Well developed well nourished female. No acute distress HEENT: Eyes: no retinal hemorrhage or exudates,  Neck supple, trachea midline, no carotid bruits, no thyroidmegaly Lungs: Clear to auscultation, no rhonchi or wheezes, or rib retractions  Heart: Regular rate and rhythm, no murmurs or gallops Breast:Examined in sitting and supine position were symmetrical in appearance, no palpable masses or tenderness,  no skin retraction, no nipple inversion, no nipple discharge, no skin discoloration, no axillary or supraclavicular lymphadenopathy Abdomen: no palpable masses or tenderness, no rebound or guarding Extremities: no edema or skin discoloration or tenderness  Pelvic: Vulva: Normal             Vagina: No gross lesions or discharge.  Pap reflex done.  Cervix/Uterus absent  Adnexa  Without masses or tenderness  Anus: Normal   Assessment/Plan:  58 y.o. female for annual exam   1. Encounter for Papanicolaou smear of vagina as part of routine gynecological examination Gynecologic exam status post total hysterectomy and menopause.  Pap reflex done today.  Breast exam normal.  Will schedule screening mammogram.  Fasting health labs done here today.  Last colonoscopy in June 2017 was within normal limits. - CBC - Comp Met (CMET) -  Lipid panel - TSH - VITAMIN D 25 Hydroxy (Vit-D Deficiency, Fractures)  2. S/P total hysterectomy  3. Postmenopause Well on no hormone replacement therapy.  Recommend vitamin D supplements, calcium intake of 1200 to 1500 mg daily and regular weightbearing physical activities. Bone density in November 2014 was normal.  Will repeat a bone density at age 21.  71. H/O herpes genitalis Doing well  on valacyclovir 500 mg tablet daily for prophylaxis with rare recurrences.  Decision to continue on valacyclovir prophylaxis.  Prescription sent to pharmacy.  5. Class 1 obesity due to excess calories without serious comorbidity with body mass index (BMI) of 32.0 to 32.9 in adult Recommend a lower calorie/carb diet such as [employer].  Aerobic physical activities 5 times a week and weightlifting every 2 days.  Other orders - VITAMIN D PO; Take 1 tablet by mouth daily. - valACYclovir (VALTREX) 500 MG tablet; Take 1 tablet (500 mg total) by mouth daily.  Princess Bruins MD, 8:18 AM 08/30/2018

## 2018-08-31 LAB — PAP IG W/ RFLX HPV ASCU

## 2018-09-01 LAB — TEST AUTHORIZATION

## 2018-09-01 LAB — CBC
HCT: 42.3 % (ref 35.0–45.0)
Hemoglobin: 14.4 g/dL (ref 11.7–15.5)
MCH: 29.6 pg (ref 27.0–33.0)
MCHC: 34 g/dL (ref 32.0–36.0)
MCV: 87 fL (ref 80.0–100.0)
MPV: 12.7 fL — ABNORMAL HIGH (ref 7.5–12.5)
Platelets: 258 10*3/uL (ref 140–400)
RBC: 4.86 10*6/uL (ref 3.80–5.10)
RDW: 12.9 % (ref 11.0–15.0)
WBC: 5.7 10*3/uL (ref 3.8–10.8)

## 2018-09-01 LAB — COMPREHENSIVE METABOLIC PANEL
AG Ratio: 1.8 (calc) (ref 1.0–2.5)
ALT: 16 U/L (ref 6–29)
AST: 22 U/L (ref 10–35)
Albumin: 4.4 g/dL (ref 3.6–5.1)
Alkaline phosphatase (APISO): 55 U/L (ref 37–153)
BUN: 11 mg/dL (ref 7–25)
CO2: 27 mmol/L (ref 20–32)
Calcium: 9.7 mg/dL (ref 8.6–10.4)
Chloride: 104 mmol/L (ref 98–110)
Creat: 0.72 mg/dL (ref 0.50–1.05)
Globulin: 2.4 g/dL (calc) (ref 1.9–3.7)
Glucose, Bld: 104 mg/dL — ABNORMAL HIGH (ref 65–99)
Potassium: 4.8 mmol/L (ref 3.5–5.3)
Sodium: 139 mmol/L (ref 135–146)
Total Bilirubin: 0.6 mg/dL (ref 0.2–1.2)
Total Protein: 6.8 g/dL (ref 6.1–8.1)

## 2018-09-01 LAB — LIPID PANEL
Cholesterol: 234 mg/dL — ABNORMAL HIGH (ref <200)
HDL: 66 mg/dL (ref >=50)
LDL Cholesterol (Calc): 144 mg/dL (calc) — ABNORMAL HIGH
Non-HDL Cholesterol (Calc): 168 mg/dL (calc) — ABNORMAL HIGH (ref <130)
Total CHOL/HDL Ratio: 3.5 (calc) (ref <5.0)
Triglycerides: 120 mg/dL (ref <150)

## 2018-09-01 LAB — TSH: TSH: 1.29 mIU/L

## 2018-09-01 LAB — HEMOGLOBIN A1C W/OUT EAG: Hgb A1c MFr Bld: 5.6 % of total Hgb (ref <5.7)

## 2018-09-01 LAB — VITAMIN D 25 HYDROXY (VIT D DEFICIENCY, FRACTURES): Vit D, 25-Hydroxy: 43 ng/mL (ref 30–100)

## 2018-09-30 ENCOUNTER — Ambulatory Visit: Payer: BLUE CROSS/BLUE SHIELD

## 2018-10-20 DIAGNOSIS — M5127 Other intervertebral disc displacement, lumbosacral region: Secondary | ICD-10-CM | POA: Diagnosis not present

## 2018-10-20 DIAGNOSIS — M47816 Spondylosis without myelopathy or radiculopathy, lumbar region: Secondary | ICD-10-CM | POA: Diagnosis not present

## 2018-10-20 DIAGNOSIS — M545 Low back pain: Secondary | ICD-10-CM | POA: Diagnosis not present

## 2018-11-04 DIAGNOSIS — M545 Low back pain: Secondary | ICD-10-CM | POA: Diagnosis not present

## 2018-11-24 ENCOUNTER — Ambulatory Visit: Payer: Self-pay

## 2018-11-25 DIAGNOSIS — Z6833 Body mass index (BMI) 33.0-33.9, adult: Secondary | ICD-10-CM | POA: Diagnosis not present

## 2018-11-25 DIAGNOSIS — R030 Elevated blood-pressure reading, without diagnosis of hypertension: Secondary | ICD-10-CM | POA: Diagnosis not present

## 2018-11-25 DIAGNOSIS — M461 Sacroiliitis, not elsewhere classified: Secondary | ICD-10-CM | POA: Insufficient documentation

## 2019-01-06 ENCOUNTER — Ambulatory Visit
Admission: RE | Admit: 2019-01-06 | Discharge: 2019-01-06 | Disposition: A | Discharge status: Home / Self Care | Payer: BC Managed Care – PPO | Source: Ambulatory Visit | Attending: Obstetrics & Gynecology | Admitting: Obstetrics & Gynecology

## 2019-01-06 ENCOUNTER — Other Ambulatory Visit: Payer: Self-pay

## 2019-01-06 DIAGNOSIS — Z1231 Encounter for screening mammogram for malignant neoplasm of breast: Secondary | ICD-10-CM

## 2019-01-12 DIAGNOSIS — M461 Sacroiliitis, not elsewhere classified: Secondary | ICD-10-CM | POA: Diagnosis not present

## 2019-01-24 ENCOUNTER — Encounter: Payer: Self-pay | Admitting: Gynecology

## 2019-02-02 DIAGNOSIS — Z6833 Body mass index (BMI) 33.0-33.9, adult: Secondary | ICD-10-CM | POA: Diagnosis not present

## 2019-02-16 DIAGNOSIS — M461 Sacroiliitis, not elsewhere classified: Secondary | ICD-10-CM | POA: Diagnosis not present

## 2019-03-20 ENCOUNTER — Telehealth: Payer: Self-pay | Admitting: Family Medicine

## 2019-03-20 NOTE — Telephone Encounter (Signed)
Pts spouse was called and pt was advised Dr Raoul Pitch is off Wednesday and the office is closed for the remainder of the week for the holidays. Appt was scheduled for next week. Pts husband kept asking if we could get labs without seeing the doctor, it was explained that this could not happen and she has not been seen since 04/2017.

## 2019-03-20 NOTE — Telephone Encounter (Signed)
Patient's husband called in. Patient recently had an episode of extreme thirst after eating a lot of sweets. Patient also is experiencing frequent urination, 6 times a night which is unusual for her. Patient had a brief feeling of chest pain Saturday night (03/18/19) that passed quickly. 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  Please call patient's husband back.

## 2019-03-31 ENCOUNTER — Encounter: Payer: Self-pay | Admitting: Family Medicine

## 2019-03-31 ENCOUNTER — Ambulatory Visit (INDEPENDENT_AMBULATORY_CARE_PROVIDER_SITE_OTHER): Payer: BC Managed Care – PPO

## 2019-03-31 ENCOUNTER — Ambulatory Visit: Payer: BC Managed Care – PPO | Admitting: Family Medicine

## 2019-03-31 ENCOUNTER — Other Ambulatory Visit: Payer: Self-pay

## 2019-03-31 VITALS — BP 101/69 | HR 71 | Temp 98.0°F | Resp 16 | Ht 67.0 in | Wt 210.0 lb

## 2019-03-31 DIAGNOSIS — R0789 Other chest pain: Secondary | ICD-10-CM

## 2019-03-31 DIAGNOSIS — Z23 Encounter for immunization: Secondary | ICD-10-CM | POA: Diagnosis not present

## 2019-03-31 DIAGNOSIS — R5383 Other fatigue: Secondary | ICD-10-CM | POA: Diagnosis not present

## 2019-03-31 DIAGNOSIS — R0600 Dyspnea, unspecified: Secondary | ICD-10-CM | POA: Diagnosis not present

## 2019-03-31 DIAGNOSIS — R002 Palpitations: Secondary | ICD-10-CM

## 2019-03-31 DIAGNOSIS — R0609 Other forms of dyspnea: Secondary | ICD-10-CM

## 2019-03-31 DIAGNOSIS — R631 Polydipsia: Secondary | ICD-10-CM

## 2019-03-31 LAB — CBC WITH DIFFERENTIAL/PLATELET
Basophils Absolute: 0.1 10*3/uL (ref 0.0–0.1)
Basophils Relative: 0.9 % (ref 0.0–3.0)
Eosinophils Absolute: 0.1 10*3/uL (ref 0.0–0.7)
Eosinophils Relative: 1.4 % (ref 0.0–5.0)
HCT: 40.3 % (ref 36.0–46.0)
Hemoglobin: 13.5 g/dL (ref 12.0–15.0)
Lymphocytes Relative: 32.9 % (ref 12.0–46.0)
Lymphs Abs: 2.1 10*3/uL (ref 0.7–4.0)
MCHC: 33.4 g/dL (ref 30.0–36.0)
MCV: 88.1 fl (ref 78.0–100.0)
Monocytes Absolute: 0.5 10*3/uL (ref 0.1–1.0)
Monocytes Relative: 7.1 % (ref 3.0–12.0)
Neutro Abs: 3.7 10*3/uL (ref 1.4–7.7)
Neutrophils Relative %: 57.7 % (ref 43.0–77.0)
Platelets: 225 10*3/uL (ref 150.0–400.0)
RBC: 4.58 Mil/uL (ref 3.87–5.11)
RDW: 13.7 % (ref 11.5–15.5)
WBC: 6.5 10*3/uL (ref 4.0–10.5)

## 2019-03-31 LAB — COMPREHENSIVE METABOLIC PANEL
ALT: 11 U/L (ref 0–35)
AST: 14 U/L (ref 0–37)
Albumin: 4.1 g/dL (ref 3.5–5.2)
Alkaline Phosphatase: 49 U/L (ref 39–117)
BUN: 18 mg/dL (ref 6–23)
CO2: 25 mEq/L (ref 19–32)
Calcium: 9.1 mg/dL (ref 8.4–10.5)
Chloride: 104 mEq/L (ref 96–112)
Creatinine, Ser: 0.63 mg/dL (ref 0.40–1.20)
GFR: 97.62 mL/min (ref >60.00)
Glucose, Bld: 91 mg/dL (ref 70–99)
Potassium: 3.8 mEq/L (ref 3.5–5.1)
Sodium: 140 mEq/L (ref 135–145)
Total Bilirubin: 0.6 mg/dL (ref 0.2–1.2)
Total Protein: 6.6 g/dL (ref 6.0–8.3)

## 2019-03-31 LAB — TSH: TSH: 0.89 u[IU]/mL (ref 0.35–4.50)

## 2019-03-31 LAB — VITAMIN B12: Vitamin B-12: 262 pg/mL (ref 211–911)

## 2019-03-31 LAB — VITAMIN D 25 HYDROXY (VIT D DEFICIENCY, FRACTURES): VITD: 28.54 ng/mL — ABNORMAL LOW (ref 30.00–100.00)

## 2019-03-31 MED ORDER — ESCITALOPRAM OXALATE 5 MG PO TABS: 5.0000 mg | ORAL_TABLET | Freq: Every day | ORAL | 0 refills | Status: DC

## 2019-03-31 NOTE — Patient Instructions (Signed)
Please have xray at [facility] today.  We will call you with all results once received.  Start the low dose lexapro daily (can be taken morning or night).  Once labs return we will discuss further plan.

## 2019-03-31 NOTE — Progress Notes (Signed)
This visit occurred during the SARS-CoV-2 public health emergency.  Safety protocols were in place, including screening questions prior to the visit, additional usage of staff PPE, and extensive cleaning of exam room while observing appropriate contact time as indicated for disinfecting solutions.    Jade [name] , 04-15-1961, 58 y.o., female MRN: [id number] Patient Care Team    Relationship Specialty Notifications Start End  Ma Hillock, DO PCP - General Family Medicine  05/14/17   Irene Shipper, MD Consulting Physician Gastroenterology  05/14/17   Etta Quill, [place] Referring Physician Optometry  05/14/17   Valora Piccolo, MD Consulting Physician Allergy  05/14/17     Chief Complaint  Patient presents with   Chest Pain    Pt has been having sharp chest pain off. 2 weeks ago pt states it woke her up at night and was having a hard time breathing. SOB is Worse when exerting herself.      Subjective: Pt presents for an OV with complaints of intermittent sharp chest pains of 3-4 months duration.  Patient reports over the last 2 weeks they have been worsening and have started to occur almost daily.  She reports the pain is central and left chestlocation and  typically only lasts a few seconds.  The pain did wake her one time over the last 2 weeks and on that occasion she felt like her chest was heavy. she had a hard time breathing on that occasion.  She states that last about 5 minutes and was relieved by "talking herself through it. " She did notice that evening she had gone to bed after eating dinner late.  She also notices the discomfort is worse with increased stress.  She denies any radiation of pain to her jaw or extremities.  She endorses fatigue, increased thirst and nocturia.  She endorses shortness of breath with exertion.  She has a personal history of anxiety, asthma in which she used to be prescribed Advair, vitamin D deficiency and palpitations.  She has a family history of  heart disease.  Depression screen PHQ 2/9 05/14/2017  Decreased Interest 0  Down, Depressed, Hopeless 0  PHQ - 2 Score 0    Allergies  Allergen Reactions   Ciprocin-Fluocin-Procin [Fluocinolone Acetonide]     SOB   Sulfa Antibiotics     hives   Social History   Social History Narrative   Married. One child.   College-educated, works as a Copywriter, advertising.   Smoke alarm in the home. Wears her seatbelt.   Feels safe in her relationship.   Past Medical History:  Diagnosis Date   Anxiety    Asthma    uses inhaler prn   Chest pain    Colon polyps    Diarrhea    with constipation while taking diet pills (Alli)   History of rectal bleeding     2 times in past   HSV-2 (herpes simplex virus 2) infection    Palpitations    in past/due to Advil pm   Vitamin D deficiency    Past Surgical History:  Procedure Laterality Date   ABDOMINAL HYSTERECTOMY  1998   fibroids. partial   CESAREAN SECTION     1 time   COLONOSCOPY W/ POLYPECTOMY  2017   RESECTOSCOPIC MYOMECTOMY     Family History  Problem Relation Age of Onset   Diabetes Father    Hypertension Father    Cancer Father        prostate  Heart disease Father    Atrial fibrillation Sister    Atrial fibrillation Brother    Thyroid disease Sister    Diabetes Paternal Uncle    Cancer Paternal Grandfather    Colon cancer Neg Hx    Allergies as of 03/31/2019      Reactions   Ciprocin-fluocin-procin [fluocinolone Acetonide]    SOB   Sulfa Antibiotics    hives      Medication List       Accurate as of March 31, 2019  8:16 AM. If you have any questions, ask your nurse or doctor.        fluticasone-salmeterol 115-21 MCG/ACT inhaler Commonly known as: ADVAIR HFA Inhale 2 puffs into the lungs 2 (two) times daily.   valACYclovir 500 MG tablet Commonly known as: VALTREX Take 1 tablet (500 mg total) by mouth daily.   VITAMIN D PO Take 1 tablet by mouth daily.       All past  medical history, surgical history, allergies, family history, immunizations andmedications were updated in the EMR today and reviewed under the history and medication portions of their EMR.     ROS: Negative, with the exception of above mentioned in HPI   Objective:  BP 101/69 (BP Location: Left Arm, Patient Position: Sitting, Cuff Size: Normal)    Pulse 71    Temp 98 F (36.7 C) (Temporal)    Resp 16    Ht _0  (1.702 m)    Wt 210 lb (95.3 kg)    SpO2 98%    BMI 32.89 kg/m  Body mass index is 32.89 kg/m. Gen: Afebrile. No acute distress. Nontoxic in appearance, well developed, well nourished.  HENT: AT. [place].  MMM, no oral lesions.  Eyes:Pupils Equal Round Reactive to light, Extraocular movements intact,  Conjunctiva without redness, discharge or icterus. Neck/lymp/endocrine: Supple, no lymphadenopathy CV: RRR no murmur, no edema Chest: CTAB, no wheeze or crackles. Good air movement. Neuro:  Normal gait. PERLA. EOMi. Alert. Oriented x3  Psych: Mildly anxious, otherwise normal affect, dress and demeanor. Normal speech. Normal thought content and judgment.  No exam data present No results found. No results found for this or any previous visit (from the past 24 hour(s)).  Assessment/Plan: Jade [name] is a 58 y.o. female present for OV for  Need for influenza vaccination - Flu Vaccine QUAD 6+ mos PF IM (Fluarix Quad PF)  Chest discomfort/palpitations/dyspnea on exertion/excessive thirst/fatigue -Uncertain etiology of her constellation of symptoms, could be multifactorial.  She does have a history of asthma, currently not treated.  She also endorses increase in anxiety. Discussed options on approach to treatment with her today.  She would like to start a low-dose Lexapro 5 mg daily to address her increased anxiety.  This was prescribed for her. Chest x-ray has been ordered for evaluation of chest discomfort/dyspnea on exertion. Labs ordered to rule out anemia, infection, electrolyte  disturbance, thyroid disorder, vitamin deficiencies, iron deficiency. Could also consider adding a PPI. Consider cardiology referral depending upon x-ray results and if symptoms are not improving with current management. - CBC w/Diff - Comp Met (CMET) - TSH - Vitamin D (25 hydroxy) - B12 - Iron, TIBC and Ferritin Panel - DG Chest 2 View; Future     Reviewed expectations re: course of current medical issues.  Discussed self-management of symptoms.  Outlined signs and symptoms indicating need for more acute intervention.  Patient verbalized understanding and all questions were answered.  Patient received an After-Visit Summary.  Orders Placed This Encounter  Procedures   Flu Vaccine QUAD 6+ mos PF IM (Fluarix Quad PF)    > 25 minutes spent with patient, >50% of time spent face to face    Note is dictated utilizing voice recognition software. Although note has been proof read prior to signing, occasional typographical errors still can be missed. If any questions arise, please do not hesitate to call for verification.   electronically signed by:  Howard Pouch, DO  [place]

## 2019-04-01 LAB — IRON,TIBC AND FERRITIN PANEL
%SAT: 33 % (calc) (ref 16–45)
Ferritin: 353 ng/mL — ABNORMAL HIGH (ref 16–232)
Iron: 94 ug/dL (ref 45–160)
TIBC: 282 mcg/dL (calc) (ref 250–450)

## 2019-04-03 ENCOUNTER — Telehealth: Payer: Self-pay | Admitting: Family Medicine

## 2019-04-03 MED ORDER — PREDNISONE 20 MG PO TABS: 20.0000 mg | ORAL_TABLET | Freq: Every day | ORAL | 0 refills | Status: DC

## 2019-04-03 MED ORDER — FLUTICASONE-SALMETEROL 115-21 MCG/ACT IN AERO: 2.0000 | INHALATION_SPRAY | Freq: Two times a day (BID) | RESPIRATORY_TRACT | 2 refills | Status: DC

## 2019-04-03 MED ORDER — AZITHROMYCIN 250 MG PO TABS: ORAL_TABLET | ORAL | 0 refills | Status: DC

## 2019-04-03 NOTE — Telephone Encounter (Signed)
Pt is taking 2,000 units of Vit D daily now. Please advise what she should increase this to.   Pt was called and given all lab results.

## 2019-04-03 NOTE — Telephone Encounter (Signed)
Increase her current dose of vitamin D to 3000 units daily.  Take with food for better absorption.

## 2019-04-03 NOTE — Telephone Encounter (Signed)
Please inform patient the following information:  Her blood counts are normal, therefore infection is not likely. Her liver, kidneys and electrolytes are all normal. Her vitamin D is just mildly low at 28, this is unlikely the cause of her symptoms but would encourage her to take [phone] units of vitamin D daily to increase her levels into normal range. Her B12 is significantly low at 262.  B12 to this level can cause decrease of energy in feelings of muscle weakness.  I would encourage her to start 1000 mcg of B12 daily.>>  Goal level is above 500.  If unable to bring up her levels normal range, may require B12 injections routinely.  Her iron levels are normal.   Her ferritin levels are mildly elevated.  This level is elevated when there is a inflammatory process occurring within the body.    Her chest x-ray was read as normal.  Although I feel there may be a mild inflammatory process with in the lungs when I reviewed the x-ray.  I believe her shortness of breath may be caused by lung inflammation therefore first would like to try a round of steroids, Z-Pak and restart her Advair daily.  Close follow-up in 2 weeks for reevaluation.  I have called in these medications for her.  Please schedule her.

## 2019-04-03 NOTE — Telephone Encounter (Signed)
Pt was called and VM was left to return call  °

## 2019-04-04 ENCOUNTER — Telehealth: Payer: Self-pay | Admitting: Family Medicine

## 2019-04-04 NOTE — Telephone Encounter (Signed)
Pt was called and VM was left detailed message with information

## 2019-04-04 NOTE — Telephone Encounter (Signed)
Patient's husband called in concerned about his wife having an infection in her lungs. He has been exposed to [place] at work but hasn't been tested himself. He is having slight sinus issues so he is thinking about going to be tested at [facility]. He wants to know if he should go for her sake.  He also mentioned that patient didn't get any results pertaining to her diabetes. Please call.

## 2019-04-04 NOTE — Telephone Encounter (Signed)
Lm on VM to return call.

## 2019-04-04 NOTE — Telephone Encounter (Signed)
Pts husband was called and given information with tests and results from Dr Raoul Pitch and instructions , he verbalized understanding.

## 2019-04-07 ENCOUNTER — Other Ambulatory Visit: Payer: Self-pay

## 2019-04-07 ENCOUNTER — Ambulatory Visit (INDEPENDENT_AMBULATORY_CARE_PROVIDER_SITE_OTHER): Payer: BC Managed Care – PPO | Admitting: Family Medicine

## 2019-04-07 ENCOUNTER — Encounter: Payer: Self-pay | Admitting: Family Medicine

## 2019-04-07 VITALS — BP 122/84 | HR 86 | Ht 67.0 in | Wt 210.0 lb

## 2019-04-07 DIAGNOSIS — M5136 Other intervertebral disc degeneration, lumbar region: Secondary | ICD-10-CM | POA: Diagnosis not present

## 2019-04-07 DIAGNOSIS — M999 Biomechanical lesion, unspecified: Secondary | ICD-10-CM

## 2019-04-07 DIAGNOSIS — Z20822 Contact with and (suspected) exposure to covid-19: Secondary | ICD-10-CM

## 2019-04-07 NOTE — Assessment & Plan Note (Signed)
Decision today to treat with OMT was based on Physical Exam  After verbal consent patient was treated with HVLA, ME, FPR techniques in cervical, thoracic, rib lumbar and sacral areas  Patient tolerated the procedure well with improvement in symptoms  Patient given exercises, stretches and lifestyle modifications  See medications in patient instructions if given  Patient will follow up in 4-6 weeks 

## 2019-04-07 NOTE — Patient Instructions (Addendum)
Gabapentin Over the counter meds HEP  See me in 4 weeks

## 2019-04-07 NOTE — Assessment & Plan Note (Addendum)
Degenerative disc disease of the lumbar.  Discussed which activities to do likely facet arthropathy. Discussed posture and ergonomics, which activities to do which was to avoid patient could be willing to do possible epidurals if necessary.  Follow-up again in 4 to 6 weeks

## 2019-04-07 NOTE — Progress Notes (Signed)
[name] [address] [place], [place] [address] Phone: [phone] Subjective:   Jade Jade [name], am serving as a scribe for Dr. Hulan [name]. This visit occurred during the SARS-CoV-2 public health emergency.  Safety protocols were in place, including screening questions prior to the visit, additional usage of staff PPE, and extensive cleaning of exam room while observing appropriate contact time as indicated for disinfecting solutions.   CC: Low back pain  [id number]    12/24/17: R thumb pain Injected today.  And secondary to some of her job performance.  Discussed icing regimen at home exercises.  Continue with vitamin D supplementation.  Discussed icing regimen and home exercises.  Follow-up again with me in 4 weeks  Neck pain: Poor posture causing more muscle tightness.  We discussed ergonomics, home exercise, responding well to manipulation.  Discussed icing regimen.  Discussed vitamin D supplementation and topical anti-inflammatories.  Work with her trainer to learn home exercises in greater detail.  Follow-up again in 4 to 6 weeks  Jade Jade [name] is a 58 y.o. female coming in with complaint of low back pain. Patient states that she has been having back pain for months. Has been seeing Dr. Maryjean [name] for epidural injection for sacroilitis. Pain came back one week later. Had a second injection one week later that lasted only one week. Pain increases throughout her day. Has been using heat and lies down to alleviate her pain. Pain in right SI joint and can radiate into the right leg on occasion. 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 Is using steroids this week for asthma so back pain is less.  Patient just feels like she is in chronic pain and is unable to ever be    Past Medical History:  Diagnosis Date  . Anxiety   . Asthma    uses inhaler prn  . Chest pain   . Colon polyps   . Diarrhea    with constipation while taking  diet pills (Alli)  . History of rectal bleeding     2 times in past  . HSV-2 (herpes simplex virus 2) infection   . Palpitations    in past/due to Advil pm  . Vitamin D deficiency    Past Surgical History:  Procedure Laterality Date  . ABDOMINAL HYSTERECTOMY  1998   fibroids. partial  . CESAREAN SECTION     1 time  . COLONOSCOPY W/ POLYPECTOMY  2017  . RESECTOSCOPIC MYOMECTOMY     Social History   Socioeconomic History  . Marital status: Married    Spouse name: Not on file  . Number of children: 1  . Years of education: Not on file  . Highest education level: Not on file  Occupational History  . Occupation: Copywriter, advertising  Tobacco Use  . Smoking status: Never Smoker  . Smokeless tobacco: Never Used  Substance and Sexual Activity  . Alcohol use: Yes    Alcohol/week: 1.0 - 2.0 standard drinks    Types: 1 - 2 Glasses of wine per week    Comment: occ  . Drug use: Jade [name]  . Sexual activity: Yes    Partners: Male    Birth control/protection: Surgical    Comment: DECLINED INSURANCE QUESTIONS,DES NEG  Other Topics Concern  . Not on file  Social History Narrative   Married. One child.   College-educated, works as a Copywriter, advertising.   Smoke alarm in the home. Wears her seatbelt.  Feels safe in her relationship.   Social Determinants of Health   Financial Resource Strain:   . Difficulty of Paying Living Expenses: Not on file  Food Insecurity:   . Worried About Charity fundraiser in the Last Year: Not on file  . Ran Out of Food in the Last Year: Not on file  Transportation Needs:   . Lack of Transportation (Medical): Not on file  . Lack of Transportation (Non-Medical): Not on file  Physical Activity:   . Days of Exercise per Week: Not on file  . Minutes of Exercise per Session: Not on file  Stress:   . Feeling of Stress : Not on file  Social Connections:   . Frequency of Communication with Friends and Family: Not on file  . Frequency of Social Gatherings with  Friends and Family: Not on file  . Attends Religious Services: Not on file  . Active Member of Clubs or Organizations: Not on file  . Attends Archivist Meetings: Not on file  . Marital Status: Not on file   Allergies  Allergen Reactions  . Ciprocin-Fluocin-Procin [Fluocinolone Acetonide]     SOB  . Sulfa Antibiotics     hives   Family History  Problem Relation Age of Onset  . Diabetes Father   . Hypertension Father   . Cancer Father        prostate  . Heart disease Father   . Atrial fibrillation Sister   . Atrial fibrillation Brother   . Thyroid disease Sister   . Diabetes Paternal Uncle   . Cancer Paternal Grandfather   . Colon cancer Neg Hx     Current Outpatient Medications (Endocrine & Metabolic):  .  predniSONE (DELTASONE) 20 MG tablet, Take 1 tablet (20 mg total) by mouth daily with breakfast.   Current Outpatient Medications (Respiratory):  .  fluticasone-salmeterol (ADVAIR HFA) 115-21 MCG/ACT inhaler, Inhale 2 puffs into the lungs 2 (two) times daily.    Current Outpatient Medications (Other):  .  azithromycin (ZITHROMAX) 250 MG tablet, 500 mg day 1, then 250 mg Qd .  escitalopram (LEXAPRO) 5 MG tablet, Take 1 tablet (5 mg total) by mouth daily. .  valACYclovir (VALTREX) 500 MG tablet, Take 1 tablet (500 mg total) by mouth daily. Marland Kitchen  VITAMIN D PO, Take 1 tablet by mouth daily.    Past medical history, social, surgical and family history all reviewed in electronic medical record.  Jade [name] pertanent information unless stated regarding to the chief complaint.   Review of Systems:  Jade [name] headache, visual changes, nausea, vomiting, diarrhea, constipation, dizziness, abdominal pain, skin rash, fevers, chills, night sweats, weight loss, swollen lymph nodes, body aches, joint swelling,chest pain, shortness of breath, mood changes.  Positive muscle aches  Objective  Blood pressure 122/84, pulse 86, height 5\' 7"  (1.702 m), weight 210 lb (95.3 kg), SpO2 97 %.      General: Jade [name] apparent distress alert and oriented x3 mood and affect normal, dressed appropriately.  HEENT: Pupils equal, extraocular movements intact  Respiratory: Patient's speak in full sentences and does not appear short of breath  Cardiovascular: Jade [name] lower extremity edema, non tender, Jade [name] erythema  Skin: Warm dry intact with Jade [name] signs of infection or rash on extremities or on axial skeleton.  Abdomen: Soft nontender  Neuro: Cranial nerves II through XII are intact, neurovascularly intact in all extremities with 2+ DTRs and 2+ pulses.  Lymph: Jade [name] lymphadenopathy of posterior or anterior cervical chain or axillae bilaterally.  Gait  normal with good balance and coordination.  MSK:  Non tender with full range of motion and good stability and symmetric strength and tone of shoulders, elbows, wrist, hip, knee and ankles bilaterally.  Back exam does have some mild loss of lordosis.  Some mild tightness with straight leg test but Jade [name] true radicular symptoms.  Worsening pain with extension of the back mostly in the L4-L5 area on the right side with extension.  FABER test tight bilaterally but not significantly severe.  Deep tendon reflexes intact and 5 out of 5 strength in lower extremities  Osteopathic findings C3 extended rotated and side bent right T3 extended rotated and side bent right inhaled third rib T5 extended rotated and side bent left L2 flexed rotated and side bent right Sacrum right on right    Impression and Recommendations:     This case required medical decision making of moderate complexity. The above documentation has been reviewed and is accurate and complete Lyndal Pulley, DO       Note: This dictation was prepared with Dragon dictation along with smaller phrase technology. Any transcriptional errors that result from this process are unintentional.

## 2019-04-09 LAB — NOVEL CORONAVIRUS, NAA: SARS-CoV-2, NAA: NOT DETECTED

## 2019-04-15 ENCOUNTER — Other Ambulatory Visit: Payer: Self-pay | Admitting: Family Medicine

## 2019-04-17 ENCOUNTER — Telehealth: Payer: Self-pay | Admitting: Family Medicine

## 2019-04-17 NOTE — Telephone Encounter (Signed)
I would advise her to ask the health at work that is  administering her vaccine. There are a few different vaccines available and those administering the vaccine should ask for any contraindications/alergies that apply to that particular vaccine.

## 2019-04-17 NOTE — Telephone Encounter (Signed)
Please advise 

## 2019-04-17 NOTE — Telephone Encounter (Signed)
Husband called for wife. Verified on DPR. 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 Employer is offering to get COVID vaccine tomorrow. Husband is wanting to know if it is okay being patient has some allergies.  Please advise. He needs to be called back today as her employer has to have answer before end of business day.

## 2019-04-17 NOTE — Telephone Encounter (Signed)
Pts husband was called and was given information and told to contact the person administering the vaccine or her health at work. Pts husband said they did not know her medical history like Dr Raoul Pitch does and it is our responsibility to give that information. Pts husband was told multiple times we cannot give information on a vaccine we are not giving and the person giving this vaccine to his wife should be giving this information to her and asking the appropriate questions. He stated we should know all the COVID vaccines and be able to tell him whether his wife should be able to get them or not. He was asked if he would like a Freight forwarder to call and speak with him because I can only give him what Dr Raoul Pitch has said and told the patient to do. He said yes. Sent to [place].

## 2019-04-17 NOTE — Telephone Encounter (Signed)
Spoke with patient's husband and advised him that she could share her mychart information with the person giving the vaccine, if she wanted. Also advised that patients with severe allergic reactions such as those that carry an EpiPen, should not get the [place] version of the vaccine.

## 2019-04-27 ENCOUNTER — Other Ambulatory Visit: Payer: Self-pay

## 2019-04-27 ENCOUNTER — Encounter: Payer: Self-pay | Admitting: Family Medicine

## 2019-04-27 ENCOUNTER — Ambulatory Visit: Payer: BC Managed Care – PPO | Admitting: Family Medicine

## 2019-04-27 VITALS — BP 107/68 | HR 82 | Temp 98.1°F | Resp 16 | Ht 67.0 in | Wt 214.5 lb

## 2019-04-27 DIAGNOSIS — R0602 Shortness of breath: Secondary | ICD-10-CM

## 2019-04-27 DIAGNOSIS — J45909 Unspecified asthma, uncomplicated: Secondary | ICD-10-CM

## 2019-04-27 DIAGNOSIS — R7989 Other specified abnormal findings of blood chemistry: Secondary | ICD-10-CM

## 2019-04-27 DIAGNOSIS — R5383 Other fatigue: Secondary | ICD-10-CM | POA: Diagnosis not present

## 2019-04-27 DIAGNOSIS — R0789 Other chest pain: Secondary | ICD-10-CM

## 2019-04-27 LAB — SEDIMENTATION RATE: Sed Rate: 36 mm/hr — ABNORMAL HIGH (ref 0–30)

## 2019-04-27 LAB — FERRITIN: Ferritin: 369.9 ng/mL — ABNORMAL HIGH (ref 10.0–291.0)

## 2019-04-27 LAB — C-REACTIVE PROTEIN: CRP: <1 mg/dL (ref 0.5–20.0)

## 2019-04-27 LAB — BRAIN NATRIURETIC PEPTIDE: Pro B Natriuretic peptide (BNP): 39 pg/mL (ref 0.0–100.0)

## 2019-04-27 MED ORDER — CYANOCOBALAMIN 1000 MCG/ML IJ SOLN
1000.0000 ug | Freq: Once | INTRAMUSCULAR | Status: AC
  Administered 2019-04-27: 1000 ug via INTRAMUSCULAR

## 2019-04-27 NOTE — Progress Notes (Signed)
This visit occurred during the SARS-CoV-2 public health emergency.  Safety protocols were in place, including screening questions prior to the visit, additional usage of staff PPE, and extensive cleaning of exam room while observing appropriate contact time as indicated for disinfecting solutions.    Jade [name] , November 19, 1960, 58 y.o., female MRN: [id number] Patient Care Team    Relationship Specialty Notifications Start End  Ma Hillock, DO PCP - General Family Medicine  05/14/17   Irene Shipper, MD Consulting Physician Gastroenterology  05/14/17   Etta Quill, [place] Referring Physician Optometry  05/14/17   Valora Piccolo, MD Consulting Physician Allergy  05/14/17     Chief Complaint  Patient presents with  . Follow-up    Pt states chest discomfort has not gotten better. She had to stop prednisone due to not being able to sleep. Pt did not start Lexapro     Subjective: Pt presents for an OV to follow-up on her complaints of chest discomfort.  Patient initially seen approximately 3-4 weeks ago for complaints of chest discomfort.  Patient had reported increased symptoms with anxiety or going to bed on a full stomach.  She had denied shortness of breath, except for on one occasion in which she woke up short of breath and was able to talk herself down.  At that visit, patient elected to try antianxiety medicine.  Lexapro 5 mg daily was prescribed to her.  She endorses today she did not start that medication.  Patient had a chest x-ray, which was read as normal.  She has a history of asthma but was not taking medications at that time, therefore started her back on Advair twice daily.  Patient reports she has been taking that twice daily.  She was also prescribed a round of azithromycin and prednisone but was unable to tolerate prednisone past 3 days secondary to insomnia.  Of note, patient had labs collected last visit, with normal CMP, TSH, glucose and CBC,  low vitamin D level, low B12  level, elevated ferritin with normal iron levels.  She had testing for novel coronavirus April 07, 2019 and it was negative.  Today she endorses no benefit from Z-Pak, prednisone and starting the Advair daily.  She states she has not seen any improvement in her chest discomfort complaints.  She now endorses feeling lightheadedness and a squeezing sensation in her chest.  She denies any chest pain, radiation of chest pain to jaw or extremity, diaphoresis, nausea/vomiting, lower extremity edema or syncopal episode.  Separate note she did receive the first COVID-19  Moderna Vaccination and endorsed having dizziness and tachycardia a few minutes after the vaccination that resolved within 15 minutes.   Prior note: With complaints of intermittent sharp chest pains of 3-4 months duration.  Patient reports over the last 2 weeks they have been worsening and have started to occur almost daily.  She reports the pain is central and left chestlocation and  typically only lasts a few seconds.  The pain did wake her one time over the last 2 weeks and on that occasion she felt like her chest was heavy. she had a hard time breathing on that occasion.  She states that last about 5 minutes and was relieved by "talking herself through it. " She did notice that evening she had gone to bed after eating dinner late.  She also notices the discomfort is worse with increased stress.  She denies any radiation of pain to her jaw or extremities.  She endorses fatigue, increased thirst and nocturia.  She endorses shortness of breath with exertion.  She has a personal history of anxiety, asthma in which she used to be prescribed Advair, vitamin D deficiency and palpitations.  She has a family history of heart disease.  Depression screen PHQ 2/9 05/14/2017  Decreased Interest 0  Down, Depressed, Hopeless 0  PHQ - 2 Score 0    Allergies  Allergen Reactions  . Ciprocin-Fluocin-Procin [Fluocinolone Acetonide]     SOB  . Sulfa  Antibiotics     hives   Social History   Social History Narrative   Married. One child.   College-educated, works as a Copywriter, advertising.   Smoke alarm in the home. Wears her seatbelt.   Feels safe in her relationship.   Past Medical History:  Diagnosis Date  . Anxiety   . Asthma    uses inhaler prn  . Chest pain   . Colon polyps   . Diarrhea    with constipation while taking diet pills (Alli)  . History of rectal bleeding     2 times in past  . HSV-2 (herpes simplex virus 2) infection   . Palpitations    in past/due to Advil pm  . Vitamin D deficiency    Past Surgical History:  Procedure Laterality Date  . ABDOMINAL HYSTERECTOMY  1998   fibroids. partial  . CESAREAN SECTION     1 time  . COLONOSCOPY W/ POLYPECTOMY  2017  . RESECTOSCOPIC MYOMECTOMY     Family History  Problem Relation Age of Onset  . Diabetes Father   . Hypertension Father   . Cancer Father        prostate  . Heart disease Father   . Atrial fibrillation Sister   . Atrial fibrillation Brother   . Thyroid disease Sister   . Diabetes Paternal Uncle   . Cancer Paternal Grandfather   . Colon cancer Neg Hx    Allergies as of 04/27/2019      Reactions   Ciprocin-fluocin-procin [fluocinolone Acetonide]    SOB   Sulfa Antibiotics    hives      Medication List       Accurate as of April 27, 2019 11:59 PM. If you have any questions, ask your nurse or doctor.        STOP taking these medications   azithromycin 250 MG tablet Commonly known as: ZITHROMAX Stopped by: Howard Pouch, DO   escitalopram 5 MG tablet Commonly known as: Lexapro Stopped by: Howard Pouch, DO   predniSONE 20 MG tablet Commonly known as: DELTASONE Stopped by: Howard Pouch, DO     TAKE these medications   fluticasone-salmeterol 115-21 MCG/ACT inhaler Commonly known as: ADVAIR HFA Inhale 2 puffs into the lungs 2 (two) times daily.   valACYclovir 500 MG tablet Commonly known as: VALTREX Take 1 tablet (500 mg  total) by mouth daily.   Vitamin D (Cholecalciferol) 25 MCG (1000 UT) Caps Take 3 capsules by mouth. 3,000 units daily   VITAMIN D PO Take 1 tablet by mouth daily.       All past medical history, surgical history, allergies, family history, immunizations andmedications were updated in the EMR today and reviewed under the history and medication portions of their EMR.     ROS: Negative, with the exception of above mentioned in HPI   Objective:  BP 107/68 (BP Location: Left Arm, Patient Position: Sitting, Cuff Size: Normal)   Pulse 82   Temp 98.1 F (  36.7 C) (Temporal)   Resp 16   Ht _0  (1.702 m)   Wt 214 lb 8 oz (97.3 kg)   SpO2 97%   BMI 33.60 kg/m  Body mass index is 33.6 kg/m.  Gen: Afebrile. No acute distress.  Nontoxic in appearance. HENT: AT. [place].MMM.  Eyes:Pupils Equal Round Reactive to light, Extraocular movements intact,  Conjunctiva without redness, discharge or icterus. Neck/lymp/endocrine: Supple, no lymphadenopathy, no thyromegaly CV: RRR no murmur, no edema Chest: CTAB, no wheeze or crackles Neuro:  Normal gait. PERLA. EOMi. Alert. Oriented x3 Psych: Mildly anxious, normal affect, dress and demeanor. Normal speech. Normal thought content and judgment.  No exam data present No results found. No results found for this or any previous visit (from the past 24 hour(s)).  Assessment/Plan: Anastasija Anfinson is a 58 y.o. female present for OV for  Chest discomfort/palpitations/dyspnea on exertion/dizziness -Vital signs stable.  Patient now endorses chest squeezing sensation and dizziness intermittently which is a progression from her prior symptoms.  Therefore will refer to cardiology for further evaluation of coronary artery disease and possible arrhythmia considering her family history of heart disease and A. fib. -Anxiety could be a possible component, however after her last visit she decided not to start the Lexapro 5 mg daily.  - Chest x-ray: WNL-but did have  some notable B-lines, will repeat today since she has finished the Z-Pak and had at least partial prednisone pack.  She is also restarted her Advair.  -Further work-up for shortness of breath with BNP, ESR, CRP, ANA, ACE, alpha antitrypsin -Referred to cardiology and will consider referral to pulmonology after chest x-ray and lab evaluation is resulted.  B12 deficiency/vitamin D insufficiency/elevated ferritin: -Patient reports starting over-the-counter B12 and vitamin D supplement.  Continue.  She would like to start B12 injections.  First B12 injection was provided today. -Ferritin had been mildly elevated/patient understands this can be elevated secondary to any alcohol consumption, liver conditions or inflammatory conditions of the body.  We will further evaluate her constellation of symptoms and elevated ferritin with ESR, CRP, ACE, ANA and repeat ferritin today.  Pending upon those results pulmonology consult may also be ordered.  Her liver enzymes have been normal.  Her iron levels are normal.  CBC was normal. -Follow-up in 4 weeks for second B12 injection by nurse visit. -Provider appointment in 8 weeks, levels will be ordered at this time.     Reviewed expectations re: course of current medical issues.  Discussed self-management of symptoms.  Outlined signs and symptoms indicating need for more acute intervention.  Patient verbalized understanding and all questions were answered.  Patient received an After-Visit Summary.    Orders Placed This Encounter  Procedures  . DG Chest 2 View  . Ferritin  . B Nat Peptide  . C-reactive protein  . Sedimentation rate  . Angiotensin converting enzyme  . Alpha-1-antitrypsin  . ANA, IFA Comprehensive Panel-([facility])  . Ambulatory referral to Cardiology    Note is dictated utilizing voice recognition software. Although note has been proof read prior to signing, occasional typographical errors still can be missed. If any questions arise,  please do not hesitate to call for verification.   electronically signed by:  Howard Pouch, DO  [place]

## 2019-04-27 NOTE — Patient Instructions (Signed)
Cardiology will call you to schedule for consult.  Please have cxr completed this weekend.  We will call you with lab results and image results once they are resulted.   Continue advair for now.   Depending on results we may also consider referring to pulmonology as well.   Hope you have a great holiday.   Please schedule nurse visit for B12 injection in 4 weeks and provider appt end of feb if not already scheduled for follow up on all levels.

## 2019-04-29 ENCOUNTER — Encounter: Payer: Self-pay | Admitting: Family Medicine

## 2019-05-02 ENCOUNTER — Telehealth: Payer: Self-pay | Admitting: Family Medicine

## 2019-05-02 ENCOUNTER — Other Ambulatory Visit: Payer: Self-pay

## 2019-05-02 ENCOUNTER — Ambulatory Visit (INDEPENDENT_AMBULATORY_CARE_PROVIDER_SITE_OTHER): Payer: 59

## 2019-05-02 DIAGNOSIS — R0789 Other chest pain: Secondary | ICD-10-CM

## 2019-05-02 DIAGNOSIS — R0602 Shortness of breath: Secondary | ICD-10-CM

## 2019-05-02 DIAGNOSIS — R768 Other specified abnormal immunological findings in serum: Secondary | ICD-10-CM

## 2019-05-02 LAB — ANA, IFA COMPREHENSIVE PANEL
Anti Nuclear Antibody (ANA): POSITIVE — AB
ENA SM Ab Ser-aCnc: <1 AI
SM/RNP: <1 AI
SSA (Ro) (ENA) Antibody, IgG: <1 AI
SSB (La) (ENA) Antibody, IgG: <1 AI
Scleroderma (Scl-70) (ENA) Antibody, IgG: <1 AI
ds DNA Ab: 20 IU/mL — ABNORMAL HIGH

## 2019-05-02 LAB — ANTI-NUCLEAR AB-TITER (ANA TITER)
ANA TITER: 1:40 {titer} — ABNORMAL HIGH
ANA Titer 1: 1:40 {titer} — ABNORMAL HIGH

## 2019-05-02 LAB — ANGIOTENSIN CONVERTING ENZYME: Angiotensin-Converting Enzyme: 19 U/L (ref 9–67)

## 2019-05-02 LAB — ALPHA-1-ANTITRYPSIN: A-1 Antitrypsin, Ser: 123 mg/dL (ref 83–199)

## 2019-05-02 NOTE — Telephone Encounter (Signed)
I attempted to call patient and it went straight to voicemail. Please call patient and inform her of the results below.  Please also remind her to have her chest x-ray completed.  Her ferritin remains about the same just mildly elevated above normal.  Again this is possibly due to an inflammatory process within the body which is also creating her shortness of breath.   One of her inflammatory markers are also just very mildly elevated.  This is called the ESR/sed rate.  The other inflammatory marker CRP is normal. Her BNP is normal, this rules out fluid overload from heart disease. Other labs completed were alpha-1 antitrypsin, which rules out alpha-1 antitrypsin deficiency affecting the lungs since it was normal.  Sarcoid was also ruled out by normal ACE levels.  Lastly her ANA, which is the autoimmune disease screening we completed was positive.  Along with a positive antibody called double-stranded DNA antibody that can be seen with connective tissue disorders or lupus diagnosis. -I have referred her to pulmonology and rheumatology for further evaluations on the abnormality seen.

## 2019-05-03 ENCOUNTER — Telehealth: Payer: Self-pay

## 2019-05-03 NOTE — Telephone Encounter (Signed)
Patient is requesting antibiotic ointment for a stye R eye lower lid. Patient declined appointment, she is seeing patients all day.

## 2019-05-03 NOTE — Telephone Encounter (Signed)
Pt is refusing appt due to work and would like message sent to PCP

## 2019-05-03 NOTE — Telephone Encounter (Signed)
If patient is desiring treatment for condition/illness, that illness must first be evaluated and evaluating provider will select appropriate treatment.  I have not seen this patient for this condition and she will need an appointment if she would like me to evaluate, treat and prescribe her medications.

## 2019-05-03 NOTE — Telephone Encounter (Signed)
Pt was called before sending my chart message and phone went straight to VM

## 2019-05-04 ENCOUNTER — Encounter: Payer: Self-pay | Admitting: Cardiovascular Disease

## 2019-05-04 ENCOUNTER — Other Ambulatory Visit: Payer: Self-pay

## 2019-05-04 ENCOUNTER — Ambulatory Visit: Payer: BC Managed Care – PPO | Admitting: Cardiovascular Disease

## 2019-05-04 VITALS — BP 116/71 | HR 77 | Ht 67.0 in | Wt 213.2 lb

## 2019-05-04 DIAGNOSIS — R002 Palpitations: Secondary | ICD-10-CM | POA: Diagnosis not present

## 2019-05-04 DIAGNOSIS — R072 Precordial pain: Secondary | ICD-10-CM | POA: Diagnosis not present

## 2019-05-04 DIAGNOSIS — R0602 Shortness of breath: Secondary | ICD-10-CM

## 2019-05-04 DIAGNOSIS — R0789 Other chest pain: Secondary | ICD-10-CM | POA: Diagnosis not present

## 2019-05-04 MED ORDER — METOPROLOL TARTRATE 100 MG PO TABS: ORAL_TABLET | ORAL | 0 refills | Status: DC

## 2019-05-04 NOTE — Progress Notes (Signed)
Cardiology Office Note:   Date:  05/04/2019  NAME:  Jade [name]    MRN: [id number] DOB:  08-16-1960   PCP:  Ma Hillock, DO  Cardiologist:  No primary care provider on file.   Referring MD: Ma Hillock, DO   Chief Complaint  Patient presents with  . Chest Pain   History of Present Illness:   Jade [name] is a 59 y.o. female with a hx of anxiety who is being seen today for the evaluation of chest pain/palpitations/SOB at the request of Kuneff, [place], DO.  She presents for the evaluation of 4 months of intermittent left chest pain.  Associated symptoms include shortness of breath.  Symptoms mainly occur at work.  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  She reports the pain can occur several times per day.  Does not occur as much at home.  Not worsened with exertion and not alleviated by rest.  Symptoms over the past 2 months have shifted towards chest pressure.  Associated symptoms to include shortness of breath and palpitations.  Her chest pain is bothersome to her.  She reports she is concerned she possibly has a heart blockage.  She was seen by her primary care physician and a chest x-ray was unremarkable.  She had extensive rheumatologic panel which is consistent with positive ANA as well as some other nonspecific inflammatory markers such as elevated ESR.  She describes no symptoms of chest pain that is worse with lying down or better with sitting up.  It seems to always occur at work significant stress.  She had had a nuclear medicine stress test in 2014 that was normal.  She also had an echocardiogram that was normal at that time as well.  She does not smoke has no history of hypertension.  She is not diabetic.  Cardiovascular disease risk factors include family history.  EKG is unremarkable.  BNP 39 TSH 0.89 A1c 5.6 T chol 234 HDL 66 TG 120 LDL 144  Past Medical History: Past Medical History:  Diagnosis Date  . Anxiety   . Asthma     uses inhaler prn  . Chest pain   . Colon polyps   . Diarrhea    with constipation while taking diet pills (Alli)  . History of rectal bleeding     2 times in past  . HSV-2 (herpes simplex virus 2) infection   . Palpitations    in past/due to Advil pm  . Vitamin D deficiency     Past Surgical History: Past Surgical History:  Procedure Laterality Date  . ABDOMINAL HYSTERECTOMY  1998   fibroids. partial  . CESAREAN SECTION     1 time  . COLONOSCOPY W/ POLYPECTOMY  2017  . RESECTOSCOPIC MYOMECTOMY      Current Medications: Current Meds  Medication Sig  . fluticasone-salmeterol (ADVAIR HFA) 115-21 MCG/ACT inhaler Inhale 2 puffs into the lungs 2 (two) times daily.  . valACYclovir (VALTREX) 500 MG tablet Take 1 tablet (500 mg total) by mouth daily.  Marland Kitchen VITAMIN D PO Take 1 tablet by mouth daily.  . Vitamin D, Cholecalciferol, 25 MCG (1000 UT) CAPS Take 3 capsules by mouth. 3,000 units daily    Allergies:    Ciprocin-fluocin-procin [fluocinolone acetonide] and Sulfa antibiotics   Social History: Social History   Socioeconomic History  . Marital status: Married    Spouse name: Not on file  . Number of children: 1  . Years of  education: Not on file  . Highest education level: Not on file  Occupational History  . Occupation: Copywriter, advertising  Tobacco Use  . Smoking status: Never Smoker  . Smokeless tobacco: Never Used  Substance and Sexual Activity  . Alcohol use: Yes    Alcohol/week: 1.0 - 2.0 standard drinks    Types: 1 - 2 Glasses of wine per week    Comment: occ  . Drug use: No  . Sexual activity: Yes    Partners: Male    Birth control/protection: Surgical    Comment: DECLINED INSURANCE QUESTIONS,DES NEG  Other Topics Concern  . Not on file  Social History Narrative   Married. One child.   College-educated, works as a Copywriter, advertising.   Smoke alarm in the home. Wears her seatbelt.   Feels safe in her relationship.   Social Determinants of Health    Financial Resource Strain:   . Difficulty of Paying Living Expenses: Not on file  Food Insecurity:   . Worried About Charity fundraiser in the Last Year: Not on file  . Ran Out of Food in the Last Year: Not on file  Transportation Needs:   . Lack of Transportation (Medical): Not on file  . Lack of Transportation (Non-Medical): Not on file  Physical Activity:   . Days of Exercise per Week: Not on file  . Minutes of Exercise per Session: Not on file  Stress:   . Feeling of Stress : Not on file  Social Connections:   . Frequency of Communication with Friends and Family: Not on file  . Frequency of Social Gatherings with Friends and Family: Not on file  . Attends Religious Services: Not on file  . Active Member of Clubs or Organizations: Not on file  . Attends Archivist Meetings: Not on file  . Marital Status: Not on file     Family History: The patient's family history includes Asthma in her mother; Atrial fibrillation in her brother and sister; Cancer in her father and paternal grandfather; Diabetes in her father and paternal uncle; Heart disease in her father; Hypertension in her father; Thyroid disease in her sister. There is no history of Colon cancer.  ROS:   All other ROS reviewed and negative. Pertinent positives noted in the HPI.     EKGs/Labs/Other Studies Reviewed:   The following studies were personally reviewed by me today:  EKG:  EKG is ordered today.  The ekg ordered today demonstrates normal sinus rhythm, heart rate 77, low voltage, no acute ischemic changes, no evidence of prior infarction, and was personally reviewed by me.   Recent Labs: 03/31/2019: ALT 11; BUN 18; Creatinine, Ser 0.63; Hemoglobin 13.5; Platelets 225.0; Potassium 3.8; Sodium 140; TSH 0.89 04/27/2019: Pro B Natriuretic peptide (BNP) 39.0   Recent Lipid Panel    Component Value Date/Time   CHOL 234 (H) 08/30/2018 0838   TRIG 120 08/30/2018 0838   HDL 66 08/30/2018 0838   CHOLHDL  3.5 08/30/2018 0838   VLDL 24.4 05/14/2017 0945   LDLCALC 144 (H) 08/30/2018 0838    Physical Exam:   VS:  BP 116/71   Pulse 77   Ht '5\' 7"'  (1.702 m)   Wt 213 lb 3.2 oz (96.7 kg)   SpO2 97%   BMI 33.39 kg/m    Wt Readings from Last 3 Encounters:  05/04/19 213 lb 3.2 oz (96.7 kg)  04/27/19 214 lb 8 oz (97.3 kg)  04/07/19 210 lb (95.3 kg)  General: Well nourished, well developed, in no acute distress Heart: Atraumatic, normal size  Eyes: PEERLA, EOMI  Neck: Supple, no JVD Endocrine: No thryomegaly Cardiac: Normal S1, S2; RRR; no murmurs, rubs, or gallops Lungs: Clear to auscultation bilaterally, no wheezing, rhonchi or rales  Abd: Soft, nontender, no hepatomegaly  Ext: No edema, pulses 2+ Musculoskeletal: No deformities, BUE and BLE strength normal and equal Skin: Warm and dry, no rashes   Neuro: Alert and oriented to person, place, time, and situation, CNII-XII grossly intact, no focal deficits  Psych: Normal mood and affect   ASSESSMENT:   Cristela Stalder is a 59 y.o. female who presents for the following: 1. Other chest pain   2. SOB (shortness of breath) on exertion   3. Palpitations   4. Precordial pain     PLAN:   1. Other chest pain 2. SOB (shortness of breath) on exertion 3. Palpitations 4. Precordial pain -She presents with atypical chest pain.  Cardiovascular disease risk factors include obesity as well as family history.  Overall I think this is stress related.  I think we will proceed with an echocardiogram to make sure there is no change in her heart function.  She had a normal BNP obtained by her primary care physician.  We will also proceed with a cardiac CTA to exclude obstructive CAD as etiology here.  This will include 100 mg of metoprolol tartrate taken to our before scan.  She will also need to have a BMP 1 week before the scan.  She reports this is what she would like to have done she is concerned that she possibly has a heart blockage.  Given her  atypical symptoms as well as family history I think this is prudent to exclude CAD as etiology here.  Disposition: Return in about 2 months (around 07/02/2019).  Medication Adjustments/Labs and Tests Ordered: Current medicines are reviewed at length with the patient today.  Concerns regarding medicines are outlined above.  Orders Placed This Encounter  Procedures  . CT CORONARY MORPH W/CTA COR W/SCORE W/CA W/CM &/OR WO/CM  . CT CORONARY FRACTIONAL FLOW RESERVE DATA PREP  . CT CORONARY FRACTIONAL FLOW RESERVE FLUID ANALYSIS  . Basic metabolic panel  . EKG 12-Lead  . ECHOCARDIOGRAM COMPLETE   Meds ordered this encounter  Medications  . metoprolol tartrate (LOPRESSOR) 100 MG tablet    Sig: Take 100 mg 2 hours before Coronary CT    Dispense:  1 tablet    Refill:  0    Patient Instructions  Medication Instructions:  Continue same medications   Lab Work: Bmet to be done 1 week before Coronary CT  Lab order enclosed   Testing/Procedures: Schedule Echo  Schedule Coronary CT  Follow instructions below  Follow-Up: At [facility], you and your health needs are our priority.  As part of our continuing mission to provide you with exceptional heart care, we have created designated Provider Care Teams.  These Care Teams include your primary Cardiologist (physician) and Advanced Practice Providers (APPs -  Physician Assistants and Nurse Practitioners) who all work together to provide you with the care you need, when you need it.  Your next appointment:  2 months   The format for your next appointment:  Virtual   Provider:  Dr.O'Neal     Your cardiac CT will be scheduled at one of the below locations:   [facility] [address] [place], [place] [address] [phone]  [place], [place] [address] [phone]  If scheduled at [facility] please arrive at the [facility]  main entrance of [facility] 30-45 minutes prior to test start time. Proceed to the [facility] Radiology Department (first floor) to check-in and test prep.  If scheduled at [facility], please arrive 15 mins early for check-in and test prep.  Please follow these instructions carefully (unless otherwise directed):    On the Night Before the Test: . Be sure to Drink plenty of water. . Do not consume any caffeinated/decaffeinated beverages or chocolate 12 hours prior to your test. . Do not take any antihistamines 12 hours prior to your test.   On the Day of the Test: . Drink plenty of water. Do not drink any water within one hour of the test. . Do not eat any food 4 hours prior to the test. . You may take your regular medications prior to the test.  . Take metoprolol 100 mg. two hours prior to test. . FEMALES- please wear underwire-free bra if available         After the Test: . Drink plenty of water. . After receiving IV contrast, you may experience a mild flushed feeling. This is normal. . On occasion, you may experience a mild rash up to 24 hours after the test. This is not dangerous. If this occurs, you can take Benadryl 25 mg and increase your fluid intake. . If you experience trouble breathing, this can be serious. If it is severe call 911 IMMEDIATELY. If it is mild, please call our office.   Once we have confirmed authorization from your insurance company, we will call you to set up a date and time for your test.   For non-scheduling related questions, please contact the cardiac imaging nurse navigator should you have any questions/concerns: Marchia Bond, RN Navigator Cardiac Imaging [facility] Heart [facility] V[facility] [phone] Office        Signed, Addison Naegeli. Audie Box, [place]  [address], [place], [place] [address] [phone]  05/04/2019 5:10 PM

## 2019-05-04 NOTE — Patient Instructions (Addendum)
Medication Instructions:  Continue same medications   Lab Work: Bmet to be done 1 week before Coronary CT  Lab order enclosed   Testing/Procedures: Schedule Echo  Schedule Coronary CT  Follow instructions below  Follow-Up: At [facility], you and your health needs are our priority.  As part of our continuing mission to provide you with exceptional heart care, we have created designated Provider Care Teams.  These Care Teams include your primary Cardiologist (physician) and Advanced Practice Providers (APPs -  Physician Assistants and Nurse Practitioners) who all work together to provide you with the care you need, when you need it.  Your next appointment:  2 months   The format for your next appointment:  Virtual   Provider:  Dr.O'Neal     Your cardiac CT will be scheduled at one of the below locations:   [facility] [address] [place], [place] [address] [phone] [place] [address] [place], [place] [address] [phone]  If scheduled at [facility], please arrive at the [facility] main entrance of [facility] 30-45 minutes prior to test start time. Proceed to the [facility] Radiology Department (first floor) to check-in and test prep.  If scheduled at [facility], please arrive 15 mins early for check-in and test prep.  Please follow these instructions carefully (unless otherwise directed):    On the Night Before the Test: . Be sure to Drink plenty of water. . Do not consume any caffeinated/decaffeinated beverages or chocolate 12 hours prior to your test. . Do not take any antihistamines 12 hours prior to your test.   On the Day of the Test: . Drink plenty of water. Do not drink any water within one hour of the test. . Do not eat any food 4 hours prior to the test. . You may take your regular medications prior to the test.  . Take  metoprolol 100 mg. two hours prior to test. . FEMALES- please wear underwire-free bra if available         After the Test: . Drink plenty of water. . After receiving IV contrast, you may experience a mild flushed feeling. This is normal. . On occasion, you may experience a mild rash up to 24 hours after the test. This is not dangerous. If this occurs, you can take Benadryl 25 mg and increase your fluid intake. . If you experience trouble breathing, this can be serious. If it is severe call 911 IMMEDIATELY. If it is mild, please call our office.   Once we have confirmed authorization from your insurance company, we will call you to set up a date and time for your test.   For non-scheduling related questions, please contact the cardiac imaging nurse navigator should you have any questions/concerns: Marchia Bond, RN Navigator Cardiac Imaging [facility] [phone] Office

## 2019-05-08 ENCOUNTER — Telehealth: Payer: Self-pay | Admitting: Family Medicine

## 2019-05-08 NOTE — Telephone Encounter (Signed)
Patient is scheduled to see Dr Tamala Julian on Thursday. She was previously seen by Dr Brayton Mars and was given an injection in her back along with recommendations of vitamins that would help with her pain.   She is still in a lot of pain and would like to know if Dr Tamala Julian would be able to give her an injection in her back as well?  She has requested her past records and a copy of the MRI that she had done and will be bringing it with her to her appointment.

## 2019-05-08 NOTE — Telephone Encounter (Signed)
Spoke with patient who would like injection at next visit. Explained that Dr. Tamala Julian does not perform epidurals but he may feel that other in-office treatments that include injections may be helpful. Patient voices understanding and will keep appointment on Thursday.

## 2019-05-11 ENCOUNTER — Ambulatory Visit (INDEPENDENT_AMBULATORY_CARE_PROVIDER_SITE_OTHER): Payer: 59

## 2019-05-11 ENCOUNTER — Other Ambulatory Visit: Payer: Self-pay

## 2019-05-11 ENCOUNTER — Ambulatory Visit: Payer: BC Managed Care – PPO | Admitting: Family Medicine

## 2019-05-11 ENCOUNTER — Encounter: Payer: Self-pay | Admitting: Family Medicine

## 2019-05-11 VITALS — BP 110/60 | HR 90 | Ht 67.0 in | Wt 214.0 lb

## 2019-05-11 DIAGNOSIS — M533 Sacrococcygeal disorders, not elsewhere classified: Secondary | ICD-10-CM | POA: Diagnosis not present

## 2019-05-11 DIAGNOSIS — G8929 Other chronic pain: Secondary | ICD-10-CM

## 2019-05-11 DIAGNOSIS — M461 Sacroiliitis, not elsewhere classified: Secondary | ICD-10-CM | POA: Diagnosis not present

## 2019-05-11 MED ORDER — MELOXICAM 7.5 MG PO TABS: 7.5000 mg | ORAL_TABLET | Freq: Every day | ORAL | 0 refills | Status: DC

## 2019-05-11 NOTE — Assessment & Plan Note (Signed)
Patient given injection today, tolerated the procedure well, discussed icing regimen and home exercise, discussed which activities of doing which wants to avoid.  We will see how patient responds.  If patient does get some relief we will consider the possibility of a PRP injection in the area as well.  Patient did have 100% improvement with previous steroid injections in September and October by another physician.  Other possibility would be surgical intervention for his sacral fusion with her failing all other conservative therapy.

## 2019-05-11 NOTE — Patient Instructions (Signed)
Meloxicam daily for 5 days burst PT [address] See me again in 4 weeks we will consider PRP

## 2019-05-11 NOTE — Progress Notes (Signed)
[facility] [address] [place] Phone: [phone] Subjective:   I Kandace Blitz am serving as a Education administrator for Dr. Hulan Saas.  This visit occurred during the SARS-CoV-2 public health emergency.  Safety protocols were in place, including screening questions prior to the visit, additional usage of staff PPE, and extensive cleaning of exam room while observing appropriate contact time as indicated for disinfecting solutions.      CC: Low back pain  [id number]  Jade [name] is a 59 y.o. female coming in with complaint of back pain. Patient states she is hasn't improved. Hard for her to sleep.  Patient continues to have pain almost on a daily basis.  Patient states that sitting for long amount of time and then getting up can be severe.  Sometimes is unable to even walk she feels.  Patient is concerned because it seems to be worsening.  Has seen multiple providers for this in the past.  Patient brought in an MRI recently.  MRI was independently visualized by me showing very mild protruding disc with mild degenerative disc disease at L4-L5 and L5-S1.  Patient has been treated with sacroiliac injections in September and October that did relieve patient's symptoms 100% for 1 week each but then will come back at the same amount of pain.  Patient is frustrated because she has not made enough improvement at any point.  Patient would even consider surgical intervention if possible     Past Medical History:  Diagnosis Date  . Anxiety   . Asthma    uses inhaler prn  . Chest pain   . Colon polyps   . Diarrhea    with constipation while taking diet pills (Alli)  . History of rectal bleeding     2 times in past  . HSV-2 (herpes simplex virus 2) infection   . Palpitations    in past/due to Advil pm  . Vitamin D deficiency    Past Surgical History:  Procedure Laterality Date  . ABDOMINAL HYSTERECTOMY  1998   fibroids. partial  . CESAREAN  SECTION     1 time  . COLONOSCOPY W/ POLYPECTOMY  2017  . RESECTOSCOPIC MYOMECTOMY     Social History   Socioeconomic History  . Marital status: Married    Spouse name: Not on file  . Number of children: 1  . Years of education: Not on file  . Highest education level: Not on file  Occupational History  . Occupation: Copywriter, advertising  Tobacco Use  . Smoking status: Never Smoker  . Smokeless tobacco: Never Used  Substance and Sexual Activity  . Alcohol use: Yes    Alcohol/week: 1.0 - 2.0 standard drinks    Types: 1 - 2 Glasses of wine per week    Comment: occ  . Drug use: No  . Sexual activity: Yes    Partners: Male    Birth control/protection: Surgical    Comment: DECLINED INSURANCE QUESTIONS,DES NEG  Other Topics Concern  . Not on file  Social History Narrative   Married. One child.   College-educated, works as a Copywriter, advertising.   Smoke alarm in the home. Wears her seatbelt.   Feels safe in her relationship.   Social Determinants of Health   Financial Resource Strain:   . Difficulty of Paying Living Expenses: Not on file  Food Insecurity:   . Worried About Charity fundraiser in the Last Year: Not on file  . Ran Out  of Food in the Last Year: Not on file  Transportation Needs:   . Lack of Transportation (Medical): Not on file  . Lack of Transportation (Non-Medical): Not on file  Physical Activity:   . Days of Exercise per Week: Not on file  . Minutes of Exercise per Session: Not on file  Stress:   . Feeling of Stress : Not on file  Social Connections:   . Frequency of Communication with Friends and Family: Not on file  . Frequency of Social Gatherings with Friends and Family: Not on file  . Attends Religious Services: Not on file  . Active Member of Clubs or Organizations: Not on file  . Attends Archivist Meetings: Not on file  . Marital Status: Not on file   Allergies  Allergen Reactions  . Ciprocin-Fluocin-Procin [Fluocinolone Acetonide]      SOB  . Sulfa Antibiotics     hives   Family History  Problem Relation Age of Onset  . Diabetes Father   . Hypertension Father   . Cancer Father        prostate  . Heart disease Father   . Atrial fibrillation Sister   . Atrial fibrillation Brother   . Asthma Mother   . Thyroid disease Sister   . Diabetes Paternal Uncle   . Cancer Paternal Grandfather   . Colon cancer Neg Hx      Current Outpatient Medications (Cardiovascular):  .  metoprolol tartrate (LOPRESSOR) 100 MG tablet, Take 100 mg 2 hours before Coronary CT  Current Outpatient Medications (Respiratory):  .  fluticasone-salmeterol (ADVAIR HFA) 115-21 MCG/ACT inhaler, Inhale 2 puffs into the lungs 2 (two) times daily.  Current Outpatient Medications (Analgesics):  .  meloxicam (MOBIC) 7.5 MG tablet, Take 1 tablet (7.5 mg total) by mouth daily.   Current Outpatient Medications (Other):  .  valACYclovir (VALTREX) 500 MG tablet, Take 1 tablet (500 mg total) by mouth daily. Marland Kitchen  VITAMIN D PO, Take 1 tablet by mouth daily. .  Vitamin D, Cholecalciferol, 25 MCG (1000 UT) CAPS, Take 3 capsules by mouth. 3,000 units daily    Past medical history, social, surgical and family history all reviewed in electronic medical record.  No pertanent information unless stated regarding to the chief complaint.   Review of Systems:  No headache, visual changes, nausea, vomiting, diarrhea, constipation, dizziness, abdominal pain, skin rash, fevers, chills, night sweats, weight loss, swollen lymph nodes, body aches, joint swelling, chest pain, shortness of breath, mood changes.  Positive muscle aches  Objective  Blood pressure 110/60, pulse 90, height 5\' 7"  (1.702 m), weight 214 lb (97.1 kg), SpO2 98 %.    General: No apparent distress alert and oriented x3 mood and affect normal, dressed appropriately.  HEENT: Pupils equal, extraocular movements intact  Respiratory: Patient's speak in full sentences and does not appear short of breath    Cardiovascular: No lower extremity edema, non tender, no erythema  Skin: Warm dry intact with no signs of infection or rash on extremities or on axial skeleton.  Abdomen: Soft nontender  Neuro: Cranial nerves II through XII are intact, neurovascularly intact in all extremities with 2+ DTRs and 2+ pulses.  Lymph: No lymphadenopathy of posterior or anterior cervical chain or axillae bilaterally.  Gait normal with good balance and coordination.  MSK:  tender with full range of motion and good stability and symmetric strength and tone of shoulders, elbows, wrist, hip, knee and ankles bilaterally.  Back exam shows severe tenderness to palpation  over the sacroiliac joints bilaterally right greater than left.  Severe positive FABER test on the right.  Tightness with straight leg test but no radicular symptoms.  5 out of 5 strength in lower extremities.  Procedure: Real-time Ultrasound Guided Injection of right sacroiliac joint Device: GE Logiq Q7 Ultrasound guided injection is preferred based studies that show increased duration, increased effect, greater accuracy, decreased procedural pain, increased response rate, and decreased cost with ultrasound guided versus blind injection.  Verbal informed consent obtained.  Time-out conducted.  Noted no overlying erythema, induration, or other signs of local infection.  Skin prepped in a sterile fashion.  Local anesthesia: Topical Ethyl chloride.  With sterile technique and under real time ultrasound guidance: With a 21-gauge 3 inch needle patient was injected with 0.5 cc of 0.5% Marcaine and 0.5 cc of Kenalog 40 mg/mL into the right sacroiliac joint Completed without difficulty  Pain immediately resolved suggesting accurate placement of the medication.  Advised to call if fevers/chills, erythema, induration, drainage, or persistent bleeding.  Images permanently stored and available for review in the ultrasound unit.  Impression: Technically successful  ultrasound guided injection.   Impression and Recommendations:     This case required medical decision making of moderate complexity. The above documentation has been reviewed and is accurate and complete Lyndal Pulley, DO       Note: This dictation was prepared with Dragon dictation along with smaller phrase technology. Any transcriptional errors that result from this process are unintentional.

## 2019-05-17 ENCOUNTER — Telehealth: Payer: Self-pay | Admitting: Cardiovascular Disease

## 2019-05-17 NOTE — Telephone Encounter (Signed)
Patient would like to speak with someone in the billing department in regards to her CT Scan. She has not scheduled her test yet, but would like to know how much is expected from her out of pocket before she does schedule the test.

## 2019-05-18 ENCOUNTER — Other Ambulatory Visit (HOSPITAL_COMMUNITY): Payer: 59

## 2019-05-23 ENCOUNTER — Telehealth: Payer: Self-pay | Admitting: Cardiovascular Disease

## 2019-05-23 NOTE — Telephone Encounter (Signed)
Left message for patient to call and schedule follow up appointment with Dr. Audie Box after Cardiac CT scheduled 06/23/19

## 2019-05-25 ENCOUNTER — Other Ambulatory Visit (HOSPITAL_COMMUNITY): Payer: 59

## 2019-06-02 ENCOUNTER — Ambulatory Visit: Payer: 59

## 2019-06-08 ENCOUNTER — Ambulatory Visit: Payer: 59 | Admitting: Family Medicine

## 2019-06-12 NOTE — Telephone Encounter (Signed)
Left message for patient to call and schedule follow up appointment with Dr. Audie Box after cardiac ct on 06/23/19

## 2019-06-13 ENCOUNTER — Telehealth: Payer: Self-pay | Admitting: Cardiovascular Disease

## 2019-06-13 NOTE — Telephone Encounter (Signed)
Patient is calling wanting to inform Dr. Audie Box she is canceling her CT scheduled for 06/23/19 due to not receiving an answer on how much her portion of cost will be since switching insurances. She states someone from our office informed her they would be calling her in regards to the price and she hasn't heard anything back since. I advised patient I can transfer her to billing or she can call [facility] the performing location to receive the answer to this question, but she refused offer. Patient was transferred to [place] in regards to canceling and she stated she will reschedule once this problem is resolved and someone gives her a definite answer.

## 2019-06-13 NOTE — Telephone Encounter (Signed)
We need to sort this out ASAP.  Jade [name], [place]  [address], [place], [place] [address] [phone]  9:19 PM

## 2019-06-13 NOTE — Telephone Encounter (Signed)
Can someone please see if we can assist her in this issue? Will route to billing, as well as Pam to see if we can assist her.  Thank you!

## 2019-06-14 NOTE — Telephone Encounter (Signed)
Cynthia:  Thanks for the follow-up.   [place] T. Audie Box, [place]  [address] [place], [place] [address] [phone]  1:58 PM

## 2019-06-16 ENCOUNTER — Ambulatory Visit: Payer: 59

## 2019-06-16 NOTE — Telephone Encounter (Signed)
Thanks Caren Griffins!  [place] T. Audie Box, [place]  [address] [place], [place] [address] [phone]  2:12 PM

## 2019-06-22 ENCOUNTER — Ambulatory Visit: Payer: 59 | Admitting: Physical Therapy

## 2019-06-23 ENCOUNTER — Ambulatory Visit (HOSPITAL_COMMUNITY): Payer: 59

## 2019-06-29 ENCOUNTER — Ambulatory Visit: Payer: 59 | Attending: Family Medicine | Admitting: Physical Therapy

## 2019-06-29 ENCOUNTER — Encounter: Payer: Self-pay | Admitting: Physical Therapy

## 2019-06-29 ENCOUNTER — Other Ambulatory Visit: Payer: Self-pay

## 2019-06-29 DIAGNOSIS — G8929 Other chronic pain: Secondary | ICD-10-CM | POA: Diagnosis present

## 2019-06-29 DIAGNOSIS — M79604 Pain in right leg: Secondary | ICD-10-CM | POA: Diagnosis present

## 2019-06-29 DIAGNOSIS — M5441 Lumbago with sciatica, right side: Secondary | ICD-10-CM | POA: Diagnosis not present

## 2019-06-29 NOTE — Therapy (Addendum)
[place], [place], [address] Phone: [phone]   Fax:  [phone]  Physical Therapy Evaluation/Discharge  Patient Details  Name: Jade [name] MRN: [id number] Date of Birth: 01/30/1961 Referring Provider (PT): Lyndal Pulley, DO   Encounter Date: 06/29/2019  PT End of Session - 06/29/19 1537    Visit Number  1    Number of Visits  9    Date for PT Re-Evaluation  08/25/19    PT Start Time  4315    PT Stop Time  1620    PT Time Calculation (min)  43 min    Activity Tolerance  Patient tolerated treatment well    Behavior During Therapy  [facility] for tasks assessed/performed       Past Medical History:  Diagnosis Date  . Anxiety   . Asthma    uses inhaler prn  . Chest pain   . Colon polyps   . Diarrhea    with constipation while taking diet pills (Alli)  . History of rectal bleeding     2 times in past  . HSV-2 (herpes simplex virus 2) infection   . Palpitations    in past/due to Advil pm  . Vitamin D deficiency     Past Surgical History:  Procedure Laterality Date  . ABDOMINAL HYSTERECTOMY  1998   fibroids. partial  . CESAREAN SECTION     1 time  . COLONOSCOPY W/ POLYPECTOMY  2017  . RESECTOSCOPIC MYOMECTOMY      There were no vitals filed for this visit.   Subjective Assessment - 06/29/19 1540    Subjective  Typically on lower Rt and starts after sitting or walking a while (about 45 min-1hr) and then very sharp and have to lay down. Pain jumps initially when laying down, subsides after about 30 min heat pad and legs up. Leg is starting to go numb about 2 weeks ago- points to post leg to calf. Not sure that the last injection helped a whole lot.    Currently in Pain?  Yes    Pain Score  5     Pain Location  Buttocks    Pain Orientation  Right    Pain Descriptors / Indicators  Sore    Aggravating Factors   sitting/walking too long    Pain Relieving Factors  supine on heat          [facility] PT Assessment - 06/29/19 0001      Assessment   Medical Diagnosis  chronic SIJ pain    Referring Provider (PT)  Lyndal Pulley, DO    Onset Date/Surgical Date  --   maybe just before COVID   Hand Dominance  Left      Precautions   Precautions  None      Restrictions   Weight Bearing Restrictions  No      Balance Screen   Has the patient fallen in the past 6 months  No      Prior Function   Vocation Requirements  dental hygeinist      Cognition   Overall Cognitive Status  Within Functional Limits for tasks assessed      Sensation   Additional Comments  numbness on/off in post right leg      ROM / Strength   AROM / PROM / Strength  PROM      PROM   Overall PROM Comments  familiar symptoms on Rt in passive Lt LE IR  Palpation   Palpation comment  concordant pain at Rt piriformis- particularly at sacral attachment. concordant pain reported in PA mobs lateral L5      Special Tests    Special Tests  Lumbar    Lumbar Tests  Straight Leg Raise      Straight Leg Raise   Findings  Negative    Side   Right                Objective measurements completed on examination: See above findings.              PT Education - 06/29/19 1712    Education Details  anatomy of condition, POC, hep, exercise form/rationale    Person(s) Educated  Patient    Methods  Explanation;Demonstration;Tactile cues;Verbal cues;Handout    Comprehension  Verbalized understanding;Returned demonstration;Verbal cues required;Tactile cues required;Need further instruction       PT Short Term Goals - 06/29/19 1636      PT SHORT TERM GOAL #1   Title  pt will be able to use stretches/STM/engagement of core to reduce pain as it onsets    Baseline  was only using a heat pack and supine position prior to eval    Time  3    Period  Weeks    Status  New    Target Date  07/21/19        PT Long Term Goals - 06/29/19 1638      PT LONG TERM GOAL #1   Title  pt  will be able to ambulate comfortably for at least an hour    Baseline  severe pain after 45 min    Time  8    Period  Weeks    Status  New    Target Date  08/25/19      PT LONG TERM GOAL #2   Title  pt will verbalize changes made to work postures to improve support    Baseline  began discussing at eval    Time  8    Period  Weeks    Status  New    Target Date  08/25/19      PT LONG TERM GOAL #3   Title  pt will be able to sit in car and work without limitation by pain as she has a long car ride and has to sit for work    Baseline  45 min begins severe pain    Time  8    Period  Weeks    Status  New    Target Date  08/25/19             Plan - 06/29/19 1633    Clinical Impression Statement  Pt presents to PT with complaints of Rt buttock and post Rt LE pain and numbness that becomes severe with episodes. Notable TTP in piriformis and L5 lateral PAs. Did not complain of TTP at SIJ today or pain with mobilization. Provided with stretches, core activation and self STM with tennis ball today. Pt has a very limited schedule and mine is pretty booked so scheduling is difficult but I encouraged her to contact me with any questions. Denied offer for telehealth. Suspect that SI joint pain has resulted in piriformis spasm and sciatic nerve compression. Pt will benefit from skilled PT in order to decrease pain, improve lumbopelvic stability and improve tolerance to functional postures.    Personal Factors and Comorbidities  Time since onset of injury/illness/exacerbation;Comorbidity 1  Comorbidities  h/o abdominal trauma- c-section, hysterectomy    Examination-Activity Limitations  Bend;Sit;Bed Mobility;Squat;Stairs;Stand;Locomotion Level    Examination-Participation Restrictions  Driving;Other    Stability/Clinical Decision Making  Evolving/Moderate complexity    Clinical Decision Making  Moderate    Rehab Potential  Good    PT Frequency  1x / week    PT Duration  8 weeks    PT  Treatment/Interventions  ADLs/Self Care Home Management;Cryotherapy;Electrical Stimulation;Iontophoresis 61m/ml Dexamethasone;Functional mobility training;Stair training;Gait training;Ultrasound;Traction;Moist Heat;Therapeutic activities;Therapeutic exercise;Balance training;Neuromuscular re-education;Patient/family education;Passive range of motion;Spinal Manipulations;Manual techniques;Dry needling;Taping;Joint Manipulations    PT Next Visit Plan  outcome of stretches? change in numbness?    PT Home Exercise Plan  piriformis stretch, AHSS, SKTC, post pelvic tilt    Consulted and Agree with Plan of Care  Patient       Patient will benefit from skilled therapeutic intervention in order to improve the following deficits and impairments:  Difficulty walking, Increased muscle spasms, Decreased activity tolerance, Pain, Improper body mechanics, Impaired sensation, Postural dysfunction  Visit Diagnosis: Chronic right-sided low back pain with right-sided sciatica - Plan: PT plan of care cert/re-cert  Pain in right leg - Plan: PT plan of care cert/re-cert     Problem List Patient Active Problem List   Diagnosis Date Noted  . [place]roiliitis ([place] 05/11/2019  . DDD (degenerative disc disease), lumbar 04/07/2019  . Nonallopathic lesion of lumbosacral region 04/07/2019  . Nonallopathic lesion of sacral region 04/07/2019  . Arthritis of carpometacarpal ([facility] joint of right thumb 12/24/2017  . Poor posture 12/24/2017  . Nonallopathic lesion of cervical region 12/24/2017  . Nonallopathic lesion of thoracic region 12/24/2017  . Nonallopathic lesion of rib cage 12/24/2017  . Obesity (BMI 30-39.9) 05/14/2017  . H/O vitamin D deficiency 01/24/2016  . Menopausal symptoms 12/02/2012  . Asthma   . HSV-2 (herpes simplex virus 2) infection    Jade [name] C. Kristien Salatino PT, DPT 06/29/19 8:46 PM   [facility] [address]Phone:  [phone]  Fax:  [phone] Name: MNabeeha BadertscherMRN: [id number] of Birth: [birth year]PHYSICAL THERAPY DISCHARGE SUMMARY  Visits from Start of Care: 1  Current functional level related to goals / functional outcomes: See above   Remaining deficits: See above   Education / Equipment: Anatomy of condition, POC, HEP, exercise form/ratioanle  Plan: Patient agrees to discharge.  Patient goals were not met. Patient is being discharged due to the patient's request.  ?????    Pt called to request that we cancel her appointments per MD order and will get injections as condition is worsening.  Kiano Terrien C. Morell Mears PT, DPT 07/20/19 5:52 PM

## 2019-07-05 ENCOUNTER — Encounter: Payer: Self-pay | Admitting: Physical Therapy

## 2019-07-06 ENCOUNTER — Institutional Professional Consult (permissible substitution): Payer: 59 | Admitting: Critical Care Medicine

## 2019-07-06 ENCOUNTER — Telehealth: Payer: Self-pay | Admitting: Physical Therapy

## 2019-07-06 NOTE — Telephone Encounter (Signed)
I spoke with the patient today regarding her symptoms. She says that in the last 3 days or so she feels like the numbness is moving down to the anterior aspect of her thigh. I asked her to lay prone- it used to provide relief but she feels a tightening. Suspect disk involvement over phone conversation. She was unable to come to the clinic as she was babysitting. I asked her to call Dr Thompson Caul office as the soonest she can come here is 3/18. Encouraged her to contact me with any further questions.  Kentaro Alewine C. Casimira Sutphin PT, DPT 07/06/19 5:41 PM

## 2019-07-20 ENCOUNTER — Other Ambulatory Visit: Payer: Self-pay

## 2019-07-20 ENCOUNTER — Ambulatory Visit: Payer: 59 | Admitting: Family Medicine

## 2019-07-20 ENCOUNTER — Encounter: Payer: Self-pay | Admitting: Family Medicine

## 2019-07-20 VITALS — BP 100/70 | HR 91 | Ht 67.0 in | Wt 211.0 lb

## 2019-07-20 DIAGNOSIS — G8929 Other chronic pain: Secondary | ICD-10-CM

## 2019-07-20 DIAGNOSIS — M461 Sacroiliitis, not elsewhere classified: Secondary | ICD-10-CM | POA: Diagnosis not present

## 2019-07-20 DIAGNOSIS — M545 Low back pain, unspecified: Secondary | ICD-10-CM

## 2019-07-20 MED ORDER — METHYLPREDNISOLONE ACETATE 80 MG/ML IJ SUSP
80.0000 mg | Freq: Once | INTRAMUSCULAR | Status: AC
  Administered 2019-07-20: 80 mg via INTRAMUSCULAR

## 2019-07-20 MED ORDER — KETOROLAC TROMETHAMINE 60 MG/2ML IM SOLN
60.0000 mg | Freq: Once | INTRAMUSCULAR | Status: AC
  Administered 2019-07-20: 60 mg via INTRAMUSCULAR

## 2019-07-20 NOTE — Progress Notes (Signed)
[facility] [address] [place] Phone: [phone] Subjective:   I Jade [name] am serving as a Education administrator for Dr. Hulan Saas.  This visit occurred during the SARS-CoV-2 public health emergency.  Safety protocols were in place, including screening questions prior to the visit, additional usage of staff PPE, and extensive cleaning of exam room while observing appropriate contact time as indicated for disinfecting solutions.   I'm seeing this patient by the request  of:  Kuneff, Renee A, DO  CC: Low back pain follow-up  [id number]   05/11/2019 Patient given injection today, tolerated the procedure well, discussed icing regimen and home exercise, discussed which activities of doing which wants to avoid.  We will see how patient responds.  If patient does get some relief we will consider the possibility of a PRP injection in the area as well.  Patient did have 100% improvement with previous steroid injections in September and October by another physician.  Other possibility would be surgical intervention for his sacral fusion with her failing all other conservative therapy.  Update 07/20/2019 Jade [name] is a 59 y.o. female coming in with complaint of SI joint pain. Patient states she is not. feeling well. Went to PT a few weeks ago. Sciatic pain in right leg. Pain is worse with standing and sitting. Exercises made the numbness worse as the radiated to her thigh. Laying down also makes the numbness worse.  Patient states that the radiation is new.  Before it was localized.  Patient previous imaging did show the patient had more of a sacroiliitis as well as arthritic changes of the SI joint.  Patient initially did respond somewhat to SI injections and the last one here did give a 50% improvement but now worsening.  Only lasted days.  Patient is more concerned now with some of the radiation of pain.  The medication that we have given her including  the meloxicam and vitamin D has not been quite as helpful affecting daily activities.      Past Medical History:  Diagnosis Date  . Anxiety   . Asthma    uses inhaler prn  . Chest pain   . Colon polyps   . Diarrhea    with constipation while taking diet pills (Alli)  . History of rectal bleeding     2 times in past  . HSV-2 (herpes simplex virus 2) infection   . Palpitations    in past/due to Advil pm  . Vitamin D deficiency    Past Surgical History:  Procedure Laterality Date  . ABDOMINAL HYSTERECTOMY  1998   fibroids. partial  . CESAREAN SECTION     1 time  . COLONOSCOPY W/ POLYPECTOMY  2017  . RESECTOSCOPIC MYOMECTOMY     Social History   Socioeconomic History  . Marital status: Married    Spouse name: Not on file  . Number of children: 1  . Years of education: Not on file  . Highest education level: Not on file  Occupational History  . Occupation: Copywriter, advertising  Tobacco Use  . Smoking status: Never Smoker  . Smokeless tobacco: Never Used  Substance and Sexual Activity  . Alcohol use: Yes    Alcohol/week: 1.0 - 2.0 standard drinks    Types: 1 - 2 Glasses of wine per week    Comment: occ  . Drug use: No  . Sexual activity: Yes    Partners: Male    Birth control/protection: Surgical  Comment: DECLINED INSURANCE QUESTIONS,DES NEG  Other Topics Concern  . Not on file  Social History Narrative   Married. One child.   College-educated, works as a Copywriter, advertising.   Smoke alarm in the home. Wears her seatbelt.   Feels safe in her relationship.   Social Determinants of Health   Financial Resource Strain:   . Difficulty of Paying Living Expenses:   Food Insecurity:   . Worried About Charity fundraiser in the Last Year:   . Arboriculturist in the Last Year:   Transportation Needs:   . Film/video editor (Medical):   Marland Kitchen Lack of Transportation (Non-Medical):   Physical Activity:   . Days of Exercise per Week:   . Minutes of Exercise per  Session:   Stress:   . Feeling of Stress :   Social Connections:   . Frequency of Communication with Friends and Family:   . Frequency of Social Gatherings with Friends and Family:   . Attends Religious Services:   . Active Member of Clubs or Organizations:   . Attends Archivist Meetings:   Marland Kitchen Marital Status:    Allergies  Allergen Reactions  . Ciprocin-Fluocin-Procin [Fluocinolone Acetonide]     SOB  . Sulfa Antibiotics     hives   Family History  Problem Relation Age of Onset  . Diabetes Father   . Hypertension Father   . Cancer Father        prostate  . Heart disease Father   . Atrial fibrillation Sister   . Atrial fibrillation Brother   . Asthma Mother   . Thyroid disease Sister   . Diabetes Paternal Uncle   . Cancer Paternal Grandfather   . Colon cancer Neg Hx      Current Outpatient Medications (Cardiovascular):  .  metoprolol tartrate (LOPRESSOR) 100 MG tablet, Take 100 mg 2 hours before Coronary CT  Current Outpatient Medications (Respiratory):  .  fluticasone-salmeterol (ADVAIR HFA) 115-21 MCG/ACT inhaler, Inhale 2 puffs into the lungs 2 (two) times daily.  Current Outpatient Medications (Analgesics):  .  meloxicam (MOBIC) 7.5 MG tablet, Take 1 tablet (7.5 mg total) by mouth daily.   Current Outpatient Medications (Other):  .  valACYclovir (VALTREX) 500 MG tablet, Take 1 tablet (500 mg total) by mouth daily. Marland Kitchen  VITAMIN D PO, Take 1 tablet by mouth daily. .  Vitamin D, Cholecalciferol, 25 MCG (1000 UT) CAPS, Take 3 capsules by mouth. 3,000 units daily   Reviewed prior external information including notes and imaging from  primary care provider As well as notes that were available from care everywhere and other healthcare systems.  Past medical history, social, surgical and family history all reviewed in electronic medical record.  No pertanent information unless stated regarding to the chief complaint.   Review of Systems:  No headache,  visual changes, nausea, vomiting, diarrhea, constipation, dizziness, abdominal pain, skin rash, fevers, chills, night sweats, weight loss, swollen lymph nodes, body aches, joint swelling, chest pain, shortness of breath, mood changes. POSITIVE muscle aches  Objective  Blood pressure 100/70, pulse 91, height 5\' 7"  (1.702 m), weight 211 lb (95.7 kg), SpO2 96 %.   General: No apparent distress alert and oriented x3 mood and affect normal, dressed appropriately.  HEENT: Pupils equal, extraocular movements intact  Respiratory: Patient's speak in full sentences and does not appear short of breath  Cardiovascular: No lower extremity edema, non tender, no erythema  Neuro: Cranial nerves II through XII are  intact, neurovascularly intact in all extremities with 2+ DTRs and 2+ pulses.  Gait normal with good balance and coordination.  MSK:   Low back exam shows the patient does have some loss of lordosis.  Severely tender to palpation in the paraspinal musculature.  Tightness with the straight leg test on the right side and patient does state some mild radicular symptoms in the L5 and S1 distributions.  No loss of strength though noted.  Deep tendon reflexes are intact.  Neurovascular intact distally.  Worsening pain with extension over the right sacroiliac joint and positive Corky Sox.    Impression and Recommendations:     This case required medical decision making of moderate complexity. The above documentation has been reviewed and is accurate and complete Lyndal Pulley, DO       Note: This dictation was prepared with Dragon dictation along with smaller phrase technology. Any transcriptional errors that result from this process are unintentional.

## 2019-07-20 NOTE — Assessment & Plan Note (Signed)
History of sacroiliitis with some arthritic changes.  Not quite explain intermittent spotting to conservative therapy.  Toradol and Depo-Medrol given today.  Patient also now having more radicular symptoms and we will try an epidural secondary to the history of some mild degenerative disc disease at L4-L5 and L5-S1.  Patient symptoms being more the L5-S1 area we will try the epidural in this area.  If no significant improvement I do believe that patient has failed all other conservative therapy and would need the possibility of surgical intervention.  Continue the meloxicam.  Patient will call with any questions.  Patient will follow up 2 weeks after the epidural to discuss further.

## 2019-07-20 NOTE — Patient Instructions (Addendum)
Good to see you Epidural L5 s1 Send message 2 weeks after if not better we will refer to Dr. Lynann Bologna

## 2019-07-27 ENCOUNTER — Encounter: Payer: 59 | Admitting: Physical Therapy

## 2019-07-28 ENCOUNTER — Ambulatory Visit
Admission: RE | Admit: 2019-07-28 | Discharge: 2019-07-28 | Disposition: A | Discharge status: Home / Self Care | Payer: 59 | Source: Ambulatory Visit | Attending: Family Medicine | Admitting: Family Medicine

## 2019-07-28 ENCOUNTER — Other Ambulatory Visit: Payer: Self-pay

## 2019-07-28 DIAGNOSIS — M545 Low back pain, unspecified: Secondary | ICD-10-CM

## 2019-07-28 DIAGNOSIS — G8929 Other chronic pain: Secondary | ICD-10-CM

## 2019-07-28 MED ORDER — METHYLPREDNISOLONE ACETATE 40 MG/ML INJ SUSP (RADIOLOG
120.0000 mg | Freq: Once | INTRAMUSCULAR | Status: AC
  Administered 2019-07-28: 120 mg via EPIDURAL

## 2019-07-28 MED ORDER — IOPAMIDOL (ISOVUE-M 200) INJECTION 41%
1.0000 mL | Freq: Once | INTRAMUSCULAR | Status: AC
  Administered 2019-07-28: 1 mL via EPIDURAL

## 2019-07-28 NOTE — Discharge Instructions (Signed)

## 2019-08-03 ENCOUNTER — Ambulatory Visit: Payer: 59 | Admitting: Critical Care Medicine

## 2019-08-03 ENCOUNTER — Other Ambulatory Visit: Payer: Self-pay

## 2019-08-03 ENCOUNTER — Encounter: Payer: Self-pay | Admitting: Critical Care Medicine

## 2019-08-03 ENCOUNTER — Ambulatory Visit: Payer: 59 | Admitting: Rheumatology

## 2019-08-03 VITALS — BP 106/76 | HR 82 | Temp 97.8°F | Ht 67.0 in | Wt 210.4 lb

## 2019-08-03 DIAGNOSIS — R0600 Dyspnea, unspecified: Secondary | ICD-10-CM

## 2019-08-03 DIAGNOSIS — R0609 Other forms of dyspnea: Secondary | ICD-10-CM

## 2019-08-03 DIAGNOSIS — J45909 Unspecified asthma, uncomplicated: Secondary | ICD-10-CM | POA: Diagnosis not present

## 2019-08-03 MED ORDER — MAGIC MOUTHWASH W/LIDOCAINE: 5.0000 mL | Freq: Every day | ORAL | 0 refills | Status: DC | PRN

## 2019-08-03 MED ORDER — ADVAIR HFA 45-21 MCG/ACT IN AERO: 2.0000 | INHALATION_SPRAY | Freq: Two times a day (BID) | RESPIRATORY_TRACT | 12 refills | Status: DC

## 2019-08-03 NOTE — Progress Notes (Signed)
Synopsis: Referred in April 2021 for shortness of breath by Jade [name], Jade A, DO.  Subjective:   PATIENT ID: Jade [name] GENDER: female DOB: 01/01/1961, MRN: [id number]  Chief Complaint  Patient presents with   Consult    sob with exertion, chest discomfort.  cxr f/u honeycomb on x-ray.    Jade [name] is [name] 59 year old woman with [name] history of severe asthma who presents for follow-up.  She was diagnosed at the age of 47 when she was hospitalized for 2 weeks, including requiring ICU admission.  She did not require mechanical ventilation.  No subsequent hospitalizations.  At that time she was started on Pulmicort.  For many years she has been on Advair, but she periodically stops due to sore throat which she feels like moves into her chest and causes of chest infection.  She is not sure if this is thrush.  She previously was on Singulair but stopped due to feeling that it was no longer working.  She was referred for evaluation of episodes of shortness of breath. She was told by her PCP that he recent CXRs looked like they had honeycombing changes.  Her episodes of SOB happen when she is walking into meetings in her office, which she says are associated with severe stress.  She gets chest pain at the same time.  She does not have shortness of breath walking around on the office at other times during the day.  She works as [name] Copywriter, advertising.  No shortness of breath walking into work or when she is at home. For the past week her breathing has been better, and she relates this to less stress at work. She has occasional wheezing when she is at work. She endorses an occasional feeling of irritation in her lungs that feels similar to how it feels to breathe in cold air, but this occurs randomly, resolves on its own, and is separate from dyspnea. These symptoms are different than her chronic asthma symptoms which tend to be worse in the spring and summer, at night, and after being around pollen or dogs.  She was previously treated by her PCP with azithromycin and [name] short course of prednisone, which did not improve her symptoms.  She was referred to Dr. Estanislado [name] in Rheumatology and Dr. Davina [name] in Cardiology for the same complaints. She had [name] low titer positive ANA when checked by her PCP. Jade [name] 04/27/2019 note reviewed. She was recommended to have [name] coronary CT scan performed, which she was unable to complete due to cost.  She is [name] never smoker/ vaper.     Past Medical History:  Diagnosis Date   Anxiety    Asthma    uses inhaler prn   Chest pain    Colon polyps    Diarrhea    with constipation while taking diet pills (Alli)   History of rectal bleeding     2 times in past   HSV-2 (herpes simplex virus 2) infection    Palpitations    in past/due to Advil pm   Vitamin D deficiency      Family History  Problem Relation Age of Onset   Diabetes Father    Hypertension Father    Cancer Father        prostate   Heart disease Father    Atrial fibrillation Sister    Atrial fibrillation Brother    Asthma Mother    Thyroid disease Sister    Diabetes Paternal Uncle    Cancer Paternal  Grandfather    Colon cancer Neg Hx      Past Surgical History:  Procedure Laterality Date   ABDOMINAL HYSTERECTOMY  1998   fibroids. partial   CESAREAN SECTION     1 time   COLONOSCOPY W/ POLYPECTOMY  2017   RESECTOSCOPIC MYOMECTOMY      Social History   Socioeconomic History   Marital status: Married    Spouse name: Not on file   Number of children: 1   Years of education: Not on file   Highest education level: Not on file  Occupational History   Occupation: Copywriter, advertising  Tobacco Use   Smoking status: Never Smoker   Smokeless tobacco: Never Used  Substance and Sexual Activity   Alcohol use: Yes    Alcohol/week: 1.0 - 2.0 standard drinks    Types: 1 - 2 Glasses of wine per week    Comment: occ   Drug use: No   Sexual activity: Yes     Partners: Male    Birth control/protection: Surgical    Comment: DECLINED INSURANCE QUESTIONS,DES NEG  Other Topics Concern   Not on file  Social History Narrative   Married. One child.   College-educated, works as [name] Copywriter, advertising.   Smoke alarm in the home. Wears her seatbelt.   Feels safe in her relationship.   Social Determinants of Health   Financial Resource Strain:    Difficulty of Paying Living Expenses:   Food Insecurity:    Worried About Charity fundraiser in the Last Year:    Arboriculturist in the Last Year:   Transportation Needs:    Film/video editor (Medical):    Lack of Transportation (Non-Medical):   Physical Activity:    Days of Exercise per Week:    Minutes of Exercise per Session:   Stress:    Feeling of Stress :   Social Connections:    Frequency of Communication with Friends and Family:    Frequency of Social Gatherings with Friends and Family:    Attends Religious Services:    Active Member of Clubs or Organizations:    Attends Archivist Meetings:    Marital Status:   Intimate Partner Violence:    Fear of Current or Ex-Partner:    Emotionally Abused:    Physically Abused:    Sexually Abused:      Allergies  Allergen Reactions   Ciprocin-Fluocin-Procin [Fluocinolone Acetonide]     SOB   Sulfa Antibiotics     hives     Immunization History  Administered Date(s) Administered   Influenza,inj,Quad PF,6+ Mos 03/31/2019   Influenza-Unspecified 01/25/2017   Moderna SARS-COVID-2 Vaccination 04/18/2019, 05/12/2019    Outpatient Medications Prior to Visit  Medication Sig Dispense Refill   meloxicam (MOBIC) 7.5 MG tablet Take 1 tablet (7.5 mg total) by mouth daily. 30 tablet 0   metoprolol tartrate (LOPRESSOR) 100 MG tablet Take 100 mg 2 hours before Coronary CT 1 tablet 0   valACYclovir (VALTREX) 500 MG tablet Take 1 tablet (500 mg total) by mouth daily. 90 tablet 4   VITAMIN D PO Take 1 tablet by  mouth daily.     Vitamin D, Cholecalciferol, 25 MCG (1000 UT) CAPS Take 3 capsules by mouth. 3,000 units daily     fluticasone-salmeterol (ADVAIR HFA) 115-21 MCG/ACT inhaler Inhale 2 puffs into the lungs 2 (two) times daily. 1 Inhaler 2   No facility-administered medications prior to visit.    Review of Systems  Constitutional: Negative for chills and fever.  HENT: Negative.   Cardiovascular: Positive for chest pain. Negative for leg swelling.  Gastrointestinal: Negative for abdominal pain, heartburn, nausea and vomiting.  Skin: Negative for rash.     Objective:   Vitals:   08/03/19 1637  BP: 106/76  Pulse: 82  Temp: 97.8 F (36.6 C)  TempSrc: Temporal  SpO2: 95%  Weight: 210 lb 6.4 oz (95.4 kg)  Height: 5\' 7"  (1.702 m)   95% on  RA BMI Readings from Last 3 Encounters:  08/03/19 32.95 kg/m  07/20/19 33.05 kg/m  05/11/19 33.52 kg/m   Wt Readings from Last 3 Encounters:  08/03/19 210 lb 6.4 oz (95.4 kg)  07/20/19 211 lb (95.7 kg)  05/11/19 214 lb (97.1 kg)    Physical Exam Vitals reviewed.  Constitutional:      General: She is not in acute distress.    Appearance: She is obese. She is not ill-appearing.  HENT:     Head: Normocephalic and atraumatic.  Eyes:     General: No scleral icterus. Cardiovascular:     Rate and Rhythm: Normal rate and regular rhythm.     Heart sounds: No murmur.  Pulmonary:     Comments: Breathing comfortably on room air, no conversational dyspnea.  Clear to auscultation bilaterally. Abdominal:     General: There is no distension.     Palpations: Abdomen is soft.     Tenderness: There is no abdominal tenderness.  Musculoskeletal:        General: No swelling or deformity.     Cervical back: Neck supple.  Lymphadenopathy:     Cervical: No cervical adenopathy.  Skin:    General: Skin is warm and dry.     Findings: No rash.  Neurological:     General: No focal deficit present.     Mental Status: She is alert.     Motor: No  weakness.     Coordination: Coordination normal.  Psychiatric:        Behavior: Behavior normal.        Thought Content: Thought content normal.      CBC    Component Value Date/Time   WBC 6.5 03/31/2019 0841   RBC 4.58 03/31/2019 0841   HGB 13.5 03/31/2019 0841   HCT 40.3 03/31/2019 0841   PLT 225.0 03/31/2019 0841   MCV 88.1 03/31/2019 0841   MCH 29.6 08/30/2018 0838   MCHC 33.4 03/31/2019 0841   RDW 13.7 03/31/2019 0841   LYMPHSABS 2.1 03/31/2019 0841   MONOABS 0.5 03/31/2019 0841   EOSABS 0.1 03/31/2019 0841   BASOSABS 0.1 03/31/2019 0841    CHEMISTRY No results for input(s): NA, K, CL, CO2, GLUCOSE, BUN, CREATININE, CALCIUM, MG, PHOS in the last 168 hours. CrCl cannot be calculated (Patient's most recent lab result is older than the maximum 21 days allowed.).   04/27/2019 labs: proBNP 39 Alpha-1 antitrypsin 123; unknown genotype Ferritin 370 CRP < 1 ANA speckled, 1: 40 ACE 19 ds- DNA antibody 20 Anti-smooth muscle antibody negative anti-negative SSA negative Anti-SSB negative Anti-SCL 70 negative Anti-smooth muscle/RNP negative  Chest Imaging- films reviewed: CXR, 2 view 05/02/2019-bilateral hemidiaphragm eventration, tortuous aorta.  CXR, 2 view 03/31/2019-bilateral hemidiaphragm eventration, mild airway thickening but no opacities or masses.  Pulmonary Functions Testing Results: No flowsheet data found.   NM SPECT 02/2013: Normal    Assessment & Plan:     ICD-10-CM   1. DOE (dyspnea on exertion)  R06.00 Pulmonary function test  2.  Severe asthma without complication, unspecified whether persistent  J45.909     DOE at work only periodically and associated with stress. Unlikely to be ILD. Asthma can be exacerbated by significant emotion, which could be worse if her asthma is uncontrolled. -PFTs  History of severe asthma -Restart Advair daily.  Decreased ICS dose to help with tolerance.  Reiterated the importance of clearing her mouth after every  use. -Trial of Magic mouthwash 3 days/week to reduce the risk of thrush. -PFTs -Albuterol as needed  RTC in 2 months.  She would rather try Advair before completing PFTs.  She will let us know in [name] month if her symptoms persist.  If she is having any ongoing symptoms, she should have PFTs completed.    Current Outpatient Medications:    fluticasone-salmeterol (ADVAIR HFA) 45-21 MCG/ACT inhaler, Inhale 2 puffs into the lungs 2 (two) times daily., Disp: 1 Inhaler, Rfl: 12   magic mouthwash w/lidocaine SOLN, Take 5 mLs by mouth daily as needed for mouth pain., Disp: 200 mL, Rfl: 0   meloxicam (MOBIC) 7.5 MG tablet, Take 1 tablet (7.5 mg total) by mouth daily., Disp: 30 tablet, Rfl: 0   metoprolol tartrate (LOPRESSOR) 100 MG tablet, Take 100 mg 2 hours before Coronary CT, Disp: 1 tablet, Rfl: 0   valACYclovir (VALTREX) 500 MG tablet, Take 1 tablet (500 mg total) by mouth daily., Disp: 90 tablet, Rfl: 4   VITAMIN D PO, Take 1 tablet by mouth daily., Disp: , Rfl:    Vitamin D, Cholecalciferol, 25 MCG (1000 UT) CAPS, Take 3 capsules by mouth. 3,000 units daily, Disp: , Rfl:     Julian Hy, DO [facility] 08/03/2019 6:51 PM

## 2019-08-03 NOTE — Patient Instructions (Addendum)
Thank you for visiting Dr. Carlis Abbott at [facility]. We recommend the following: Orders Placed This Encounter  Procedures  . Pulmonary function test   Orders Placed This Encounter  Procedures  . Pulmonary function test    Standing Status:   Future    Standing Expiration Date:   08/02/2020    Order Specific Question:   Where should this test be performed?    Answer:   [facility]    Order Specific Question:   Full PFT: includes the following: basic spirometry, spirometry pre & post bronchodilator, diffusion capacity (DLCO), lung volumes    Answer:   Full PFT    Meds ordered this encounter  Medications  . magic mouthwash w/lidocaine SOLN    Sig: Take 5 mLs by mouth daily as needed for mouth pain.    Dispense:  200 mL    Refill:  0    Use Monday, Wednesday, Friday morning after Advair.  . fluticasone-salmeterol (ADVAIR HFA) 45-21 MCG/ACT inhaler    Sig: Inhale 2 puffs into the lungs 2 (two) times daily.    Dispense:  1 Inhaler    Refill:  12    Return in about 8 weeks (around 09/28/2019).     Please do your part to reduce the spread of COVID-19.

## 2019-08-10 ENCOUNTER — Encounter: Payer: 59 | Admitting: Physical Therapy

## 2019-08-17 ENCOUNTER — Encounter: Payer: 59 | Admitting: Physical Therapy

## 2019-08-24 ENCOUNTER — Encounter: Payer: 59 | Admitting: Physical Therapy

## 2019-08-24 ENCOUNTER — Ambulatory Visit: Payer: 59 | Admitting: Rheumatology

## 2019-09-10 ENCOUNTER — Other Ambulatory Visit: Payer: Self-pay | Admitting: Obstetrics & Gynecology

## 2020-07-19 ENCOUNTER — Other Ambulatory Visit: Payer: Self-pay

## 2020-07-19 ENCOUNTER — Encounter: Payer: Self-pay | Admitting: Family Medicine

## 2020-07-19 ENCOUNTER — Ambulatory Visit: Payer: 59 | Admitting: Family Medicine

## 2020-07-19 VITALS — BP 106/74 | HR 86 | Temp 98.1°F | Ht 67.0 in | Wt 211.0 lb

## 2020-07-19 DIAGNOSIS — R7989 Other specified abnormal findings of blood chemistry: Secondary | ICD-10-CM | POA: Diagnosis not present

## 2020-07-19 DIAGNOSIS — R195 Other fecal abnormalities: Secondary | ICD-10-CM | POA: Diagnosis not present

## 2020-07-19 NOTE — Patient Instructions (Addendum)
Nice to see you today.  We will call you with lab results and guide you further on plan.

## 2020-07-19 NOTE — Progress Notes (Signed)
This visit occurred during the SARS-CoV-2 public health emergency.  Safety protocols were in place, including screening questions prior to the visit, additional usage of staff PPE, and extensive cleaning of exam room while observing appropriate contact time as indicated for disinfecting solutions.    Jade [name] , 02-18-1961, 60 y.o., female MRN: [id number] Patient Care Team    Relationship Specialty Notifications Start End  Ma Hillock, DO PCP - General Family Medicine  05/14/17   Irene Shipper, MD Consulting Physician Gastroenterology  05/14/17   Etta Quill, [place] Referring Physician Optometry  05/14/17   Valora Piccolo, MD Consulting Physician Allergy  05/14/17     Chief Complaint  Patient presents with   Stool Color Change    Pt c/o intermittent stool color change to white x 2 weeks     Subjective: Pt presents for an OV with complaints of stool colored change over the last 2 weeks. She states it occurred the first time 1.5 weeks ago. She reports the only dietary change was she has more sugar and icing the day prior at a baby shower. The stool color returned from pale white to normal over the next few days. Last Friday she had another BM that was pale. She did eat another small piece of cake prior. Otherwise, she has had no dietary changes or medication changes . She has had no associated symptoms to the change in stool color. No pain, fever, rash, nausea, vomit, bpr or night sweats.  She states her mother said she has a fhx of liver "problems". Pt has been taking tylenol PM 1 tab nightly routinely for some time. She drinks one glass of wine 2-3 x a week.   Depression screen [facility] 2/9 07/19/2020 05/14/2017  Decreased Interest 0 0  Down, Depressed, Hopeless 0 0  PHQ - 2 Score 0 0    Allergies  Allergen Reactions   Ciprocin-Fluocin-Procin [Fluocinolone Acetonide]     SOB   Sulfa Antibiotics     hives   Social History   Social History Narrative   Married. One child.    College-educated, works as a Copywriter, advertising.   Smoke alarm in the home. Wears her seatbelt.   Feels safe in her relationship.   Past Medical History:  Diagnosis Date   Anxiety    Asthma    uses inhaler prn   Chest pain    Colon polyps    Diarrhea    with constipation while taking diet pills (Alli)   History of rectal bleeding     2 times in past   HSV-2 (herpes simplex virus 2) infection    Palpitations    in past/due to Advil pm   Vitamin D deficiency    Past Surgical History:  Procedure Laterality Date   ABDOMINAL HYSTERECTOMY  1998   fibroids. partial   CESAREAN SECTION     1 time   COLONOSCOPY W/ POLYPECTOMY  2017   RESECTOSCOPIC MYOMECTOMY     Family History  Problem Relation Age of Onset   Diabetes Father    Hypertension Father    Heart disease Father    Prostate cancer Father    Atrial fibrillation Sister    Atrial fibrillation Brother    Asthma Mother    Thyroid disease Sister    Diabetes Paternal Uncle    Lung cancer Paternal Grandfather    Colon cancer Neg Hx    Allergies as of 07/19/2020      Reactions  Ciprocin-fluocin-procin [fluocinolone Acetonide]    SOB   Sulfa Antibiotics    hives      Medication List       Accurate as of July 19, 2020  2:57 PM. If you have any questions, ask your nurse or doctor.        STOP taking these medications   magic mouthwash w/lidocaine Soln Stopped by: Howard Pouch, DO   meloxicam 7.5 MG tablet Commonly known as: MOBIC Stopped by: Howard Pouch, DO   metoprolol tartrate 100 MG tablet Commonly known as: LOPRESSOR Stopped by: Howard Pouch, DO   VITAMIN D PO Stopped by: Howard Pouch, DO     TAKE these medications   Advair HFA 45-21 MCG/ACT inhaler Generic drug: fluticasone-salmeterol Inhale 2 puffs into the lungs 2 (two) times daily.   valACYclovir 500 MG tablet Commonly known as: VALTREX TAKE 1 TABLET BY MOUTH EVERY DAY   Vitamin D (Cholecalciferol) 25 MCG (1000  UT) Caps Take 3 capsules by mouth. 3,000 units daily       All past medical history, surgical history, allergies, family history, immunizations andmedications were updated in the EMR today and reviewed under the history and medication portions of their EMR.     ROS: Negative, with the exception of above mentioned in HPI   Objective:  BP 106/74    Pulse 86    Temp 98.1 F (36.7 C) (Oral)    Ht 5\' 7"  (1.702 m)    Wt 211 lb (95.7 kg)    SpO2 98%    BMI 33.05 kg/m  Body mass index is 33.05 kg/m. Gen: Afebrile. No acute distress. Nontoxic in appearance, well developed, well nourished.  HENT: AT. [place].  Eyes:Pupils Equal Round Reactive to light, Extraocular movements intact,  Conjunctiva without redness, discharge or icterus. CV: RRR, no edema Chest: CTAB, no wheeze or crackles.   Abd: Soft. flat. NTND. BS present. no Masses palpated. No rebound or guarding. Skin: no rashes, purpura or petechiae.  Neuro:  Normal gait. PERLA. EOMi. Alert. Oriented x3   No exam data present No results found. No results found for this or any previous visit (from the past 24 hour(s)).  Assessment/Plan: Jade [name] is a 60 y.o. female present for OV for  Acholic stool Asymptomatic. Occurred x2 in last 2 weeks. Could be from the icing on the cake she ate (since she ate it twice just prior to symptoms) but that would not be typical.  - CBC with Differential/Platelet - Comprehensive metabolic panel - Lipase - Urinalysis w microscopic + reflex cultur - Iron, TIBC and Ferritin Panel - C-reactive protein - Sedimentation rate Consider GI referral.   Elevated ferritin - Iron, TIBC and Ferritin Panel   Reviewed expectations re: course of current medical issues.  Discussed self-management of symptoms.  Outlined signs and symptoms indicating need for more acute intervention.  Patient verbalized understanding and all questions were answered.  Patient received an After-Visit Summary.    Orders  Placed This Encounter  Procedures   CBC with Differential/Platelet   Comprehensive metabolic panel   Lipase   Urinalysis w microscopic + reflex cultur   Iron, TIBC and Ferritin Panel   C-reactive protein   Sedimentation rate   No orders of the defined types were placed in this encounter.  Referral Orders  No referral(s) requested today     Note is dictated utilizing voice recognition software. Although note has been proof read prior to signing, occasional typographical errors still can be missed. If any  questions arise, please do not hesitate to call for verification.   electronically signed by:  Howard Pouch, DO  [place]

## 2020-07-20 LAB — LIPASE: Lipase: 20 U/L (ref 7–60)

## 2020-07-20 LAB — CBC WITH DIFFERENTIAL/PLATELET
Absolute Monocytes: 696 cells/uL (ref 200–950)
Basophils Absolute: 70 cells/uL (ref 0–200)
Basophils Relative: 0.8 %
Eosinophils Absolute: 235 cells/uL (ref 15–500)
Eosinophils Relative: 2.7 %
HCT: 39.4 % (ref 35.0–45.0)
Hemoglobin: 13.6 g/dL (ref 11.7–15.5)
Lymphs Abs: 2923 cells/uL (ref 850–3900)
MCH: 29.3 pg (ref 27.0–33.0)
MCHC: 34.5 g/dL (ref 32.0–36.0)
MCV: 84.9 fL (ref 80.0–100.0)
MPV: 13.2 fL — ABNORMAL HIGH (ref 7.5–12.5)
Monocytes Relative: 8 %
Neutro Abs: 4776 cells/uL (ref 1500–7800)
Neutrophils Relative %: 54.9 %
Platelets: 244 10*3/uL (ref 140–400)
RBC: 4.64 10*6/uL (ref 3.80–5.10)
RDW: 12.8 % (ref 11.0–15.0)
Total Lymphocyte: 33.6 %
WBC: 8.7 10*3/uL (ref 3.8–10.8)

## 2020-07-20 LAB — COMPREHENSIVE METABOLIC PANEL
AG Ratio: 1.5 (calc) (ref 1.0–2.5)
ALT: 12 U/L (ref 6–29)
AST: 15 U/L (ref 10–35)
Albumin: 4.3 g/dL (ref 3.6–5.1)
Alkaline phosphatase (APISO): 64 U/L (ref 37–153)
BUN: 14 mg/dL (ref 7–25)
CO2: 24 mmol/L (ref 20–32)
Calcium: 9.4 mg/dL (ref 8.6–10.4)
Chloride: 105 mmol/L (ref 98–110)
Creat: 0.64 mg/dL (ref 0.50–1.05)
Globulin: 2.9 g/dL (calc) (ref 1.9–3.7)
Glucose, Bld: 90 mg/dL (ref 65–99)
Potassium: 3.9 mmol/L (ref 3.5–5.3)
Sodium: 141 mmol/L (ref 135–146)
Total Bilirubin: 0.5 mg/dL (ref 0.2–1.2)
Total Protein: 7.2 g/dL (ref 6.1–8.1)

## 2020-07-20 LAB — IRON,TIBC AND FERRITIN PANEL
%SAT: 14 % (calc) — ABNORMAL LOW (ref 16–45)
Ferritin: 316 ng/mL — ABNORMAL HIGH (ref 16–232)
Iron: 41 ug/dL — ABNORMAL LOW (ref 45–160)
TIBC: 290 mcg/dL (calc) (ref 250–450)

## 2020-07-20 LAB — URINALYSIS W MICROSCOPIC + REFLEX CULTURE
Bacteria, UA: NONE SEEN /HPF
Bilirubin Urine: NEGATIVE
Glucose, UA: NEGATIVE
Hgb urine dipstick: NEGATIVE
Hyaline Cast: NONE SEEN /LPF
Ketones, ur: NEGATIVE
Leukocyte Esterase: NEGATIVE
Nitrites, Initial: NEGATIVE
Protein, ur: NEGATIVE
RBC / HPF: NONE SEEN /HPF (ref 0–2)
Specific Gravity, Urine: 1.024 (ref 1.001–1.03)
pH: <=5 (ref 5.0–8.0)

## 2020-07-20 LAB — NO CULTURE INDICATED

## 2020-07-20 LAB — SEDIMENTATION RATE: Sed Rate: 34 mm/h — ABNORMAL HIGH (ref 0–30)

## 2020-07-20 LAB — C-REACTIVE PROTEIN: CRP: 15.5 mg/L — ABNORMAL HIGH (ref <8.0)

## 2020-07-22 ENCOUNTER — Telehealth: Payer: Self-pay | Admitting: Family Medicine

## 2020-07-22 DIAGNOSIS — R195 Other fecal abnormalities: Secondary | ICD-10-CM

## 2020-07-22 DIAGNOSIS — R7982 Elevated C-reactive protein (CRP): Secondary | ICD-10-CM

## 2020-07-22 DIAGNOSIS — R7989 Other specified abnormal findings of blood chemistry: Secondary | ICD-10-CM

## 2020-07-22 DIAGNOSIS — R700 Elevated erythrocyte sedimentation rate: Secondary | ICD-10-CM

## 2020-07-22 NOTE — Telephone Encounter (Signed)
Please inform patient the following information: Her liver enzymes and blood cell counts are normal. Pancreatic enzyme is normal Electrolytes and kidney functions are normal. Urinalysis was normal Her iron levels are mildly low, however her ferritin is higher than normal at 316. Both her CRP and sed rate, which are inflammatory markers are elevated.  Given the appearance of an inflammatory condition by labs and her changes in stool, next step is to get her back to her gastroenterologist for further evaluation and recommendations.  I have placed the referral back to her gastroenterology team-Dr. Henrene Pastor.   Please provide her with the contact information so that she can call them ASAP.   Thanks

## 2020-07-22 NOTE — Telephone Encounter (Signed)
LM for pt to return call to discuss.  

## 2020-07-22 NOTE — Telephone Encounter (Signed)
Patient called for results from labs done 07/19/20. Please call patient to advise.

## 2020-07-22 NOTE — Telephone Encounter (Signed)
LVM for pt to CB regarding results. (see other encounter)

## 2020-07-23 NOTE — Telephone Encounter (Signed)
Patient is calling in stating she never missed a call, verified phone number was correct but is hoping someone will call her back.

## 2020-07-23 NOTE — Telephone Encounter (Signed)
Spoke with pt regarding labs and instructions.   

## 2020-07-23 NOTE — Telephone Encounter (Signed)
LVM for pt to CB regarding results.  

## 2020-07-28 ENCOUNTER — Telehealth: Payer: Self-pay | Admitting: Gastroenterology

## 2020-07-28 NOTE — Telephone Encounter (Signed)
Pt saw Dr. Raoul Pitch for intermittent clay colored stool on 3/25 and referred to Dr. Henrene Pastor with appt on 5/6. Had mild nausea today and again noted clay colored stool. CBC, CMP on 3/25 were unremarkable. Advised GI office evaluation and consider a sooner appt with GI if available.

## 2020-07-30 ENCOUNTER — Other Ambulatory Visit: Payer: Self-pay

## 2020-07-30 ENCOUNTER — Ambulatory Visit: Payer: 59 | Admitting: Internal Medicine

## 2020-07-30 ENCOUNTER — Other Ambulatory Visit: Payer: Self-pay | Admitting: Family Medicine

## 2020-07-30 ENCOUNTER — Other Ambulatory Visit: Payer: Self-pay | Admitting: Obstetrics & Gynecology

## 2020-07-30 ENCOUNTER — Other Ambulatory Visit: Payer: Self-pay | Admitting: Internal Medicine

## 2020-07-30 ENCOUNTER — Encounter: Payer: Self-pay | Admitting: Internal Medicine

## 2020-07-30 VITALS — BP 110/68 | HR 76 | Ht 67.0 in | Wt 209.6 lb

## 2020-07-30 DIAGNOSIS — R194 Change in bowel habit: Secondary | ICD-10-CM | POA: Diagnosis not present

## 2020-07-30 DIAGNOSIS — Z1231 Encounter for screening mammogram for malignant neoplasm of breast: Secondary | ICD-10-CM

## 2020-07-30 DIAGNOSIS — Z86010 Personal history of colonic polyps: Secondary | ICD-10-CM

## 2020-07-30 DIAGNOSIS — R109 Unspecified abdominal pain: Secondary | ICD-10-CM

## 2020-07-30 MED ORDER — SUTAB 1479-225-188 MG PO TABS: 1.0000 | ORAL_TABLET | Freq: Once | ORAL | 0 refills | Status: AC

## 2020-07-30 NOTE — Progress Notes (Signed)
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  Patient reports that about 3 weeks ago she noticed that her bowel movements were "white".  This lasted a few days.  Bowel movements sank.  About 2 weeks later she saw her PCP regarding this issue.  She was investigating the potential cause for such a complaint via the Internet.  She became concerned.  Blood work obtained July 19, 2020 revealed normal comprehensive metabolic panel including liver tests and lipase.  Mildly elevated ferritin at 316.  Elevated CRP at 15.5 sedimentation rate at 34.  Normal CBC with differential including hemoglobin 13.6.  Over the weekend she had problems with nausea and diarrhea.  She again noted change in her stool.  She became alarmed.  Contact with the on-call doctor Sunday evening.  Subsequently contacted his office to have her routine appointment moved sooner.  She also reports to me that over the past year she has had 5 episodes of postprandial urgency with some abdominal discomfort.  No bleeding.  Her weight has been stable.  She has completed her COVID vaccination series.  The patient did undergo routine screening colonoscopy October 18, 2015.  She was found to have right-sided diverticulosis and a diminutive colon polyp which was removed and found to be a tubular adenoma.  Follow-up in 5 years recommended.  REVIEW OF SYSTEMS:  All non-GI ROS negative unless otherwise stated in the HPI except for hearing problems, irregular heartbeat  Past Medical History:  Diagnosis Date  . Anxiety   . Asthma    uses inhaler prn  . Chest pain   . Colon polyps   . Diarrhea    with constipation while taking diet pills (Alli)  . History of rectal bleeding     2 times in past  . HSV-2 (herpes simplex virus 2) infection   . Palpitations    in  past/due to Advil pm  . Vitamin D deficiency     Past Surgical History:  Procedure Laterality Date  . ABDOMINAL HYSTERECTOMY  1998   fibroids. partial  . CESAREAN SECTION     1 time  . COLONOSCOPY W/ POLYPECTOMY  2017  . RESECTOSCOPIC MYOMECTOMY      Social History Jade [name]  reports that she has never smoked. She has never used smokeless tobacco. She reports previous alcohol use of about 1.0 - 2.0 standard drink of alcohol per week. She reports that she does not use drugs.  family history includes Asthma in her mother; Atrial fibrillation in her brother and sister; Diabetes in her father and paternal uncle; Heart disease in her father; Hypertension in her father; Lung cancer in her paternal grandfather; Prostate cancer in her father; Thyroid disease in her sister.  Allergies  Allergen Reactions  . Ciprocin-Fluocin-Procin [Fluocinolone Acetonide]     SOB  . Sulfa Antibiotics     hives       PHYSICAL EXAMINATION: Vital signs: BP 110/68   Pulse 76   Ht 5\' 7"  (1.702 m)   Wt 209 lb 9.6 oz (95.1 kg)   BMI 32.83 kg/m   Constitutional: generally well-appearing, no acute distress Psychiatric: alert and oriented x3, cooperative Eyes: extraocular movements intact, anicteric, conjunctiva pink Mouth: oral pharynx moist, no lesions Neck: supple no lymphadenopathy Cardiovascular: heart regular rate and rhythm, no murmur Lungs: clear to  auscultation bilaterally Abdomen: soft, nontender, nondistended, no obvious ascites, no peritoneal signs, normal bowel sounds, no organomegaly Rectal: Deferred until colonoscopy Extremities: no clubbing, cyanosis, or lower extremity edema bilaterally Skin: no lesions on visible extremities Neuro: No focal deficits. No asterixis.    ASSESSMENT:  1.  Change in bowel habits as described.  Nonspecific.  No alarm features 2.  History of adenomatous colon polyps.  Due for surveillance 3.  Intermittent problems with postprandial abdominal pain  as described.  Etiology unclear 4.  Health related anxiety   PLAN:  1.  Schedule colonoscopy to evaluate change in bowel habits and provide colorectal neoplasia surveillance.The nature of the procedure, as well as the risks, benefits, and alternatives were carefully and thoroughly reviewed with the patient. Ample time for discussion and questions allowed. The patient understood, was satisfied, and agreed to proceed. 2.  Schedule upper endoscopy to evaluate intermittent problems with postprandial abdominal pain.The nature of the procedure, as well as the risks, benefits, and alternatives were carefully and thoroughly reviewed with the patient. Ample time for discussion and questions allowed. The patient understood, was satisfied, and agreed to proceed. 3.  Schedule abdominal ultrasound to evaluate intermittent postprandial abdominal pain.  Rule out cholelithiasis 4.  Ongoing care with PCP regarding mild elevation of inflammatory markers, should GI work-up be unrevealing A total time of 45 minutes was spent preparing to see the patient, reviewing outside tests, obtaining comprehensive history, performing comprehensive physical examination, counseling patient regarding her above listed issues, ordering advanced radiology imaging, ordering advanced endoscopic procedures, and documenting clinical information in the health record

## 2020-07-30 NOTE — Patient Instructions (Signed)
You will be contacted by [facility] in the next 2 days to arrange an ultrasound.  The number on your caller ID will be [phone], please answer when they call.  If you have not heard from them in 2 days please call [phone] to schedule.    You have been scheduled for an endoscopy and colonoscopy. Please follow the written instructions given to you at your visit today. Please pick up your prep supplies at the pharmacy within the next 1-3 days. If you use inhalers (even only as needed), please bring them with you on the day of your procedure.

## 2020-08-02 ENCOUNTER — Encounter: Payer: 59 | Admitting: Family Medicine

## 2020-08-09 ENCOUNTER — Ambulatory Visit (HOSPITAL_COMMUNITY)
Admission: RE | Admit: 2020-08-09 | Discharge: 2020-08-09 | Disposition: A | Discharge status: Home / Self Care | Payer: 59 | Source: Ambulatory Visit | Attending: Internal Medicine | Admitting: Internal Medicine

## 2020-08-09 ENCOUNTER — Other Ambulatory Visit: Payer: Self-pay

## 2020-08-09 DIAGNOSIS — R194 Change in bowel habit: Secondary | ICD-10-CM | POA: Insufficient documentation

## 2020-08-09 DIAGNOSIS — R109 Unspecified abdominal pain: Secondary | ICD-10-CM

## 2020-08-09 DIAGNOSIS — Z86010 Personal history of colonic polyps: Secondary | ICD-10-CM | POA: Insufficient documentation

## 2020-08-20 ENCOUNTER — Telehealth: Payer: Self-pay | Admitting: Cardiovascular Disease

## 2020-08-20 NOTE — Telephone Encounter (Signed)
Called patient -  Jade [name] to voicemail. Left message for patient . RN wanted to know what type of symptoms  she having now.  per chart her last visit was in Jan 2021 with Dr Audie Box.    Will defer to Dr Audie Box if appt is needed since last visit 04/2019 or test can be reschedule prior to office visit

## 2020-08-20 NOTE — Telephone Encounter (Signed)
I spoke with the patient.  She reports "symptoms are coming back again over the last couple months" - chest pressure, difficulty breathing, intermittent chest pain.  Occurs at night lying down or in the mornings at work.  No difference with exertion or rest.  Passes after an hour or so.  Not daily.  Asked about BP/HR, pt states bp is fine but at times HR can jump up to 170s during the day.  when this happens she gets light headed and "kind of eurphoric".  No syncope.  Discussed hydration.  She only drinks about 40 oz water daily.  Instructed pt to increase fluid intake.  Drink all throughout the day and be liberal with salt to see if this helps symptoms.    She is aware we have forwarded to Dr. Audie Box and his nurse and we will call her back with recommendations.   Last Jan he had ordered echo and cCTA which she has not done due to cost.  There is no upcoming appointment scheduled. Next availability with Dr. Audie Box is July.

## 2020-08-20 NOTE — Telephone Encounter (Signed)
    Pt is calling, she said last year when she saw Dr. Audie Box she was advise to get tests done CT and echo due to financial concern she couldn't get it at the time. She said she is having the same issue and would like to know if she can get the tests now or Dr. Audie Box wants to see her first. She said she will be available for the next hour and if she unable to pick up the phone to leave her a detailed message and she will call back

## 2020-08-20 NOTE — Telephone Encounter (Signed)
Patient is calling back to talk with Ivin Booty

## 2020-08-20 NOTE — Telephone Encounter (Signed)
Left message for patient to call back the set up a appointment. Next available is July 26  Or patient can be set up with extender sooner.  awaiting call back

## 2020-08-20 NOTE — Telephone Encounter (Signed)
Let's get her in for an appointment to re-evaluate.   [place] T. Audie Box, MD, [facility]  [address], [place], [place] [address] [phone]  2:14 PM

## 2020-08-26 NOTE — Telephone Encounter (Signed)
Patient is scheduled for 08/05.

## 2020-08-30 ENCOUNTER — Ambulatory Visit: Payer: 59 | Admitting: Internal Medicine

## 2020-09-03 ENCOUNTER — Telehealth: Payer: Self-pay | Admitting: Internal Medicine

## 2020-09-03 NOTE — Telephone Encounter (Signed)
Inbound call from patient. Requesting prep for procedure to be sent to [facility] on [id number]

## 2020-09-04 MED ORDER — SUTAB 1479-225-188 MG PO TABS: 1.0000 | ORAL_TABLET | Freq: Once | ORAL | 0 refills | Status: AC

## 2020-09-04 NOTE — Telephone Encounter (Signed)
Sent Sutab to pharmacy 

## 2020-09-13 ENCOUNTER — Encounter: Payer: Self-pay | Admitting: Family Medicine

## 2020-09-13 ENCOUNTER — Other Ambulatory Visit: Payer: Self-pay

## 2020-09-13 ENCOUNTER — Ambulatory Visit (INDEPENDENT_AMBULATORY_CARE_PROVIDER_SITE_OTHER): Payer: 59 | Admitting: Family Medicine

## 2020-09-13 VITALS — BP 102/71 | HR 81 | Temp 98.2°F | Ht 66.0 in | Wt 209.0 lb

## 2020-09-13 DIAGNOSIS — E669 Obesity, unspecified: Secondary | ICD-10-CM | POA: Diagnosis not present

## 2020-09-13 DIAGNOSIS — M2550 Pain in unspecified joint: Secondary | ICD-10-CM

## 2020-09-13 DIAGNOSIS — R700 Elevated erythrocyte sedimentation rate: Secondary | ICD-10-CM | POA: Diagnosis not present

## 2020-09-13 DIAGNOSIS — R7982 Elevated C-reactive protein (CRP): Secondary | ICD-10-CM

## 2020-09-13 DIAGNOSIS — Z13220 Encounter for screening for lipoid disorders: Secondary | ICD-10-CM

## 2020-09-13 DIAGNOSIS — Z23 Encounter for immunization: Secondary | ICD-10-CM

## 2020-09-13 DIAGNOSIS — Z0000 Encounter for general adult medical examination without abnormal findings: Secondary | ICD-10-CM | POA: Diagnosis not present

## 2020-09-13 DIAGNOSIS — R7989 Other specified abnormal findings of blood chemistry: Secondary | ICD-10-CM

## 2020-09-13 DIAGNOSIS — Z8639 Personal history of other endocrine, nutritional and metabolic disease: Secondary | ICD-10-CM

## 2020-09-13 DIAGNOSIS — Z131 Encounter for screening for diabetes mellitus: Secondary | ICD-10-CM | POA: Insufficient documentation

## 2020-09-13 NOTE — Progress Notes (Signed)
This visit occurred during the SARS-CoV-2 public health emergency.  Safety protocols were in place, including screening questions prior to the visit, additional usage of staff PPE, and extensive cleaning of exam room while observing appropriate contact time as indicated for disinfecting solutions.    Patient ID: Jade [name], female  DOB: 04-16-1961, 60 y.o.   MRN: [id number] Patient Care Team    Relationship Specialty Notifications Start End  Ma Hillock, DO PCP - General Family Medicine  05/14/17   Irene Shipper, MD Consulting Physician Gastroenterology  05/14/17   Etta Quill, [place] Referring Physician Optometry  05/14/17   Valora Piccolo, MD Consulting Physician Allergy  05/14/17     Chief Complaint  Patient presents with  . Annual Exam    Pt is fasting    Subjective: Jade [name] is a 60 y.o.  Female  present for CPE. All past medical history, surgical history, allergies, family history, immunizations, medications and social history were updated in the electronic medical record today. All recent labs, ED visits and hospitalizations within the last year were reviewed.  Health maintenance:  Colonoscopy: completed 2017, by Dr. Henrene Pastor, resutls colon polyps. follow up 5 year follow-up.Scheduled.  Mammogram: completed: 01/06/2019> scheduled 10/10/2020 Cervical cancer screening: History of hysterectomy not indicated Immunizations: tdap updated today, Influenza (encouraged yearly), covid completed, shingrix #1 today (#2 by nurse visit in 3 mos) Infectious disease screening: HIV and hep c completed  DEXA: N/A Assistive device: none Oxygen [id number]:WFUX Patient has a Dental home. Hospitalizations/ED visits: reviewed   Depression screen [facility] 2/9 09/13/2020 07/19/2020 05/14/2017  Decreased Interest 0 0 0  Down, Depressed, Hopeless 0 0 0  PHQ - 2 Score 0 0 0   GAD 7 : Generalized Anxiety Score 05/14/2017  Nervous, Anxious, on Edge 0  Control/stop worrying 0  Worry too much -  different things 0  Trouble relaxing 0  Restless 0  Easily annoyed or irritable 0  Afraid - awful might happen 0  Total GAD 7 Score 0    Immunization History  Administered Date(s) Administered  . Influenza,inj,Quad PF,6+ Mos 03/31/2019  . Influenza-Unspecified 01/25/2017  . Moderna Sars-Covid-2 Vaccination 04/18/2019, 05/12/2019  . Tdap 09/13/2020  . Zoster Recombinat (Shingrix) 09/13/2020    Past Medical History:  Diagnosis Date  . Anxiety   . Asthma    uses inhaler prn  . Chest pain   . Colon polyps   . Diarrhea    with constipation while taking diet pills (Alli)  . History of rectal bleeding     2 times in past  . HSV-2 (herpes simplex virus 2) infection   . Palpitations    in past/due to Advil pm  . Vitamin D deficiency    Allergies  Allergen Reactions  . Ciprocin-Fluocin-Procin [Fluocinolone Acetonide]     SOB  . Sulfa Antibiotics     hives   Past Surgical History:  Procedure Laterality Date  . ABDOMINAL HYSTERECTOMY  1998   fibroids. partial  . CESAREAN SECTION     1 time  . COLONOSCOPY W/ POLYPECTOMY  2017  . RESECTOSCOPIC MYOMECTOMY     Family History  Problem Relation Age of Onset  . Diabetes Father   . Hypertension Father   . Heart disease Father   . Prostate cancer Father   . Atrial fibrillation Sister   . Atrial fibrillation Brother   . Asthma Mother   . Thyroid disease Sister   . Diabetes Paternal Uncle   . Lung cancer Paternal  Grandfather   . Colon cancer Neg Hx    Social History   Social History Narrative   Married. One child.   College-educated, works as a Copywriter, advertising.   Smoke alarm in the home. Wears her seatbelt.   Feels safe in her relationship.    Allergies as of 09/13/2020      Reactions   Ciprocin-fluocin-procin [fluocinolone Acetonide]    SOB   Sulfa Antibiotics    hives      Medication List       Accurate as of Sep 13, 2020 11:59 PM. If you have any questions, ask your nurse or doctor.        Advair HFA  45-21 MCG/ACT inhaler Generic drug: fluticasone-salmeterol Inhale 2 puffs into the lungs 2 (two) times daily.   Sutab [phone] MG Tabs Generic drug: Sodium Sulfate-Mag Sulfate-KCl Take by mouth.   valACYclovir 500 MG tablet Commonly known as: VALTREX TAKE 1 TABLET BY MOUTH EVERY DAY   Vitamin D (Cholecalciferol) 25 MCG (1000 UT) Caps Take 3 capsules by mouth. 3,000 units daily       All past medical history, surgical history, allergies, family history, immunizations andmedications were updated in the EMR today and reviewed under the history and medication portions of their EMR.      US Abdomen Complete Result Date: 08/09/2020  IMPRESSION: 1.  No acute findings to explain the patient's abdominal pain. 2. Diffusely increased liver parenchymal echogenicity which is nonspecific but most commonly seen with hepatic steatosis. No focal lesion identified. 3.  Small right renal cyst. Electronically Signed   By: Audie Pinto M.D.   On: 08/09/2020 16:10   ROS: 14 pt review of systems performed and negative (unless mentioned in an HPI)  Objective: BP 102/71   Pulse 81   Temp 98.2 F (36.8 C) (Oral)   Ht 5\' 6"  (1.676 m)   Wt 209 lb (94.8 kg)   SpO2 98%   BMI 33.73 kg/m  Gen: Afebrile. No acute distress. Nontoxic in appearance, well-developed, well-nourished,  Pleasant obese female.  HENT: AT. [place]. Bilateral TM visualized and normal in appearance, normal external auditory canal. MMM, no oral lesions, adequate dentition. Bilateral nares within normal limits. Throat without erythema, ulcerations or exudates. no Cough on exam, no hoarseness on exam. Eyes:Pupils Equal Round Reactive to light, Extraocular movements intact,  Conjunctiva without redness, discharge or icterus. Neck/lymp/endocrine: Supple,no lymphadenopathy, no thyromegaly CV: RRR no murmur, no edema, +2/4 P posterior tibialis pulses.  Chest: CTAB, no wheeze, rhonchi or crackles. normal Respiratory effort. good Air  movement. Abd: Soft. flat. NTND. BS present. no Masses palpated. No hepatosplenomegaly. No rebound tenderness or guarding. Skin: No rashes, purpura or petechiae. Warm and well-perfused. Skin intact. Neuro/Msk: Normal gait. PERLA. EOMi. Alert. Oriented x3.  Cranial nerves II through XII intact. Muscle strength 5/5 upper/lower extremity. DTRs equal bilaterally. Psych: Normal affect, dress and demeanor. Normal speech. Normal thought content and judgment.   No exam data present  Assessment/plan: Jade [name] is a 60 y.o. female present for CPE H/O vitamin D deficiency - VITAMIN D 25 Hydroxy (Vit-D Deficiency, Fractures) Elevated C-reactive protein (CRP)/Elevated sed rate - Iron, TIBC and Ferritin Panel - C-reactive protein - Sedimentation rate Arthralgia, unspecified joint - TSH - Iron, TIBC and Ferritin Panel - VITAMIN D 25 Hydroxy (Vit-D Deficiency, Fractures) Diabetes mellitus screening - Hemoglobin A1c Lipid screening/Obesity (BMI 30-39.9) - Lipid panel Elevated ferritin - Iron, TIBC and Ferritin Panel Need for diphtheria-tetanus-pertussis (Tdap) vaccine Tdap provided today Need for  zoster vaccination Shingrix vaccine #1 provided today Routine general medical examination at a health care facility Patient was encouraged to exercise greater than 150 minutes a week. Patient was encouraged to choose a diet filled with fresh fruits and vegetables, and lean meats. AVS provided to patient today for education/recommendation on gender specific health and safety maintenance. Colonoscopy: completed 2017, by Dr. Henrene Pastor, resutls colon polyps. follow up 5 year follow-up.Scheduled.  Mammogram: completed: 01/06/2019> scheduled 10/10/2020 Cervical cancer screening: History of hysterectomy not indicated Immunizations: tdap updated today, Influenza (encouraged yearly), covid completed, shingrix #1 today (#2 by nurse visit in 3 mos) Infectious disease screening: HIV and hep c completed  DEXA:  routine screen  Return in about 1 year (around 09/13/2021) for CPE (30 min).  Orders Placed This Encounter  Procedures  . Varicella-zoster vaccine IM  . Tdap vaccine greater than or equal to 7yo IM  . Lipid panel  . Hemoglobin A1c  . TSH  . Iron, TIBC and Ferritin Panel  . VITAMIN D 25 Hydroxy (Vit-D Deficiency, Fractures)  . C-reactive protein  . Sedimentation rate    Orders Placed This Encounter  Procedures  . Varicella-zoster vaccine IM  . Tdap vaccine greater than or equal to 7yo IM  . Lipid panel  . Hemoglobin A1c  . TSH  . Iron, TIBC and Ferritin Panel  . VITAMIN D 25 Hydroxy (Vit-D Deficiency, Fractures)  . C-reactive protein  . Sedimentation rate   No orders of the defined types were placed in this encounter.  Referral Orders  No referral(s) requested today     Electronically signed by: Howard Pouch, [place]

## 2020-09-13 NOTE — Patient Instructions (Addendum)
Health Maintenance, Female Adopting a healthy lifestyle and getting preventive care are important in promoting health and wellness. Ask your health care provider about:  The right schedule for you to have regular tests and exams.  Things you can do on your own to prevent diseases and keep yourself healthy. What should I know about diet, weight, and exercise? Eat a healthy diet  Eat a diet that includes plenty of vegetables, fruits, low-fat dairy products, and lean protein.  Do not eat a lot of foods that are high in solid fats, added sugars, or sodium.   Maintain a healthy weight Body mass index (BMI) is used to identify weight problems. It estimates body fat based on height and weight. Your health care provider can help determine your BMI and help you achieve or maintain a healthy weight. Get regular exercise Get regular exercise. This is one of the most important things you can do for your health. Most adults should:  Exercise for at least 150 minutes each week. The exercise should increase your heart rate and make you sweat (moderate-intensity exercise).  Do strengthening exercises at least twice a week. This is in addition to the moderate-intensity exercise.  Spend less time sitting. Even light physical activity can be beneficial. Watch cholesterol and blood lipids Have your blood tested for lipids and cholesterol at 58 years of age, then have this test every 5 years. Have your cholesterol levels checked more often if:  Your lipid or cholesterol levels are high.  You are older than 58 years of age.  You are at high risk for heart disease. What should I know about cancer screening? Depending on your health history and family history, you may need to have cancer screening at various ages. This may include screening for:  Breast cancer.  Cervical cancer.  Colorectal cancer.  Skin cancer.  Lung cancer. What should I know about heart disease, diabetes, and high blood  pressure? Blood pressure and heart disease  High blood pressure causes heart disease and increases the risk of stroke. This is more likely to develop in people who have high blood pressure readings, are of African descent, or are overweight.  Have your blood pressure checked: ? Every 3-5 years if you are 38-35 years of age. ? Every year if you are 19 years old or older. Diabetes Have regular diabetes screenings. This checks your fasting blood sugar level. Have the screening done:  Once every three years after age 64 if you are at a normal weight and have a low risk for diabetes.  More often and at a younger age if you are overweight or have a high risk for diabetes. What should I know about preventing infection? Hepatitis B If you have a higher risk for hepatitis B, you should be screened for this virus. Talk with your health care provider to find out if you are at risk for hepatitis B infection. Hepatitis C Testing is recommended for:  Everyone born from [birth year] through 1963.  Anyone with known risk factors for hepatitis C. Sexually transmitted infections (STIs)  Get screened for STIs, including gonorrhea and chlamydia, if: ? You are sexually active and are younger than 58 years of age. ? You are older than 58 years of age and your health care provider tells you that you are at risk for this type of infection. ? Your sexual activity has changed since you were last screened, and you are at increased risk for chlamydia or gonorrhea. Ask your health care  provider if you are at risk.  Ask your health care provider about whether you are at high risk for HIV. Your health care provider may recommend a prescription medicine to help prevent HIV infection. If you choose to take medicine to prevent HIV, you should first get tested for HIV. You should then be tested every 3 months for as long as you are taking the medicine. Pregnancy  If you are about to stop having your period (premenopausal) and  you may become pregnant, seek counseling before you get pregnant.  Take 400 to 800 micrograms (mcg) of folic acid every day if you become pregnant.  Ask for birth control (contraception) if you want to prevent pregnancy. Osteoporosis and menopause Osteoporosis is a disease in which the bones lose minerals and strength with aging. This can result in bone fractures. If you are 40 years old or older, or if you are at risk for osteoporosis and fractures, ask your health care provider if you should:  Be screened for bone loss.  Take a calcium or vitamin D supplement to lower your risk of fractures.  Be given hormone replacement therapy (HRT) to treat symptoms of menopause. Follow these instructions at home: Lifestyle  Do not use any products that contain nicotine or tobacco, such as cigarettes, e-cigarettes, and chewing tobacco. If you need help quitting, ask your health care provider.  Do not use street drugs.  Do not share needles.  Ask your health care provider for help if you need support or information about quitting drugs. Alcohol use  Do not drink alcohol if: ? Your health care provider tells you not to drink. ? You are pregnant, may be pregnant, or are planning to become pregnant.  If you drink alcohol: ? Limit how much you use to 0-1 drink a day. ? Limit intake if you are breastfeeding.  Be aware of how much alcohol is in your drink. In the [place] one drink equals one 12 oz bottle of beer (355 mL), one 5 oz glass of wine (148 mL), or one 1 oz glass of hard liquor (44 mL). General instructions  Schedule regular health, dental, and eye exams.  Stay current with your vaccines.  Tell your health care provider if: ? You often feel depressed. ? You have ever been abused or do not feel safe at home. Summary  Adopting a healthy lifestyle and getting preventive care are important in promoting health and wellness.  Follow your health care provider's instructions about healthy  diet, exercising, and getting tested or screened for diseases.  Follow your health care provider's instructions on monitoring your cholesterol and blood pressure. This information is not intended to replace advice given to you by your health care provider. Make sure you discuss any questions you have with your health care provider. Document Revised: 04/06/2018 Document Reviewed: 04/06/2018 Elsevier Patient Education  2021 [place]. Nonalcoholic Fatty Liver Disease Diet, Adult Nonalcoholic fatty liver disease is a condition that causes fat to build up in and around the liver. The disease makes it harder for the liver to work the way that it should. Following a healthy diet can help to keep nonalcoholic fatty liver disease under control. It can also help to prevent or improve conditions that are associated with the disease, such as heart disease, diabetes, high blood pressure, and abnormal cholesterol levels. Along with regular exercise, this diet:  Promotes weight loss.  Helps to control blood sugar levels.  Helps to improve the way that the body uses  insulin. What are tips for following this plan? Reading food labels Always check food labels for:  The amount of saturated fat in a food. You should limit your intake of saturated fat. Saturated fat is found in foods that come from animals, including meat and dairy products such as butter, cheese, and whole milk.  The amount of fiber in a food. You should choose high-fiber foods such as fruits, vegetables, and whole grains. Try to get 25-30 grams (g) of fiber a day.   Cooking  When cooking, use heart-healthy oils that are high in monounsaturated fats. These include olive oil, canola oil, and avocado oil.  Limit frying or deep-frying foods. Cook foods using healthy methods such as baking, boiling, steaming, and grilling instead. Meal planning  You may want to keep track of how many calories you take in. Eating the right amount of  calories will help you achieve a healthy weight. Meeting with a registered dietitian can help you get started.  Limit how often you eat takeout and fast food. These foods are usually very high in fat, salt, and sugar.  Use the glycemic index (GI) to plan your meals. The index tells you how quickly a food will raise your blood sugar. Choose low-GI foods (GI less than 55). These foods take a longer time to raise blood sugar. A registered dietitian can help you identify foods lower on the GI scale. Lifestyle  You may want to follow a Mediterranean diet. This diet includes a lot of vegetables, lean meats or fish, whole grains, fruits, and healthy oils and fats. What foods can I eat? Fruits Bananas. Apples. Oranges. Grapes. Papaya. Mango. Pomegranate. Kiwi. Grapefruit. Cherries. Vegetables Lettuce. Spinach. Peas. Beets. Cauliflower. Cabbage. Broccoli. Carrots. Tomatoes. Squash. Eggplant. Herbs. Peppers. Onions. Cucumbers. Brussels sprouts. Yams and sweet potatoes. Beans. Lentils. Grains Whole wheat or whole-grain foods, including breads, crackers, cereals, and pasta. Stone-ground whole wheat. Unsweetened oatmeal. Bulgur. Barley. Quinoa. Brown or wild rice. Corn or whole wheat flour tortillas. Meats and other proteins Lean meats. Poultry. Tofu. Seafood and shellfish. Dairy Low-fat or fat-free dairy products, such as yogurt, cottage cheese, or cheese. Beverages Water. Sugar-free drinks. Tea. Coffee. Low-fat or skim milk. Milk alternatives, such as soy or almond milk. Real fruit juice. Fats and oils Avocado. Canola or olive oil. Nuts and nut butters. Seeds. Seasonings and condiments Mustard. Relish. Low-fat, low-sugar ketchup and barbecue sauce. Low-fat or fat-free mayonnaise. Sweets and desserts Sugar-free sweets. The items listed above may not be a complete list of foods and beverages you can eat. Contact a dietitian for more information.   What foods should I limit or avoid? Meats and other  proteins Limit red meat to 1-2 times a week. Dairy [employer]. Fats and oils Palm oil and coconut oil. Fried foods. Other foods Processed foods. Foods that contain a lot of salt or sodium. Sweets and desserts Sweets that contain sugar. Beverages Sweetened drinks, such as sweet tea, milkshakes, iced sweet drinks, and sodas. Alcohol. The items listed above may not be a complete list of foods and beverages you should avoid. Contact a dietitian for more information. Where to find more information The [employer] of Diabetes and Digestive and Kidney Diseases: [employer] Summary  Nonalcoholic fatty liver disease is a condition that causes fat to build up in and around the liver.  Following a healthy diet can help to keep nonalcoholic fatty liver disease under control. Your diet should be rich in fruits, vegetables, whole grains, and lean proteins.  Limit  your intake of saturated fat. Saturated fat is found in foods that come from animals, including meat and dairy products such as butter, cheese, and whole milk.  This diet promotes weight loss, helps to control blood sugar levels, and helps to improve the way that the body uses insulin. This information is not intended to replace advice given to you by your health care provider. Make sure you discuss any questions you have with your health care provider. Document Revised: 08/05/2018 Document Reviewed: 05/05/2018 Elsevier Patient Education  [place].

## 2020-09-14 LAB — IRON,TIBC AND FERRITIN PANEL
%SAT: 31 % (calc) (ref 16–45)
Ferritin: 250 ng/mL — ABNORMAL HIGH (ref 16–232)
Iron: 97 ug/dL (ref 45–160)
TIBC: 309 mcg/dL (calc) (ref 250–450)

## 2020-09-14 LAB — LIPID PANEL
Cholesterol: 208 mg/dL — ABNORMAL HIGH (ref <200)
HDL: 64 mg/dL (ref >=50)
LDL Cholesterol (Calc): 122 mg/dL (calc) — ABNORMAL HIGH
Non-HDL Cholesterol (Calc): 144 mg/dL (calc) — ABNORMAL HIGH (ref <130)
Total CHOL/HDL Ratio: 3.3 (calc) (ref <5.0)
Triglycerides: 114 mg/dL (ref <150)

## 2020-09-14 LAB — VITAMIN D 25 HYDROXY (VIT D DEFICIENCY, FRACTURES): Vit D, 25-Hydroxy: 26 ng/mL — ABNORMAL LOW (ref 30–100)

## 2020-09-14 LAB — HEMOGLOBIN A1C
Hgb A1c MFr Bld: 5.5 % of total Hgb (ref <5.7)
Mean Plasma Glucose: 111 mg/dL
eAG (mmol/L): 6.2 mmol/L

## 2020-09-14 LAB — C-REACTIVE PROTEIN: CRP: 3.1 mg/L (ref <8.0)

## 2020-09-14 LAB — SEDIMENTATION RATE: Sed Rate: 14 mm/h (ref 0–30)

## 2020-09-14 LAB — TSH: TSH: 1.61 mIU/L (ref 0.40–4.50)

## 2020-09-16 ENCOUNTER — Telehealth: Payer: Self-pay | Admitting: Family Medicine

## 2020-09-16 MED ORDER — ADVAIR HFA 45-21 MCG/ACT IN AERO: 2.0000 | INHALATION_SPRAY | Freq: Two times a day (BID) | RESPIRATORY_TRACT | 12 refills | Status: DC

## 2020-09-16 MED ORDER — VALACYCLOVIR HCL 500 MG PO TABS: 500.0000 mg | ORAL_TABLET | Freq: Every day | ORAL | 4 refills | Status: DC

## 2020-09-16 NOTE — Telephone Encounter (Signed)
LVM for pt to CB regarding results.  

## 2020-09-16 NOTE — Telephone Encounter (Signed)
Please inform patient the following information: -Cholesterol has improved, her LDL/bad cholesterol is now 122 (was 144). -Both her inflammatory markers ESR and CRP are now in normal range, both were abnormal 1 month ago. -Her ferritin is greatly improved and now only to 250, which is almost normal and not concerning at this level.  The rest of her iron panel is now in normal range as well. -Her vitamin D is still low at 26.  I would encourage her to restart 1000 units of vitamin D daily. -She should also take B12 sublingual supplements of [phone] mcg a day, hers was extremely low prior. -A1c/diabetes screen is normal at 5.5 -Thyroid panel is normal.   I did not see where she had refills on her Valtrex or Advair, therefore I did go ahead and refill those for her.

## 2020-09-17 NOTE — Telephone Encounter (Signed)
LVM for pt to CB regarding results.  

## 2020-09-18 NOTE — Telephone Encounter (Signed)
LVM for pt to CB regarding results.  

## 2020-09-18 NOTE — Telephone Encounter (Signed)
Letter sent.

## 2020-09-20 ENCOUNTER — Ambulatory Visit: Payer: 59

## 2020-10-10 ENCOUNTER — Ambulatory Visit: Payer: 59

## 2020-10-10 ENCOUNTER — Telehealth: Payer: Self-pay

## 2020-10-10 NOTE — Telephone Encounter (Signed)
Pt went to ED 6/15

## 2020-10-10 NOTE — Telephone Encounter (Signed)
[facility] - Client TELEPHONE ADVICE RECORD [facility] Patient Name: Jade [name] [place] Gender: Female DOB: 08/01/1960 Age: 60 Y 10 M 23 D Return Phone Number: [phone] (Primary), [phone] (Secondary) Address: City/ State/ Zip: [place]  [address] Client [facility] - Client Client Site [facility] - Day Physician Raoul Pitch, [place] Contact Type Call Who Is Calling Patient / Member / Family / Caregiver Call Type Triage / Clinical Relationship To Patient Self Return Phone Number [phone] (Primary) Chief Complaint BREATHING - shortness of breath or sounds breathless Reason for Call Symptomatic / Request for [place] states she is positive for CVOID. She is having a hard time breathing. She is wanting to have an anti-viral medication called in. She does have asthma. Translation No Nurse Assessment Nurse: Jimmye Norman, RN, [place] Date/Time (Eastern Time): 10/09/2020 6:29:45 PM Confirm and document reason for call. If symptomatic, describe symptoms. ---Caller states she is positive for CVOID. She is having a hard time breathing. She is wanting to have an anti-viral medication called in. She does have asthma. Symptoms started yesterday, and worsening about 30 minutes ago. Does the patient have any new or worsening symptoms? ---Yes Will a triage be completed? ---Yes Related visit to physician within the last 2 weeks? ---No Does the PT have any chronic conditions? (i.e. diabetes, asthma, this includes High risk factors for pregnancy, etc.) ---Yes List chronic conditions. ---asthma Is this a behavioral health or substance abuse call? ---No Guidelines Guideline Title Affirmed Question Affirmed Notes Nurse Date/Time ([place] Time) COVID-19 - Diagnosed or Suspected SEVERE or constant chest pain or pressure (Exception: Mild central chest pain, present only  when coughing.) Jimmye Norman, RN, [place] 10/09/2020 6:30:22 PM PLEASE NOTE: All timestamps contained within this report are represented as [place] Standard Time. CONFIDENTIALTY NOTICE: This fax transmission is intended only for the addressee. It contains information that is legally privileged, confidential or otherwise protected from use or disclosure. If you are not the intended recipient, you are strictly prohibited from reviewing, disclosing, copying using or disseminating any of this information or taking any action in reliance on or regarding this information. If you have received this fax in error, please notify us immediately by telephone so that we can arrange for its return to [place]. Phone: [phone], Toll-Free: [phone], Fax: [phone] Page: 2 of 2 Call Id: [id number] [place]. Time Eilene Ghazi Time) Disposition Final User 10/09/2020 6:28:42 PM Send to Urgent Queue Rica Mote 10/09/2020 6:34:46 PM Call Created In Error Jimmye Norman, RN, Whitney 10/09/2020 6:35:14 PM Go to ED Now Yes Jimmye Norman, RN, [place] Caller Disagree/Comply Comply Caller Understands Yes PreDisposition InappropriateToAsk Care Advice Given Per Guideline GO TO ED NOW: * You need to be seen in the Emergency Department. Comments User: Myriam Forehand, RN Date/Time (Eastern Time): 10/09/2020 6:35:05 PM chart made under day, should have been made under night Referrals GO TO FACILITY UNDECIDED

## 2020-10-18 ENCOUNTER — Encounter: Payer: 59 | Admitting: Internal Medicine

## 2020-10-24 ENCOUNTER — Encounter: Payer: Self-pay | Admitting: Internal Medicine

## 2020-11-07 ENCOUNTER — Other Ambulatory Visit: Payer: Self-pay | Admitting: Orthopedic Surgery

## 2020-11-07 DIAGNOSIS — T1490XA Injury, unspecified, initial encounter: Secondary | ICD-10-CM

## 2020-11-12 ENCOUNTER — Encounter: Payer: Self-pay | Admitting: Internal Medicine

## 2020-11-15 ENCOUNTER — Ambulatory Visit (AMBULATORY_SURGERY_CENTER): Payer: 59 | Admitting: Internal Medicine

## 2020-11-15 ENCOUNTER — Encounter: Payer: Self-pay | Admitting: Internal Medicine

## 2020-11-15 ENCOUNTER — Other Ambulatory Visit: Payer: Self-pay

## 2020-11-15 VITALS — BP 109/78 | HR 73 | Temp 97.7°F | Resp 15 | Ht 67.0 in | Wt 209.0 lb

## 2020-11-15 DIAGNOSIS — R109 Unspecified abdominal pain: Secondary | ICD-10-CM

## 2020-11-15 DIAGNOSIS — R1310 Dysphagia, unspecified: Secondary | ICD-10-CM

## 2020-11-15 DIAGNOSIS — K2100 Gastro-esophageal reflux disease with esophagitis, without bleeding: Secondary | ICD-10-CM

## 2020-11-15 DIAGNOSIS — R194 Change in bowel habit: Secondary | ICD-10-CM

## 2020-11-15 DIAGNOSIS — Z86010 Personal history of colonic polyps: Secondary | ICD-10-CM | POA: Diagnosis present

## 2020-11-15 DIAGNOSIS — K222 Esophageal obstruction: Secondary | ICD-10-CM

## 2020-11-15 MED ORDER — SODIUM CHLORIDE 0.9 % IV SOLN: 500.0000 mL | Freq: Once | INTRAVENOUS | Status: DC

## 2020-11-15 MED ORDER — PANTOPRAZOLE SODIUM 40 MG PO TBEC: 40.0000 mg | DELAYED_RELEASE_TABLET | Freq: Every day | ORAL | 11 refills | Status: DC

## 2020-11-15 NOTE — Patient Instructions (Signed)
YOU HAD AN ENDOSCOPIC PROCEDURE TODAY AT [facility]:   Refer to the procedure report that was given to you for any specific questions about what was found during the examination.  If the procedure report does not answer your questions, please call your gastroenterologist to clarify.  If you requested that your care partner not be given the details of your procedure findings, then the procedure report has been included in a sealed envelope for you to review at your convenience later.  YOU SHOULD EXPECT: Some feelings of bloating in the abdomen. Passage of more gas than usual.  Walking can help get rid of the air that was put into your GI tract during the procedure and reduce the bloating. If you had a lower endoscopy (such as a colonoscopy or flexible sigmoidoscopy) you may notice spotting of blood in your stool or on the toilet paper. If you underwent a bowel prep for your procedure, you may not have a normal bowel movement for a few days.  Please Note:  You might notice some irritation and congestion in your nose or some drainage.  This is from the oxygen used during your procedure.  There is no need for concern and it should clear up in a day or so.  SYMPTOMS TO REPORT IMMEDIATELY:  Following lower endoscopy (colonoscopy or flexible sigmoidoscopy):  Excessive amounts of blood in the stool  Significant tenderness or worsening of abdominal pains  Swelling of the abdomen that is new, acute  Fever of 100F or higher  Following upper endoscopy (EGD)  Vomiting of blood or coffee ground material  New chest pain or pain under the shoulder blades  Painful or persistently difficult swallowing  New shortness of breath  Fever of 100F or higher  Black, tarry-looking stools  For urgent or emergent issues, a gastroenterologist can be reached at any hour by calling [phone]. Do not use MyChart messaging for urgent concerns.    DIET:  We do recommend a small meal at first, but  then you may proceed to your regular diet.  Drink plenty of fluids but you should avoid alcoholic beverages for 24 hours. Follow an Anti-Reflux Diet (see handout).  MEDICATIONS: Continue present medications. Start Pantoprazole 40 mg by mouth daily (#30 with 11 refills). This medication will help the inflammation of the esoophagus.  Please see handouts given to you by your recovery nurse.  Thank you for allowing [place] to provide for your healthcare needs today.  ACTIVITY:  You should plan to take it easy for the rest of today and you should NOT DRIVE or use heavy machinery until tomorrow (because of the sedation medicines used during the test).    FOLLOW UP: Our staff will call the number listed on your records 48-72 hours following your procedure to check on you and address any questions or concerns that you may have regarding the information given to you following your procedure. If we do not reach you, we will leave a message.  We will attempt to reach you two times.  During this call, we will ask if you have developed any symptoms of COVID 19. If you develop any symptoms (ie: fever, flu-like symptoms, shortness of breath, cough etc.) before then, please call [phone].  If you test positive for Covid 19 in the 2 weeks post procedure, please call and report this information to [place].    If any biopsies were taken you will be contacted by phone or by letter within the next 1-3  weeks.  Please call us at [phone] if you have not heard about the biopsies in 3 weeks.    SIGNATURES/CONFIDENTIALITY: You and/or your care partner have signed paperwork which will be entered into your electronic medical record.  These signatures attest to the fact that that the information above on your After Visit Summary has been reviewed and is understood.  Full responsibility of the confidentiality of this discharge information lies with you and/or your care-partner.

## 2020-11-15 NOTE — Op Note (Signed)
[facility] Patient Name: Jade [name] Procedure Date: 11/15/2020 12:59 PM MRN: [id number] Endoscopist: Docia Chuck. Henrene Pastor , MD Age: 60 Referring MD:  Date of Birth: Aug 26, 1960 Gender: Female Account #: [id number] Procedure:                Colonoscopy Indications:              High risk colon cancer surveillance: Personal                            history of non-advanced adenoma. Index examination                            June 2017 Medicines:                Monitored Anesthesia Care Procedure:                Pre-Anesthesia Assessment:                           - Prior to the procedure, a History and Physical                            was performed, and patient medications and                            allergies were reviewed. The patient's tolerance of                            previous anesthesia was also reviewed. The risks                            and benefits of the procedure and the sedation                            options and risks were discussed with the patient.                            All questions were answered, and informed consent                            was obtained. Prior Anticoagulants: The patient has                            taken no previous anticoagulant or antiplatelet                            agents. ASA Grade Assessment: II - A patient with                            mild systemic disease. After reviewing the risks                            and benefits, the patient was deemed in  satisfactory condition to undergo the procedure.                           After obtaining informed consent, the colonoscope                            was passed under direct vision. Throughout the                            procedure, the patient's blood pressure, pulse, and                            oxygen saturations were monitored continuously. The                            Colonoscope was introduced through the anus and                             advanced to the the cecum, identified by                            appendiceal orifice and ileocecal valve. The                            ileocecal valve, appendiceal orifice, and rectum                            were photographed. The quality of the bowel                            preparation was excellent. The colonoscopy was                            performed without difficulty. The patient tolerated                            the procedure well. The bowel preparation used was                            SUPREP via split dose instruction. Scope In: 1:20:38 PM Scope Out: 1:38:09 PM Scope Withdrawal Time: 0 hours 11 minutes 25 seconds  Total Procedure Duration: 0 hours 17 minutes 31 seconds  Findings:                 A few diverticula were found in the right colon.                           The exam was otherwise without abnormality on                            direct and retroflexion views. Complications:            No immediate complications. Estimated blood loss:  None. Estimated Blood Loss:     Estimated blood loss: none. Impression:               - Diverticulosis in the right colon.                           - The examination was otherwise normal on direct                            and retroflexion views.                           - No specimens collected. Recommendation:           - Repeat colonoscopy in 10 years for surveillance.                           - Patient has a contact number available for                            emergencies. The signs and symptoms of potential                            delayed complications were discussed with the                            patient. Return to normal activities tomorrow.                            Written discharge instructions were provided to the                            patient.                           - Resume previous diet.                           - Continue  present medications. Docia Chuck. Henrene Pastor, MD 11/15/2020 1:44:01 PM This report has been signed electronically.

## 2020-11-15 NOTE — Op Note (Signed)
[facility] Patient Name: Jade [name] Procedure Date: 11/15/2020 12:58 PM MRN: [id number] Endoscopist: Docia Chuck. Henrene Pastor , MD Age: 60 Referring MD:  Date of Birth: 08/23/1960 Gender: Female Account #: [id number] Procedure:                Upper GI endoscopy Indications:              Epigastric abdominal pain Medicines:                Monitored Anesthesia Care Procedure:                Pre-Anesthesia Assessment:                           - Prior to the procedure, a History and Physical                            was performed, and patient medications and                            allergies were reviewed. The patient's tolerance of                            previous anesthesia was also reviewed. The risks                            and benefits of the procedure and the sedation                            options and risks were discussed with the patient.                            All questions were answered, and informed consent                            was obtained. Prior Anticoagulants: The patient has                            taken no previous anticoagulant or antiplatelet                            agents. ASA Grade Assessment: II - A patient with                            mild systemic disease. After reviewing the risks                            and benefits, the patient was deemed in                            satisfactory condition to undergo the procedure.                           After obtaining informed consent, the endoscope was  passed under direct vision. Throughout the                            procedure, the patient's blood pressure, pulse, and                            oxygen saturations were monitored continuously. The                            Endoscope was introduced through the mouth, and                            advanced to the second part of duodenum. The upper                            GI endoscopy was  accomplished without difficulty.                            The patient tolerated the procedure well. Scope In: Scope Out: Findings:                 The esophagus revealed mild there was a large                            caliber ring or stricture. No Barrett's..                           The stomach was normal.                           The examined duodenum was normal.                           The cardia and gastric fundus were normal on                            retroflexion. Complications:            No immediate complications. Estimated Blood Loss:     Estimated blood loss: none. Impression:               1. GERD                           2. 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 Otherwise normal EGD Recommendation:           1. Antireflux diet                           2. Reflux precautions  3. Prescribe pantoprazole 40 mg daily; #30; 11                            refills. This medication will help the inflammation                            of the esophagus. Docia Chuck. Henrene Pastor, MD 11/15/2020 1:59:39 PM This report has been signed electronically.

## 2020-11-15 NOTE — Progress Notes (Signed)
A/ox3, pleased with MAC, report to RN 

## 2020-11-15 NOTE — Progress Notes (Signed)
Pt's states no medical or surgical changes since previsit or office visit. 

## 2020-11-19 ENCOUNTER — Telehealth: Payer: Self-pay | Admitting: *Deleted

## 2020-11-19 NOTE — Telephone Encounter (Signed)
  Follow up Call-  Call back number 11/15/2020  Post procedure Call Back phone  # [phone]  Permission to leave phone message Yes  Some recent data might be hidden     Patient questions:  Do you have a fever, pain , or abdominal swelling? No. Pain Score  0 *  Have you tolerated food without any problems? Yes.    Have you been able to return to your normal activities? Yes.    Do you have any questions about your discharge instructions: Diet   No. Medications  No. Follow up visit  No.  Do you have questions or concerns about your Care? No.  Actions: * If pain score is 4 or above: No action needed, pain <4.Have you developed a fever since your procedure? no  2.   Have you had an respiratory symptoms (SOB or cough) since your procedure? no  3.   Have you tested positive for COVID 19 since your procedure no  4.   Have you had any family members/close contacts diagnosed with the COVID 19 since your procedure?  no   If yes to any of these questions please route to Joylene John, RN and Joella Prince, RN

## 2020-11-21 ENCOUNTER — Other Ambulatory Visit: Payer: Self-pay

## 2020-11-21 ENCOUNTER — Ambulatory Visit
Admission: RE | Admit: 2020-11-21 | Discharge: 2020-11-21 | Disposition: A | Discharge status: Home / Self Care | Payer: Worker's Compensation | Source: Ambulatory Visit | Attending: Orthopedic Surgery | Admitting: Orthopedic Surgery

## 2020-11-21 DIAGNOSIS — T1490XA Injury, unspecified, initial encounter: Secondary | ICD-10-CM

## 2020-11-29 ENCOUNTER — Ambulatory Visit: Payer: 59 | Admitting: Cardiovascular Disease

## 2020-12-05 NOTE — Progress Notes (Signed)
Cardiology Office Note:   Date:  12/06/2020  NAME:  Jade [name]    MRN: [id number] DOB:  23-Jan-1961   PCP:  Ma Hillock, DO  Cardiologist:  None  Electrophysiologist:  None   Referring MD: Ma Hillock, DO   Chief Complaint  Patient presents with   Chest Pain    History of Present Illness:   Jade [name] is a 60 y.o. female with a hx of anxiety, depression who presents for follow-up of chest pain and palpitations. Seen in January of 2021 for possible cardiac chest pain. Did not completed echo or CCTA.   She reports her symptoms resolved and she did not complete her testing.  The past 3 to 4 months has had episodes of shortness of breath as well as chest pain.  She can also feel her heart racing.  She reports that she gets short of breath at any time.  Does not occur with activity.  Occurs mainly at rest.  Also describes a sensation of sharp pain in her chest.  Can occur daily.  No identifiable trigger.  Can last minutes.  She reports it resolves on its own.  It is not exertional.  It is not associate with rest.  She completed a BNP that was normal.  Cardiovascular examination remains normal.  EKG demonstrates normal sinus rhythm with no acute ischemic changes or evidence of infarction.  Recent lab work from PCP shows her TSH is 1.61.  Kidney function was normal in March.  Lipids are within normal limits.  Family history significant for heart disease in her father.  She does not smoke.  No alcohol consumption reported.  No drug use reported.  No updates to medical history.  A1c 5.5 TSH 1.61 T chol 208, HDL 64, LDL 122, TG 114  Past Medical History: Past Medical History:  Diagnosis Date   Anxiety    Asthma    uses inhaler prn   Chest pain    Colon polyps    Diarrhea    with constipation while taking diet pills (Alli)   History of rectal bleeding     2 times in past   HSV-2 (herpes simplex virus 2) infection    Palpitations    in past/due to Advil pm   Vitamin D  deficiency     Past Surgical History: Past Surgical History:  Procedure Laterality Date   ABDOMINAL HYSTERECTOMY  1998   fibroids. partial   CESAREAN SECTION     1 time   COLONOSCOPY W/ POLYPECTOMY  2017   RESECTOSCOPIC MYOMECTOMY      Current Medications: Current Meds  Medication Sig   fluticasone-salmeterol (ADVAIR HFA) 45-21 MCG/ACT inhaler Inhale 2 puffs into the lungs 2 (two) times daily.   metoprolol tartrate (LOPRESSOR) 100 MG tablet Take 1 tablet by mouth once for procedure.   pantoprazole (PROTONIX) 40 MG tablet Take 1 tablet (40 mg total) by mouth daily.   valACYclovir (VALTREX) 500 MG tablet Take 1 tablet (500 mg total) by mouth daily.   Vitamin D, Cholecalciferol, 25 MCG (1000 UT) CAPS Take 3 capsules by mouth. 3,000 units daily   Current Facility-Administered Medications for the 12/06/20 encounter (Office Visit) with Geralynn Rile, MD  Medication   0.9 %  sodium chloride infusion     Allergies:    Ciprocin-fluocin-procin [fluocinolone acetonide] and Sulfa antibiotics   Social History: Social History   Socioeconomic History   Marital status: Married    Spouse name: Not on file  Number of children: 1   Years of education: Not on file   Highest education level: Not on file  Occupational History   Occupation: Copywriter, advertising  Tobacco Use   Smoking status: Never   Smokeless tobacco: Never  Vaping Use   Vaping Use: Never used  Substance and Sexual Activity   Alcohol use: Not Currently    Alcohol/week: 1.0 - 2.0 standard drink    Types: 1 - 2 Glasses of wine per week    Comment: occ   Drug use: No   Sexual activity: Yes    Partners: Male    Birth control/protection: Surgical    Comment: DECLINED INSURANCE QUESTIONS,DES NEG  Other Topics Concern   Not on file  Social History Narrative   Married. One child.   College-educated, works as a Copywriter, advertising.   Smoke alarm in the home. Wears her seatbelt.   Feels safe in her relationship.    Social Determinants of Health   Financial Resource Strain: Not on file  Food Insecurity: Not on file  Transportation Needs: Not on file  Physical Activity: Not on file  Stress: Not on file  Social Connections: Not on file     Family History: The patient's family history includes Asthma in her mother; Atrial fibrillation in her brother and sister; Diabetes in her father and paternal uncle; Heart disease in her father; Hypertension in her father; Lung cancer in her paternal grandfather; Prostate cancer in her father; Thyroid disease in her sister. There is no history of Colon cancer, Colon polyps, Rectal cancer, Stomach cancer, or Esophageal cancer.  ROS:   All other ROS reviewed and negative. Pertinent positives noted in the HPI.     EKGs/Labs/Other Studies Reviewed:   The following studies were personally reviewed by me today:  EKG:  EKG is ordered today.  The ekg ordered today demonstrates normal sinus rhythm heart rate 67, no acute ischemic changes or evidence of infarction, and was personally reviewed by me.   Recent Labs: 07/19/2020: ALT 12; BUN 14; Creat 0.64; Hemoglobin 13.6; Platelets 244; Potassium 3.9; Sodium 141 09/13/2020: TSH 1.61   Recent Lipid Panel    Component Value Date/Time   CHOL 208 (H) 09/13/2020 1132   TRIG 114 09/13/2020 1132   HDL 64 09/13/2020 1132   CHOLHDL 3.3 09/13/2020 1132   VLDL 24.4 05/14/2017 0945   LDLCALC 122 (H) 09/13/2020 1132    Physical Exam:   VS:  BP 116/82   Pulse 67   Ht '5\' 6"'$  (1.676 m)   Wt 213 lb (96.6 kg)   SpO2 98%   BMI 34.38 kg/m    Wt Readings from Last 3 Encounters:  12/06/20 213 lb (96.6 kg)  11/15/20 209 lb (94.8 kg)  09/13/20 209 lb (94.8 kg)    General: Well nourished, well developed, in no acute distress Head: Atraumatic, normal size  Eyes: PEERLA, EOMI  Neck: Supple, no JVD Endocrine: No thryomegaly Cardiac: Normal S1, S2; RRR; no murmurs, rubs, or gallops Lungs: Clear to auscultation bilaterally, no  wheezing, rhonchi or rales  Abd: Soft, nontender, no hepatomegaly  Ext: No edema, pulses 2+ Musculoskeletal: No deformities, BUE and BLE strength normal and equal Skin: Warm and dry, no rashes   Neuro: Alert and oriented to person, place, time, and situation, CNII-XII grossly intact, no focal deficits  Psych: Normal mood and affect   ASSESSMENT:   Tany Rueb is a 60 y.o. female who presents for the following: 1. Other chest pain  2. Palpitations   3. SOB (shortness of breath) on exertion     PLAN:   1. Other chest pain 2. Palpitations 3. SOB (shortness of breath) on exertion -Possible cardiac chest pain.  Described as sharp.  Occurring at rest.  Lasting minutes.  No triggers.  Not exertional.  Alleviated by rest.  She also reports intermittent shortness of breath.  No evidence of heart failure.  Examination is normal.  BNP for similar symptoms was normal in 2020.  I see no need to repeat this.  I suspect this could be heartburn versus anxiety.  She is not overtly stressed or depressed lately however her symptoms are rather not concerning for cardiac etiology.  She does have a family history of heart disease.  She is obese.  We will proceed with coronary CTA.  100 mg of metoprolol tartrate 2 hours before the scan.  She will give [place] a BMP today.  We also will check an echocardiogram.  If the above work-up is negative would recommend monitoring versus acid reflux treatment.      Disposition: Return if symptoms worsen or fail to improve.  Medication Adjustments/Labs and Tests Ordered: Current medicines are reviewed at length with the patient today.  Concerns regarding medicines are outlined above.  Orders Placed This Encounter  Procedures   CT CORONARY MORPH W/CTA COR W/SCORE W/CA W/CM &/OR WO/CM   Basic metabolic panel   EKG [id number]   ECHOCARDIOGRAM COMPLETE    Meds ordered this encounter  Medications   metoprolol tartrate (LOPRESSOR) 100 MG tablet    Sig: Take 1 tablet by  mouth once for procedure.    Dispense:  1 tablet    Refill:  0     Patient Instructions  Medication Instructions:  Take Metoprolol Tartrate 100 mg two hours before the CT when scheduled.   *If you need a refill on your cardiac medications before your next appointment, please call your pharmacy*   Lab Work: BMET today   If you have labs (blood work) drawn today and your tests are completely normal, you will receive your results only by: [place] (if you have MyChart) OR A paper copy in the mail If you have any lab test that is abnormal or we need to change your treatment, we will call you to review the results.   Testing/Procedures: Echocardiogram - Your physician has requested that you have an echocardiogram. Echocardiography is a painless test that uses sound waves to create images of your heart. It provides your doctor with information about the size and shape of your heart and how well your heart's chambers and valves are working. This procedure takes approximately one hour. There are no restrictions for this procedure. This will be performed at our [facility] location - [address], [address].   Your physician has requested that you have cardiac CT. Cardiac computed tomography (CT) is a painless test that uses an x-ray machine to take clear, detailed pictures of your heart. For further information please visit [email or web address]. Please follow instruction sheet as given.   Follow-Up: At [facility], you and your health needs are our priority.  As part of our continuing mission to provide you with exceptional heart care, we have created designated Provider Care Teams.  These Care Teams include your primary Cardiologist (physician) and Advanced Practice Providers (APPs -  Physician Assistants and Nurse Practitioners) who all work together to provide you with the care you need, when you need it.  We recommend  signing up for the patient portal called "MyChart".  Sign up  information is provided on this After Visit Summary.  MyChart is used to connect with patients for Virtual Visits (Telemedicine).  Patients are able to view lab/test results, encounter notes, upcoming appointments, etc.  Non-urgent messages can be sent to your provider as well.   To learn more about what you can do with MyChart, go to [email or web address].    Your next appointment:   As needed (based on results)  The format for your next appointment:   In Person  Provider:   Eleonore Chiquito, MD   Other Instructions   Your cardiac CT will be scheduled at one of the below locations:   [facility] [address] [place], [place] [address] [phone]  If scheduled at [facility], please arrive at the [facility] main entrance (entrance A) of [facility] 30 minutes prior to test start time. Proceed to the [facility] Radiology Department (first floor) to check-in and test prep.  Please follow these instructions carefully (unless otherwise directed):   On the Night Before the Test: Be sure to Drink plenty of water. Do not consume any caffeinated/decaffeinated beverages or chocolate 12 hours prior to your test. Do not take any antihistamines 12 hours prior to your test.  On the Day of the Test: Drink plenty of water until 1 hour prior to the test. Do not eat any food 4 hours prior to the test. You may take your regular medications prior to the test.  Take metoprolol (Lopressor) two hours prior to test. HOLD Furosemide/Hydrochlorothiazide morning of the test. FEMALES- please wear underwire-free bra if available, avoid dresses & tight clothing      After the Test: Drink plenty of water. After receiving IV contrast, you may experience a mild flushed feeling. This is normal. On occasion, you may experience a mild rash up to 24 hours after the test. This is not dangerous. If this occurs, you can take Benadryl 25 mg and increase your fluid intake. If  you experience trouble breathing, this can be serious. If it is severe call 911 IMMEDIATELY. If it is mild, please call our office. If you take any of these medications: Glipizide/Metformin, Avandament, Glucavance, please do not take 48 hours after completing test unless otherwise instructed.  Please allow 2-4 weeks for scheduling of routine cardiac CTs. Some insurance companies require a pre-authorization which may delay scheduling of this test.   For non-scheduling related questions, please contact the cardiac imaging nurse navigator should you have any questions/concerns: Marchia Bond, Cardiac Imaging Nurse Navigator Gordy Clement, Cardiac Imaging Nurse Navigator [facility] Direct Office Dial: [phone]   For scheduling needs, including cancellations and rescheduling, please call [place], [phone].    Time Spent with Patient: I have spent a total of 35 minutes with patient reviewing hospital notes, telemetry, EKGs, labs and examining the patient as well as establishing an assessment and plan that was discussed with the patient.  > 50% of time was spent in direct patient care.  Signed, Addison Naegeli. Audie Box, MD, [facility]  [address] [place], [place] [address] [phone]  12/06/2020 8:55 AM

## 2020-12-06 ENCOUNTER — Ambulatory Visit: Payer: 59 | Admitting: Cardiovascular Disease

## 2020-12-06 ENCOUNTER — Encounter: Payer: Self-pay | Admitting: Cardiovascular Disease

## 2020-12-06 ENCOUNTER — Other Ambulatory Visit: Payer: Self-pay

## 2020-12-06 VITALS — BP 116/82 | HR 67 | Ht 66.0 in | Wt 213.0 lb

## 2020-12-06 DIAGNOSIS — R0789 Other chest pain: Secondary | ICD-10-CM

## 2020-12-06 DIAGNOSIS — R002 Palpitations: Secondary | ICD-10-CM

## 2020-12-06 DIAGNOSIS — R0602 Shortness of breath: Secondary | ICD-10-CM

## 2020-12-06 LAB — BASIC METABOLIC PANEL
BUN/Creatinine Ratio: 19 (ref 9–23)
BUN: 12 mg/dL (ref 6–24)
CO2: 22 mmol/L (ref 20–29)
Calcium: 9 mg/dL (ref 8.7–10.2)
Chloride: 104 mmol/L (ref 96–106)
Creatinine, Ser: 0.63 mg/dL (ref 0.57–1.00)
Glucose: 98 mg/dL (ref 65–99)
Potassium: 4 mmol/L (ref 3.5–5.2)
Sodium: 140 mmol/L (ref 134–144)
eGFR: 103 mL/min/{1.73_m2} (ref >59)

## 2020-12-06 MED ORDER — METOPROLOL TARTRATE 100 MG PO TABS: ORAL_TABLET | ORAL | 0 refills | Status: DC

## 2020-12-06 NOTE — Patient Instructions (Signed)
Medication Instructions:  Take Metoprolol Tartrate 100 mg two hours before the CT when scheduled.   *If you need a refill on your cardiac medications before your next appointment, please call your pharmacy*   Lab Work: BMET today   If you have labs (blood work) drawn today and your tests are completely normal, you will receive your results only by: [place] (if you have MyChart) OR A paper copy in the mail If you have any lab test that is abnormal or we need to change your treatment, we will call you to review the results.   Testing/Procedures: Echocardiogram - Your physician has requested that you have an echocardiogram. Echocardiography is a painless test that uses sound waves to create images of your heart. It provides your doctor with information about the size and shape of your heart and how well your heart's chambers and valves are working. This procedure takes approximately one hour. There are no restrictions for this procedure. This will be performed at our [facility] location - [address].   Your physician has requested that you have cardiac CT. Cardiac computed tomography (CT) is a painless test that uses an x-ray machine to take clear, detailed pictures of your heart. For further information please visit [email or web address]. Please follow instruction sheet as given.   Follow-Up: At [facility], you and your health needs are our priority.  As part of our continuing mission to provide you with exceptional heart care, we have created designated Provider Care Teams.  These Care Teams include your primary Cardiologist (physician) and Advanced Practice Providers (APPs -  Physician Assistants and Nurse Practitioners) who all work together to provide you with the care you need, when you need it.  We recommend signing up for the patient portal called "MyChart".  Sign up information is provided on this After Visit Summary.  MyChart is used to connect with patients  for Virtual Visits (Telemedicine).  Patients are able to view lab/test results, encounter notes, upcoming appointments, etc.  Non-urgent messages can be sent to your provider as well.   To learn more about what you can do with MyChart, go to [email or web address].    Your next appointment:   As needed (based on results)  The format for your next appointment:   In Person  Provider:   Eleonore Chiquito, MD   Other Instructions   Your cardiac CT will be scheduled at one of the below locations:   [facility] [address] [place], [place] [address] [phone]  If scheduled at [facility], please arrive at the [facility] main entrance (entrance A) of [facility] 30 minutes prior to test start time. Proceed to the [facility] Radiology Department (first floor) to check-in and test prep.  Please follow these instructions carefully (unless otherwise directed):   On the Night Before the Test: Be sure to Drink plenty of water. Do not consume any caffeinated/decaffeinated beverages or chocolate 12 hours prior to your test. Do not take any antihistamines 12 hours prior to your test.  On the Day of the Test: Drink plenty of water until 1 hour prior to the test. Do not eat any food 4 hours prior to the test. You may take your regular medications prior to the test.  Take metoprolol (Lopressor) two hours prior to test. HOLD Furosemide/Hydrochlorothiazide morning of the test. FEMALES- please wear underwire-free bra if available, avoid dresses & tight clothing      After the Test:  Drink plenty of water. After receiving IV contrast, you may experience a mild flushed feeling. This is normal. On occasion, you may experience a mild rash up to 24 hours after the test. This is not dangerous. If this occurs, you can take Benadryl 25 mg and increase your fluid intake. If you experience trouble breathing, this can be serious. If it is severe call 911 IMMEDIATELY. If  it is mild, please call our office. If you take any of these medications: Glipizide/Metformin, Avandament, Glucavance, please do not take 48 hours after completing test unless otherwise instructed.  Please allow 2-4 weeks for scheduling of routine cardiac CTs. Some insurance companies require a pre-authorization which may delay scheduling of this test.   For non-scheduling related questions, please contact the cardiac imaging nurse navigator should you have any questions/concerns: Marchia Bond, Cardiac Imaging Nurse Navigator Gordy Clement, Cardiac Imaging Nurse Navigator [place] [facility] Direct Office Dial: [phone]   For scheduling needs, including cancellations and rescheduling, please call [place], [phone].

## 2020-12-13 ENCOUNTER — Other Ambulatory Visit: Payer: Self-pay | Admitting: Cardiovascular Disease

## 2020-12-17 ENCOUNTER — Telehealth (HOSPITAL_COMMUNITY): Payer: Self-pay | Admitting: *Deleted

## 2020-12-17 NOTE — Telephone Encounter (Signed)
Attempted to call patient regarding upcoming cardiac CT appointment. °Left message on voicemail with name and callback number ° °Mclane Arora RN Navigator Cardiac Imaging °[facility] °[phone] Office °[phone] Cell ° °

## 2020-12-19 ENCOUNTER — Ambulatory Visit (HOSPITAL_COMMUNITY)
Admission: RE | Admit: 2020-12-19 | Discharge: 2020-12-19 | Disposition: A | Discharge status: Home / Self Care | Payer: 59 | Source: Ambulatory Visit | Attending: Cardiovascular Disease | Admitting: Cardiovascular Disease

## 2020-12-19 ENCOUNTER — Other Ambulatory Visit: Payer: Self-pay

## 2020-12-19 DIAGNOSIS — R0789 Other chest pain: Secondary | ICD-10-CM

## 2020-12-19 DIAGNOSIS — I700 Atherosclerosis of aorta: Secondary | ICD-10-CM

## 2020-12-19 MED ORDER — IOHEXOL 350 MG/ML SOLN
80.0000 mL | Freq: Once | INTRAVENOUS | Status: AC | PRN
  Administered 2020-12-19: 80 mL via INTRAVENOUS

## 2020-12-19 MED ORDER — NITROGLYCERIN 0.4 MG SL SUBL
SUBLINGUAL_TABLET | SUBLINGUAL | Status: AC
  Administered 2020-12-19: 0.4 mg via SUBLINGUAL
  Filled 2020-12-19: qty 1

## 2020-12-19 MED ORDER — NITROGLYCERIN 0.4 MG SL SUBL: 0.8000 mg | SUBLINGUAL_TABLET | Freq: Once | SUBLINGUAL | Status: AC

## 2020-12-20 ENCOUNTER — Ambulatory Visit (INDEPENDENT_AMBULATORY_CARE_PROVIDER_SITE_OTHER): Payer: 59

## 2020-12-20 DIAGNOSIS — Z23 Encounter for immunization: Secondary | ICD-10-CM | POA: Diagnosis not present

## 2020-12-23 ENCOUNTER — Ambulatory Visit: Payer: 59

## 2021-01-02 ENCOUNTER — Other Ambulatory Visit: Payer: Self-pay

## 2021-01-02 ENCOUNTER — Ambulatory Visit (HOSPITAL_COMMUNITY): Payer: 59 | Attending: Cardiology

## 2021-01-02 DIAGNOSIS — R0789 Other chest pain: Secondary | ICD-10-CM | POA: Diagnosis present

## 2021-01-02 LAB — ECHOCARDIOGRAM COMPLETE
Area-P 1/2: 3.99 cm2
S' Lateral: 2.1 cm

## 2021-01-10 ENCOUNTER — Telehealth: Payer: Self-pay

## 2021-01-10 NOTE — Telephone Encounter (Signed)
Patient is calling in stating she received a bill from [facility] 09/13/20 and Vitamin D has spoke with [facility], they advised that the wrong code was used to call to have it fixed.

## 2021-01-10 NOTE — Telephone Encounter (Signed)
Not sure if you have received any papers for this pt but I have not seen anything that I can remember.

## 2021-01-13 NOTE — Telephone Encounter (Signed)
Spoke with someone at [facility] and dx h/o vit d deficiency (Z86.39) was already used for billing on labs completed on 09/13/20. They advised that she call her insurance and follow up with them. Even if bill resubmitted to insurance, no guarantee it will not not be denied again. LM for pt to return call

## 2021-01-13 NOTE — Telephone Encounter (Signed)
Jade [name] - can you call [facility] or email Nonnie Done to see if they have H/O vitamin D deficiency [Z86.39] attached to bill please. They may be able to resubmit to ins.

## 2021-01-16 ENCOUNTER — Ambulatory Visit: Payer: Self-pay

## 2021-02-13 ENCOUNTER — Ambulatory Visit
Admission: RE | Admit: 2021-02-13 | Discharge: 2021-02-13 | Disposition: A | Discharge status: Home / Self Care | Payer: 59 | Source: Ambulatory Visit | Attending: Family Medicine | Admitting: Family Medicine

## 2021-02-13 ENCOUNTER — Other Ambulatory Visit: Payer: Self-pay

## 2021-02-13 DIAGNOSIS — Z1231 Encounter for screening mammogram for malignant neoplasm of breast: Secondary | ICD-10-CM

## 2021-02-22 ENCOUNTER — Other Ambulatory Visit: Payer: Self-pay | Admitting: Obstetrics & Gynecology

## 2021-04-04 ENCOUNTER — Telehealth: Payer: Self-pay | Admitting: Family Medicine

## 2021-04-04 NOTE — Telephone Encounter (Signed)
Message sent to rep at [facility] to review what was filed to pts insurance for Vit D lab DX/codes

## 2021-04-04 NOTE — Telephone Encounter (Signed)
Please advise 

## 2021-04-11 NOTE — Telephone Encounter (Signed)
[facility] to refile for vit d def

## 2021-05-08 ENCOUNTER — Other Ambulatory Visit: Payer: Self-pay | Admitting: Orthopedic Surgery

## 2021-05-08 DIAGNOSIS — M25562 Pain in left knee: Secondary | ICD-10-CM

## 2021-05-26 ENCOUNTER — Ambulatory Visit
Admission: RE | Admit: 2021-05-26 | Discharge: 2021-05-26 | Disposition: A | Discharge status: Home / Self Care | Payer: Worker's Compensation | Source: Ambulatory Visit | Attending: Orthopedic Surgery | Admitting: Orthopedic Surgery

## 2021-05-26 ENCOUNTER — Other Ambulatory Visit: Payer: Self-pay

## 2021-05-26 DIAGNOSIS — M25562 Pain in left knee: Secondary | ICD-10-CM

## 2021-08-14 LAB — HEPATITIS B SURFACE ANTIGEN: Hepatitis B Surface Ag: 6.76

## 2021-08-19 ENCOUNTER — Encounter: Payer: Self-pay | Admitting: Family Medicine

## 2021-08-19 ENCOUNTER — Ambulatory Visit (INDEPENDENT_AMBULATORY_CARE_PROVIDER_SITE_OTHER): Payer: Self-pay | Admitting: Family Medicine

## 2021-08-19 VITALS — BP 106/71 | HR 77 | Temp 97.6°F | Ht 66.0 in | Wt 214.0 lb

## 2021-08-19 DIAGNOSIS — M79646 Pain in unspecified finger(s): Secondary | ICD-10-CM

## 2021-08-19 DIAGNOSIS — S6991XA Unspecified injury of right wrist, hand and finger(s), initial encounter: Secondary | ICD-10-CM | POA: Insufficient documentation

## 2021-08-19 NOTE — Progress Notes (Signed)
? ? ? ?This visit occurred during the SARS-CoV-2 public health emergency.  Safety protocols were in place, including screening questions prior to the visit, additional usage of staff PPE, and extensive cleaning of exam room while observing appropriate contact time as indicated for disinfecting solutions.  ? ? ?Jade [name] , 11/07/1960, 61 y.o., female ?MRN: [id number] ?Patient Care Team  ?  Relationship Specialty Notifications Start End  ?Ma Hillock, DO PCP - General Family Medicine  05/14/17   ?Irene Shipper, MD Consulting Physician Gastroenterology  05/14/17   ?Etta Quill, OD Referring Physician Optometry  05/14/17   ?Valora Piccolo, MD Consulting Physician Allergy  05/14/17   ? ? ?Chief Complaint  ?Patient presents with  ? Laceration  ?  Pt reports a cut on R 3rd digit 10 days ago with a knife; pt c/o swelling, and redness; pt currently taking abx for ear infection at this time   ? ?  ?Subjective: Pt presents for an OV with complaints of possible right 3rd digit finger infection after laceration 10 days ago. She reports she cut herself with a knife at home. Tdap UTD 08/2020.  Patient was started on amoxicillin twice daily for an ear infection a few days ago.  She has concerns that her finger is becoming infected because it is mildly red.  She denies any drainage.  She reports she cleaned laceration out well and placed antibiotic ointment and Covered.  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 ? ? ? ?  08/19/2021  ?  4:14 PM 09/13/2020  ? 10:49 AM 07/19/2020  ?  2:37 PM 05/14/2017  ?  9:03 AM  ?Depression screen PHQ 2/9  ?Decreased Interest 0 0 0 0  ?Down, Depressed, Hopeless 0 0 0 0  ?PHQ - 2 Score 0 0 0 0  ? ? ?Allergies  ?Allergen Reactions  ? Ciprocin-Fluocin-Procin [Fluocinolone Acetonide]   ?  SOB  ? Sulfa Antibiotics   ?  hives  ? ?Social History  ? ?Social History Narrative  ? Married. One child.  ? College-educated, works as a Copywriter, advertising.  ? Smoke  alarm in the home. Wears her seatbelt.  ? Feels safe in her relationship.  ? ?Past Medical History:  ?Diagnosis Date  ? Anxiety   ? Asthma   ? uses inhaler prn  ? Chest pain   ? Colon polyps   ? Diarrhea   ? with constipation while taking diet pills (Alli)  ? History of rectal bleeding   ?  2 times in past  ? HSV-2 (herpes simplex virus 2) infection   ? Palpitations   ? in past/due to Advil pm  ? Vitamin D deficiency   ? ?Past Surgical History:  ?Procedure Laterality Date  ? ABDOMINAL HYSTERECTOMY  1998  ? fibroids. partial  ? CESAREAN SECTION    ? 1 time  ? COLONOSCOPY W/ POLYPECTOMY  2017  ? RESECTOSCOPIC MYOMECTOMY    ? ?Family History  ?Problem Relation Age of Onset  ? Asthma Mother   ? Diabetes Father   ? Hypertension Father   ? Heart disease Father   ? Prostate cancer Father   ? Atrial fibrillation Sister   ? Thyroid disease Sister   ? Atrial fibrillation Brother   ? Diabetes Paternal Uncle   ? Lung cancer Paternal Grandfather   ? Colon cancer Neg Hx   ? Colon polyps Neg Hx   ? Rectal cancer Neg Hx   ?  Stomach cancer Neg Hx   ? Esophageal cancer Neg Hx   ? ?Allergies as of 08/19/2021   ? ?   Reactions  ? Ciprocin-fluocin-procin [fluocinolone Acetonide]   ? SOB  ? Sulfa Antibiotics   ? hives  ? ?  ? ?  ?Medication List  ?  ? ?  ? Accurate as of August 19, 2021  4:51 PM. If you have any questions, ask your nurse or doctor.  ?  ?  ? ?  ? ?Advair HFA 45-21 MCG/ACT inhaler ?Generic drug: fluticasone-salmeterol ?Inhale 2 puffs into the lungs 2 (two) times daily. ?  ?amoxicillin 875 MG tablet ?Commonly known as: AMOXIL ?Take 875 mg by mouth 2 (two) times daily. ?  ?metoprolol tartrate 100 MG tablet ?Commonly known as: LOPRESSOR ?Take 1 tablet by mouth once for procedure. ?  ?pantoprazole 40 MG tablet ?Commonly known as: PROTONIX ?Take 1 tablet (40 mg total) by mouth daily. ?  ?valACYclovir 500 MG tablet ?Commonly known as: VALTREX ?Take 1 tablet (500 mg total) by mouth daily. ?  ?Vitamin D (Cholecalciferol) 25 MCG  (1000 UT) Caps ?Take 3 capsules by mouth. 3,000 units daily ?  ? ?  ? ? ?All past medical history, surgical history, allergies, family history, immunizations andmedications were updated in the EMR today and reviewed under the history and medication portions of their EMR.    ? ?ROS ?Negative, with the exception of above mentioned in HPI ? ? ?Objective:  ?BP 106/71   Pulse 77   Temp 97.6 ?F (36.4 ?C) (Oral)   Ht '5\' 6"'$  (1.676 m)   Wt 214 lb (97.1 kg)   SpO2 97%   BMI 34.54 kg/m?  ?Body mass index is 34.54 kg/m?Marland Kitchen ?Physical Exam ?Vitals and nursing note reviewed.  ?Constitutional:   ?   General: She is not in acute distress. ?   Appearance: Normal appearance. She is normal weight. She is not ill-appearing or toxic-appearing.  ?Eyes:  ?   Extraocular Movements: Extraocular movements intact.  ?   Conjunctiva/sclera: Conjunctivae normal.  ?   Pupils: Pupils are equal, round, and reactive to light.  ?Musculoskeletal:     ?   General: Tenderness and signs of injury present. No swelling. Normal range of motion.  ?   Comments: Third finger: V shaped laceration DIP of right third finger.  No erythema.  Wound is well approximated.  No drainage or bleeding.  ?Neurological:  ?   Mental Status: She is alert and oriented to person, place, and time. Mental status is at baseline.  ?Psychiatric:     ?   Mood and Affect: Mood normal.     ?   Behavior: Behavior normal.     ?   Thought Content: Thought content normal.     ?   Judgment: Judgment normal.  ? ?No results found. ?No results found. ?No results found for this or any previous visit (from the past 24 hour(s)). ? ?Assessment/Plan: ?Jade [name] is a 62 y.o. female present for OV for  ?Pain of finger/Finger injury, right, initial encounter ?Wound looks well approximated and no signs of infection on exam today.  She has full range of motion ruling out tendon injury.  Suspect her discomfort is secondary to tension along the laceration line.  Placed 3 Steri-Strips to relieve  tension and provided patient with a finger guard if needed during work hours. ?Follow-up as needed ? ?Reviewed expectations re: course of current medical issues. ?Discussed self-management of symptoms. ?Outlined signs and  symptoms indicating need for more acute intervention. ?Patient verbalized understanding and all questions were answered. ?Patient received an After-Visit Summary. ? ? ? ?No orders of the defined types were placed in this encounter. ? ?No orders of the defined types were placed in this encounter. ? ?Referral Orders  ?No referral(s) requested today  ? ? ? ?Note is dictated utilizing voice recognition software. Although note has been proof read prior to signing, occasional typographical errors still can be missed. If any questions arise, please do not hesitate to call for verification.  ? ?electronically signed by: ? ?Howard Pouch, DO  ?[facility] [place] ? ? ? ?

## 2021-09-08 DIAGNOSIS — M2242 Chondromalacia patellae, left knee: Secondary | ICD-10-CM | POA: Insufficient documentation

## 2021-09-19 ENCOUNTER — Encounter: Payer: Self-pay | Admitting: Family Medicine

## 2021-09-19 ENCOUNTER — Ambulatory Visit: Payer: 59 | Admitting: Family Medicine

## 2021-09-19 VITALS — BP 100/69 | HR 64 | Temp 98.1°F | Ht 66.0 in | Wt 214.0 lb

## 2021-09-19 DIAGNOSIS — J4540 Moderate persistent asthma, uncomplicated: Secondary | ICD-10-CM

## 2021-09-19 DIAGNOSIS — Z131 Encounter for screening for diabetes mellitus: Secondary | ICD-10-CM | POA: Diagnosis not present

## 2021-09-19 DIAGNOSIS — Z79899 Other long term (current) drug therapy: Secondary | ICD-10-CM

## 2021-09-19 DIAGNOSIS — Z23 Encounter for immunization: Secondary | ICD-10-CM | POA: Diagnosis not present

## 2021-09-19 DIAGNOSIS — B009 Herpesviral infection, unspecified: Secondary | ICD-10-CM

## 2021-09-19 DIAGNOSIS — Z0000 Encounter for general adult medical examination without abnormal findings: Secondary | ICD-10-CM

## 2021-09-19 DIAGNOSIS — Z8639 Personal history of other endocrine, nutritional and metabolic disease: Secondary | ICD-10-CM

## 2021-09-19 DIAGNOSIS — Z13220 Encounter for screening for lipoid disorders: Secondary | ICD-10-CM

## 2021-09-19 DIAGNOSIS — E559 Vitamin D deficiency, unspecified: Secondary | ICD-10-CM | POA: Diagnosis not present

## 2021-09-19 DIAGNOSIS — E669 Obesity, unspecified: Secondary | ICD-10-CM

## 2021-09-19 DIAGNOSIS — Z1231 Encounter for screening mammogram for malignant neoplasm of breast: Secondary | ICD-10-CM

## 2021-09-19 LAB — COMPREHENSIVE METABOLIC PANEL
ALT: 13 U/L (ref 0–35)
AST: 17 U/L (ref 0–37)
Albumin: 4.2 g/dL (ref 3.5–5.2)
Alkaline Phosphatase: 60 U/L (ref 39–117)
BUN: 19 mg/dL (ref 6–23)
CO2: 25 mEq/L (ref 19–32)
Calcium: 9.3 mg/dL (ref 8.4–10.5)
Chloride: 105 mEq/L (ref 96–112)
Creatinine, Ser: 0.67 mg/dL (ref 0.40–1.20)
GFR: 96.06 mL/min (ref >60.00)
Glucose, Bld: 100 mg/dL — ABNORMAL HIGH (ref 70–99)
Potassium: 3.9 mEq/L (ref 3.5–5.1)
Sodium: 140 mEq/L (ref 135–145)
Total Bilirubin: 0.7 mg/dL (ref 0.2–1.2)
Total Protein: 6.9 g/dL (ref 6.0–8.3)

## 2021-09-19 LAB — LIPID PANEL
Cholesterol: 223 mg/dL — ABNORMAL HIGH (ref 0–200)
HDL: 56.8 mg/dL (ref >39.00)
LDL Cholesterol: 142 mg/dL — ABNORMAL HIGH (ref 0–99)
NonHDL: 166.49
Total CHOL/HDL Ratio: 4
Triglycerides: 123 mg/dL (ref 0.0–149.0)
VLDL: 24.6 mg/dL (ref 0.0–40.0)

## 2021-09-19 LAB — CBC
HCT: 40.8 % (ref 36.0–46.0)
Hemoglobin: 13.6 g/dL (ref 12.0–15.0)
MCHC: 33.3 g/dL (ref 30.0–36.0)
MCV: 86.5 fl (ref 78.0–100.0)
Platelets: 209 10*3/uL (ref 150.0–400.0)
RBC: 4.71 Mil/uL (ref 3.87–5.11)
RDW: 13.6 % (ref 11.5–15.5)
WBC: 6.4 10*3/uL (ref 4.0–10.5)

## 2021-09-19 LAB — VITAMIN D 25 HYDROXY (VIT D DEFICIENCY, FRACTURES): VITD: 23.02 ng/mL — ABNORMAL LOW (ref 30.00–100.00)

## 2021-09-19 LAB — HEMOGLOBIN A1C: Hgb A1c MFr Bld: 5.8 % (ref 4.6–6.5)

## 2021-09-19 NOTE — Progress Notes (Signed)
Patient ID: Jade [name], female  DOB: Sep 06, 1960, 61 y.o.   MRN: [id number] Patient Care Team    Relationship Specialty Notifications Start End  Ma Hillock, DO PCP - General Family Medicine  05/14/17   Irene Shipper, MD Consulting Physician Gastroenterology  05/14/17   Etta Quill, [place] Referring Physician Optometry  05/14/17   Valora Piccolo, MD Consulting Physician Allergy  05/14/17     Chief Complaint  Patient presents with   Annual Exam    Pt is fasting    Subjective: Jade [name] is a 61 y.o.  Female  present for CPE  All past medical history, surgical history, allergies, family history, immunizations, medications and social history were updated in the electronic medical record today. All recent labs, ED visits and hospitalizations within the last year were reviewed.  Health maintenance:  Colonoscopy: completed 11/15/2020, by Dr. Henrene Pastor, 10 yr follow up Mammogram: completed: 02/13/2021- [facility] Cervical cancer screening: History of hysterectomy not indicated Immunizations: tdap UTD 08/2020, Influenza (encouraged yearly), covid completed, shingrix completed. Pt reports she needs a booster to her hep B series. She had serology at her work that resulted with negative immunity. She reports she had the hep b series in the past Infectious disease screening: HIV and hep c completed  DEXA: N/A Assistive device: none Oxygen LKT:GYBW Patient has a Dental home. Hospitalizations/ED visits: reviewed     09/19/2021    8:09 AM 08/19/2021    4:14 PM 09/13/2020   10:49 AM 07/19/2020    2:37 PM 05/14/2017    9:03 AM  Depression screen PHQ 2/9  Decreased Interest 0 0 0 0 0  Down, Depressed, Hopeless 0 0 0 0 0  PHQ - 2 Score 0 0 0 0 0      05/14/2017    9:19 AM  GAD 7 : Generalized Anxiety Score  Nervous, Anxious, on Edge 0  Control/stop worrying 0  Worry too much - different things 0  Trouble relaxing 0  Restless 0  Easily annoyed or irritable 0  Afraid - awful might  happen 0  Total GAD 7 Score 0    Immunization History  Administered Date(s) Administered   Hepb-cpg 09/19/2021   Influenza,inj,Quad PF,6+ Mos 03/31/2019   Influenza-Unspecified 01/25/2017, 01/25/2021   Moderna Sars-Covid-2 Vaccination 04/18/2019, 05/12/2019   Tdap 09/13/2020   Zoster Recombinat (Shingrix) 09/13/2020, 12/20/2020   Past Medical History:  Diagnosis Date   Anxiety    Asthma    uses inhaler prn   Chest pain    Colon polyps    Diarrhea    with constipation while taking diet pills (Alli)   History of rectal bleeding     2 times in past   HSV-2 (herpes simplex virus 2) infection    Palpitations    in past/due to Advil pm   Vitamin D deficiency    Allergies  Allergen Reactions   Ciprocin-Fluocin-Procin [Fluocinolone Acetonide]     SOB   Sulfa Antibiotics     hives   Past Surgical History:  Procedure Laterality Date   ABDOMINAL HYSTERECTOMY  1998   fibroids. partial   CESAREAN SECTION     1 time   COLONOSCOPY W/ POLYPECTOMY  2017   RESECTOSCOPIC MYOMECTOMY     Family History  Problem Relation Age of Onset   Asthma Mother    Diabetes Father    Hypertension Father    Heart disease Father    Prostate cancer Father    Atrial fibrillation Sister  Thyroid disease Sister    Atrial fibrillation Brother    Diabetes Paternal Uncle    Lung cancer Paternal Grandfather    Colon cancer Neg Hx    Colon polyps Neg Hx    Rectal cancer Neg Hx    Stomach cancer Neg Hx    Esophageal cancer Neg Hx    Social History   Social History Narrative   Married. One child.   College-educated, works as a Copywriter, advertising.   Smoke alarm in the home. Wears her seatbelt.   Feels safe in her relationship.    Allergies as of 09/19/2021       Reactions   Ciprocin-fluocin-procin [fluocinolone Acetonide]    SOB   Sulfa Antibiotics    hives        Medication List        Accurate as of Sep 19, 2021  8:36 AM. If you have any questions, ask your nurse or doctor.           STOP taking these medications    amoxicillin 875 MG tablet Commonly known as: AMOXIL Stopped by: Howard Pouch, DO   metoprolol tartrate 100 MG tablet Commonly known as: LOPRESSOR Stopped by: Howard Pouch, DO       TAKE these medications    Advair HFA 45-21 MCG/ACT inhaler Generic drug: fluticasone-salmeterol Inhale 2 puffs into the lungs 2 (two) times daily.   pantoprazole 40 MG tablet Commonly known as: PROTONIX Take 1 tablet (40 mg total) by mouth daily.   valACYclovir 500 MG tablet Commonly known as: VALTREX Take 1 tablet (500 mg total) by mouth daily.   Vitamin D (Cholecalciferol) 25 MCG (1000 UT) Caps Take 3 capsules by mouth. 3,000 units daily        All past medical history, surgical history, allergies, family history, immunizations andmedications were updated in the EMR today and reviewed under the history and medication portions of their EMR.     Recent Results (from the past 2160 hour(s))  Hepatitis B surface antigen     Status: None   Collection Time: 08/14/21 12:00 AM  Result Value Ref Range   Hepatitis B Surface Ag 6.76     MR KNEE LEFT WO CONTRAST Result Date: 05/27/2021 IMPRESSION: 1.  Evidence of fracture or acute osseous abnormality. 2.  Cruciate and collateral ligaments are intact. 3.  Degenerative changes of the menisci without acute tear. 4.  No significant arthropathy. Electronically Signed   By: Keane Police D.O.   On: 05/27/2021 16:50   ROS 14 pt review of systems performed and negative (unless mentioned in an HPI)  Objective: BP 100/69   Pulse 64   Temp 98.1 F (36.7 C) (Oral)   Ht '5\' 6"'$  (1.676 m)   Wt 214 lb (97.1 kg)   SpO2 98%   BMI 34.54 kg/m  Physical Exam Vitals and nursing note reviewed.  Constitutional:      General: She is not in acute distress.    Appearance: Normal appearance. She is obese. She is not ill-appearing or toxic-appearing.  HENT:     Head: Normocephalic and atraumatic.     Right Ear: Tympanic  membrane, ear canal and external ear normal. There is no impacted cerumen.     Left Ear: Tympanic membrane, ear canal and external ear normal. There is no impacted cerumen.     Nose: No congestion or rhinorrhea.     Mouth/Throat:     Mouth: Mucous membranes are moist.     Pharynx: Oropharynx  is clear. No oropharyngeal exudate or posterior oropharyngeal erythema.  Eyes:     General: No scleral icterus.       Right eye: No discharge.        Left eye: No discharge.     Extraocular Movements: Extraocular movements intact.     Conjunctiva/sclera: Conjunctivae normal.     Pupils: Pupils are equal, round, and reactive to light.  Cardiovascular:     Rate and Rhythm: Normal rate and regular rhythm.     Pulses: Normal pulses.     Heart sounds: Normal heart sounds. No murmur heard.   No friction rub. No gallop.  Pulmonary:     Effort: Pulmonary effort is normal. No respiratory distress.     Breath sounds: Normal breath sounds. No stridor. No wheezing, rhonchi or rales.  Chest:     Chest wall: No tenderness.  Abdominal:     General: Abdomen is flat. Bowel sounds are normal. There is no distension.     Palpations: Abdomen is soft. There is no mass.     Tenderness: There is no abdominal tenderness. There is no right CVA tenderness, left CVA tenderness, guarding or rebound.     Hernia: No hernia is present.  Musculoskeletal:        General: No swelling, tenderness or deformity. Normal range of motion.     Cervical back: Normal range of motion and neck supple. No rigidity or tenderness.     Right lower leg: No edema.     Left lower leg: No edema.  Lymphadenopathy:     Cervical: No cervical adenopathy.  Skin:    General: Skin is warm and dry.     Coloration: Skin is not jaundiced or pale.     Findings: No bruising, erythema, lesion or rash.  Neurological:     General: No focal deficit present.     Mental Status: She is alert and oriented to person, place, and time. Mental status is at  baseline.     Cranial Nerves: No cranial nerve deficit.     Sensory: No sensory deficit.     Motor: No weakness.     Coordination: Coordination normal.     Gait: Gait normal.     Deep Tendon Reflexes: Reflexes normal.  Psychiatric:        Mood and Affect: Mood normal.        Behavior: Behavior normal.        Thought Content: Thought content normal.        Judgment: Judgment normal.      No results found.  Assessment/plan: Sheril Hammond is a 60 y.o. female present for CPE  HSV-2 (herpes simplex virus 2) infection Stable. Continue valtrex.  vitamin D deficiency Continue supplement.  Obesity (BMI 30-39.9) - Lipid panel - gave her instruction via avs on calling her insurance.  Diabetes mellitus screening - Hemoglobin A1c Encounter for long-term current use of medication - valtrex, advair - CBC - Comprehensive metabolic panel Asthma: Stable Continue advair Cbc, cmp Breast cancer screening by mammogram - MM 3D SCREEN BREAST BILATERAL; Future Hep B: Hep B #1 collected today #2 in 5 weeks by nurse visit.  Routine general medical examination at a health care facility Colonoscopy: completed 11/15/2020, by Dr. Henrene Pastor, 10 yr follow up Mammogram: completed: 02/13/2021- [facility] Cervical cancer screening: History of hysterectomy not indicated Immunizations: tdap UTD 08/2020, Influenza (encouraged yearly), covid completed, shingrix completed. Pt reports she needs a booster to her hep B series. She had serology at  her work that resulted with negative immunity. She reports she had the hep b series in the past Infectious disease screening: HIV and hep c completed  DEXA: N/A Patient was encouraged to exercise greater than 150 minutes a week. Patient was encouraged to choose a diet filled with fresh fruits and vegetables, and lean meats. AVS provided to patient today for education/recommendation on gender specific health and safety maintenance.  Return in about 1 year (around 09/21/2022)  for cpe (20 min) and nurse visit 5 weeks for HepB #2.   Orders Placed This Encounter  Procedures   MM 3D SCREEN BREAST BILATERAL   Heplisav-B (HepB-CPG) Vaccine   CBC   Comprehensive metabolic panel   Hemoglobin A1c   Lipid panel   Hepatitis B surface antigen   Vitamin D (25 hydroxy)   No orders of the defined types were placed in this encounter.  Referral Orders  No referral(s) requested today     Electronically signed by: Howard Pouch, [place]

## 2021-09-19 NOTE — Patient Instructions (Addendum)
Return in about 1 year (around 09/21/2022) for cpe (20 min).  Weight loss medication coverage. Body mass index is 34.54 kg/m. Obesity with serious comorbidity.  [place]        Great to see you today.  I have refilled the medication(s) we provide.   If labs were collected, we will inform you of lab results once received either by echart message or telephone call.   - echart message- for normal results that have been seen by the patient already.   - telephone call: abnormal results or if patient has not viewed results in their echart.  Health Maintenance, Female Adopting a healthy lifestyle and getting preventive care are important in promoting health and wellness. Ask your health care provider about: The right schedule for you to have regular tests and exams. Things you can do on your own to prevent diseases and keep yourself healthy. What should I know about diet, weight, and exercise? Eat a healthy diet  Eat a diet that includes plenty of vegetables, fruits, low-fat dairy products, and lean protein. Do not eat a lot of foods that are high in solid fats, added sugars, or sodium. Maintain a healthy weight Body mass index (BMI) is used to identify weight problems. It estimates body fat based on height and weight. Your health care provider can help determine your BMI and help you achieve or maintain a healthy weight. Get regular exercise Get regular exercise. This is one of the most important things you can do for your health. Most adults should: Exercise for at least 150 minutes each week. The exercise should increase your heart rate and make you sweat (moderate-intensity exercise). Do strengthening exercises at least twice a week. This is in addition to the moderate-intensity exercise. Spend less time sitting. Even light physical activity can be beneficial. Watch cholesterol and blood lipids Have your blood tested for lipids and cholesterol at 59 years of age, then have this test  every 5 years. Have your cholesterol levels checked more often if: Your lipid or cholesterol levels are high. You are older than 59 years of age. You are at high risk for heart disease. What should I know about cancer screening? Depending on your health history and family history, you may need to have cancer screening at various ages. This may include screening for: Breast cancer. Cervical cancer. Colorectal cancer. Skin cancer. Lung cancer. What should I know about heart disease, diabetes, and high blood pressure? Blood pressure and heart disease High blood pressure causes heart disease and increases the risk of stroke. This is more likely to develop in people who have high blood pressure readings or are overweight. Have your blood pressure checked: Every 3-5 years if you are 21-72 years of age. Every year if you are 35 years old or older. Diabetes Have regular diabetes screenings. This checks your fasting blood sugar level. Have the screening done: Once every three years after age 83 if you are at a normal weight and have a low risk for diabetes. More often and at a younger age if you are overweight or have a high risk for diabetes. What should I know about preventing infection? Hepatitis B If you have a higher risk for hepatitis B, you should be screened for this virus. Talk with your health care provider to find out if you are at risk for hepatitis B infection. Hepatitis C Testing is recommended for: Everyone born from [birth year] through 1963. Anyone with known risk factors for hepatitis C. Sexually transmitted infections (  STIs) Get screened for STIs, including gonorrhea and chlamydia, if: You are sexually active and are younger than 59 years of age. You are older than 59 years of age and your health care provider tells you that you are at risk for this type of infection. Your sexual activity has changed since you were last screened, and you are at increased risk for chlamydia or  gonorrhea. Ask your health care provider if you are at risk. Ask your health care provider about whether you are at high risk for HIV. Your health care provider may recommend a prescription medicine to help prevent HIV infection. If you choose to take medicine to prevent HIV, you should first get tested for HIV. You should then be tested every 3 months for as long as you are taking the medicine. Pregnancy If you are about to stop having your period (premenopausal) and you may become pregnant, seek counseling before you get pregnant. Take 400 to 800 micrograms (mcg) of folic acid every day if you become pregnant. Ask for birth control (contraception) if you want to prevent pregnancy. Osteoporosis and menopause Osteoporosis is a disease in which the bones lose minerals and strength with aging. This can result in bone fractures. If you are 72 years old or older, or if you are at risk for osteoporosis and fractures, ask your health care provider if you should: Be screened for bone loss. Take a calcium or vitamin D supplement to lower your risk of fractures. Be given hormone replacement therapy (HRT) to treat symptoms of menopause. Follow these instructions at home: Alcohol use Do not drink alcohol if: Your health care provider tells you not to drink. You are pregnant, may be pregnant, or are planning to become pregnant. If you drink alcohol: Limit how much you have to: 0-1 drink a day. Know how much alcohol is in your drink. In the [place] one drink equals one 12 oz bottle of beer (355 mL), one 5 oz glass of wine (148 mL), or one 1 oz glass of hard liquor (44 mL). Lifestyle Do not use any products that contain nicotine or tobacco. These products include cigarettes, chewing tobacco, and vaping devices, such as e-cigarettes. If you need help quitting, ask your health care provider. Do not use street drugs. Do not share needles. Ask your health care provider for help if you need support or  information about quitting drugs. General instructions Schedule regular health, dental, and eye exams. Stay current with your vaccines. Tell your health care provider if: You often feel depressed. You have ever been abused or do not feel safe at home. Summary Adopting a healthy lifestyle and getting preventive care are important in promoting health and wellness. Follow your health care provider's instructions about healthy diet, exercising, and getting tested or screened for diseases. Follow your health care provider's instructions on monitoring your cholesterol and blood pressure. This information is not intended to replace advice given to you by your health care provider. Make sure you discuss any questions you have with your health care provider. Document Revised: 09/02/2020 Document Reviewed: 09/02/2020 Elsevier Patient Education  [place].

## 2021-09-23 ENCOUNTER — Telehealth: Payer: Self-pay | Admitting: Family Medicine

## 2021-09-23 NOTE — Telephone Encounter (Signed)
Please call patient Jade [name], kidney and thyroid function are normal Blood cell counts and electrolytes are normal Diabetes screening/A1c is normal   Cholesterol panel looks okay overall.  Her HDL/good cholesterol is great at 56.8.  Her triglycerides are normal.  Her LDL is just mildly above goal at 142, goal being less than 135.  Her vitamin D is low at 23.  If she is taking 3000 units of vitamin D daily (per EMR), I would encourage her to increase to 4000 units daily.  If she states she has not been taking her vitamin D daily, I would encourage her to take the 3000 units daily.

## 2021-09-23 NOTE — Telephone Encounter (Signed)
LM for pt to return call to discuss.  

## 2021-09-24 NOTE — Telephone Encounter (Signed)
LVM for pt to CB regarding results.  

## 2021-09-25 NOTE — Telephone Encounter (Signed)
Spoke with pt regarding labs and instructions.   

## 2021-09-25 NOTE — Telephone Encounter (Signed)
LVM for pt to CB regarding results.  

## 2021-10-24 ENCOUNTER — Ambulatory Visit (INDEPENDENT_AMBULATORY_CARE_PROVIDER_SITE_OTHER): Payer: 59

## 2021-10-24 DIAGNOSIS — Z23 Encounter for immunization: Secondary | ICD-10-CM

## 2021-12-19 ENCOUNTER — Ambulatory Visit (INDEPENDENT_AMBULATORY_CARE_PROVIDER_SITE_OTHER): Payer: 59 | Admitting: Podiatry

## 2021-12-19 DIAGNOSIS — B351 Tinea unguium: Secondary | ICD-10-CM

## 2021-12-19 DIAGNOSIS — Z79899 Other long term (current) drug therapy: Secondary | ICD-10-CM

## 2021-12-19 NOTE — Progress Notes (Signed)
Subjective:  Patient ID: Jade [name], female    DOB: [birth year],  MRN: [id number]  Chief Complaint  Patient presents with   Toe Pain    61 y.o. female presents with the above complaint.  Patient presents with her right second and third thickened elongated dystrophic mycotic toenails x2 mild pain on palpation.  Patient states a fungus of the breast for quite some time has progressive gotten worse.  Hurts with ambulation a little bit.  She states that she has not tried over-the-counter medication which has not helped she would like to discuss oral medication.  She has not seen anyone else prior to seeing me she denies any other acute issues.   Review of Systems: Negative except as noted in the HPI. Denies N/V/F/Ch.  Past Medical History:  Diagnosis Date   Anxiety    Asthma    uses inhaler prn   Chest pain    Colon polyps    Diarrhea    with constipation while taking diet pills (Alli)   History of rectal bleeding     2 times in past   HSV-2 (herpes simplex virus 2) infection    Palpitations    in past/due to Advil pm   Vitamin D deficiency     Current Outpatient Medications:    fluticasone-salmeterol (ADVAIR HFA) 45-21 MCG/ACT inhaler, Inhale 2 puffs into the lungs 2 (two) times daily., Disp: 1 each, Rfl: 12   pantoprazole (PROTONIX) 40 MG tablet, Take 1 tablet (40 mg total) by mouth daily. (Patient not taking: Reported on 09/19/2021), Disp: 30 tablet, Rfl: 11   valACYclovir (VALTREX) 500 MG tablet, Take 1 tablet (500 mg total) by mouth daily., Disp: 90 tablet, Rfl: 4   Vitamin D, Cholecalciferol, 25 MCG (1000 UT) CAPS, Take 3 capsules by mouth. 3,000 units daily, Disp: , Rfl:   Current Facility-Administered Medications:    0.9 %  sodium chloride infusion, 500 mL, Intravenous, Once, Irene Shipper, MD  Social History   Tobacco Use  Smoking Status Never  Smokeless Tobacco Never    Allergies  Allergen Reactions   Ciprocin-Fluocin-Procin [Fluocinolone Acetonide]     SOB    Sulfa Antibiotics     hives   Objective:  There were no vitals filed for this visit. There is no height or weight on file to calculate BMI. Constitutional Well developed. Well nourished.  Vascular Dorsalis pedis pulses palpable bilaterally. Posterior tibial pulses palpable bilaterally. Capillary refill normal to all digits.  No cyanosis or clubbing noted. Pedal hair growth normal.  Neurologic Normal speech. Oriented to person, place, and time. Epicritic sensation to light touch grossly present bilaterally.  Dermatologic Nails thickened elongated dystrophic mycotic dystrophic painful nail to right second and third digit. Skin within normal limits  Orthopedic: Normal joint ROM without pain or crepitus bilaterally. No visible deformities. No bony tenderness.   Radiographs: None Assessment:   1. Encounter for long-term (current) use of high-risk medication   2. Nail fungus   3. Onychomycosis due to dermatophyte    Plan:  Patient was evaluated and treated and all questions answered.  Right third and fourth digit onychomycosis -Educated the patient on the etiology of onychomycosis and various treatment options associated with improving the fungal load.  I explained to the patient that there is 3 treatment options available to treat the onychomycosis including topical, p.o., laser treatment.  Patient elected to undergo p.o. options with Lamisil/terbinafine therapy.  In order for me to start the medication therapy, I explained to the  patient the importance of evaluating the liver and obtaining the liver function test.  Once the liver function test comes back normal I will start him on 62-monthcourse of Lamisil therapy.  Patient understood all risk and would like to proceed with Lamisil therapy.  I have asked the patient to immediately stop the Lamisil therapy if she has any reactions to it and call the office or go to the emergency room right away.  Patient states understanding   No  follow-ups on file.

## 2022-01-16 ENCOUNTER — Encounter: Payer: Self-pay | Admitting: Family Medicine

## 2022-01-16 ENCOUNTER — Ambulatory Visit: Payer: 59 | Admitting: Family Medicine

## 2022-01-16 VITALS — BP 107/71 | HR 84 | Temp 98.1°F | Ht 66.0 in | Wt 218.0 lb

## 2022-01-16 DIAGNOSIS — R051 Acute cough: Secondary | ICD-10-CM

## 2022-01-16 DIAGNOSIS — J029 Acute pharyngitis, unspecified: Secondary | ICD-10-CM | POA: Diagnosis not present

## 2022-01-16 LAB — POCT INFLUENZA A/B
Influenza A, POC: NEGATIVE
Influenza B, POC: NEGATIVE

## 2022-01-16 LAB — POC COVID19 BINAXNOW: SARS Coronavirus 2 Ag: NEGATIVE

## 2022-01-16 LAB — POCT RAPID STREP A (OFFICE): Rapid Strep A Screen: NEGATIVE

## 2022-01-16 MED ORDER — DOXYCYCLINE HYCLATE 100 MG PO CAPS: 100.0000 mg | ORAL_CAPSULE | Freq: Two times a day (BID) | ORAL | 0 refills | Status: AC

## 2022-01-16 MED ORDER — PREDNISONE 20 MG PO TABS: ORAL_TABLET | ORAL | 0 refills | Status: DC

## 2022-01-16 NOTE — Progress Notes (Signed)
OFFICE VISIT  01/16/2022  CC:  Chief Complaint  Patient presents with   Sore Throat    Pt c/o sore throat, chest congestion, cough, fatigue, sinus pressure x 5 days   Patient is a 61 y.o. female who presents for sore throat.  HPI: Started getting a sore throat 5 days ago.  The sore throat has gradually improved but then yesterday she started developing significant nasal mucus, postnasal drip, and cough.  She denies any wheezing or shortness of breath.  She has not had to use her albuterol rescue at this point. Subjective fever this morning but she did not have a thermometer to check. No body aches but she does feel very tired. Having a couple of loose stools on and off but nothing profound.  No nausea or vomiting.  No rash.   Past Medical History:  Diagnosis Date   Anxiety    Asthma    uses inhaler prn   Chest pain    Colon polyps    Diarrhea    with constipation while taking diet pills (Alli)   History of rectal bleeding     2 times in past   HSV-2 (herpes simplex virus 2) infection    Palpitations    in past/due to Advil pm   Vitamin D deficiency     Past Surgical History:  Procedure Laterality Date   ABDOMINAL HYSTERECTOMY  1998   fibroids. partial   CESAREAN SECTION     1 time   COLONOSCOPY W/ POLYPECTOMY  2017   RESECTOSCOPIC MYOMECTOMY      Outpatient Medications Prior to Visit  Medication Sig Dispense Refill   fluticasone-salmeterol (ADVAIR HFA) 45-21 MCG/ACT inhaler Inhale 2 puffs into the lungs 2 (two) times daily. 1 each 12   pantoprazole (PROTONIX) 40 MG tablet Take 1 tablet (40 mg total) by mouth daily. 30 tablet 11   valACYclovir (VALTREX) 500 MG tablet Take 1 tablet (500 mg total) by mouth daily. 90 tablet 4   Vitamin D, Cholecalciferol, 25 MCG (1000 UT) CAPS Take 3 capsules by mouth. 3,000 units daily     Fluticasone-Umeclidin-Vilant (TRELEGY ELLIPTA) 200-62.5-25 MCG/ACT AEPB INHALE 1 PUFF BY MOUTH EVERY DAY     montelukast (SINGULAIR) 10 MG tablet  SMARTSIG:1 Tablet(s) By Mouth Every Evening     Facility-Administered Medications Prior to Visit  Medication Dose Route Frequency Provider Last Rate Last Admin   0.9 %  sodium chloride infusion  500 mL Intravenous Once Irene Shipper, MD        Allergies  Allergen Reactions   Ciprocin-Fluocin-Procin [Fluocinolone Acetonide]     SOB   Sulfa Antibiotics     hives    ROS As per HPI  PE:    01/16/2022    1:04 PM 09/19/2021    8:05 AM 08/19/2021    4:15 PM  Vitals with BMI  Height '5\' 6"'  '5\' 6"'  '5\' 6"'   Weight 218 lbs 214 lbs 214 lbs  BMI 35.2 13.24 40.10  Systolic 272 536 644  Diastolic 71 69 71  Pulse 84 64 77     Physical Exam  VS: noted--normal. Gen: alert, NAD, NONTOXIC BUT TIRED -APPEARING. HEENT: eyes without injection, drainage, or swelling.  Ears: EACs clear, TMs with normal light reflex and landmarks.  Nose: Clear rhinorrhea, with some dried, crusty exudate adherent to mildly injected mucosa.  No purulent d/c.  No paranasal sinus TTP.  No facial swelling.  Throat and mouth without focal lesion.  No pharyngial swelling, erythema, or  exudate.   Neck: supple, no LAD.   LUNGS: CTA bilat, nonlabored resps.   CV: RRR, no m/r/g. EXT: no c/c/e SKIN: no rash   LABS:  Last CBC Lab Results  Component Value Date   WBC 6.4 09/19/2021   HGB 13.6 09/19/2021   HCT 40.8 09/19/2021   MCV 86.5 09/19/2021   MCH 29.3 07/19/2020   RDW 13.6 09/19/2021   PLT 209.0 19/91/4445   Last metabolic panel Lab Results  Component Value Date   GLUCOSE 100 (H) 09/19/2021   NA 140 09/19/2021   K 3.9 09/19/2021   CL 105 09/19/2021   CO2 25 09/19/2021   BUN 19 09/19/2021   CREATININE 0.67 09/19/2021   EGFR 103 12/06/2020   CALCIUM 9.3 09/19/2021   PROT 6.9 09/19/2021   ALBUMIN 4.2 09/19/2021   BILITOT 0.7 09/19/2021   ALKPHOS 60 09/19/2021   AST 17 09/19/2021   ALT 13 09/19/2021   IMPRESSION AND PLAN:  Prolonged URI, cough worsening. Patient has asthma but at this point does not  appear to be in an acute flare. Rapid strep today:->neg Rapid flu today->neg Rapid covid today->neg.  Scribed doxycycline 100 mg twice daily x7 days. Additionally, I gave a prescription for prednisone 40 mg x 5 days for her to fill and have on hand if she should start feeling an asthma flare come with this current illness.  An After Visit Summary was printed and given to the patient.  FOLLOW UP: Return in about 3 months (around 04/17/2022).  Signed:  Crissie Sickles, MD           01/16/2022

## 2022-01-16 NOTE — Patient Instructions (Signed)
Use otc mucinex dm as directed on packaging

## 2022-02-12 ENCOUNTER — Telehealth: Payer: Self-pay | Admitting: Family Medicine

## 2022-02-12 NOTE — Telephone Encounter (Signed)
Pt is requesting a refill for valacyclovir. Pharmacy has been confirmed.

## 2022-02-13 MED ORDER — VALACYCLOVIR HCL 500 MG PO TABS: 500.0000 mg | ORAL_TABLET | Freq: Every day | ORAL | 1 refills | Status: DC

## 2022-04-28 ENCOUNTER — Telehealth: Payer: Self-pay | Admitting: Family Medicine

## 2022-04-28 NOTE — Telephone Encounter (Signed)
Transition Care Management Unsuccessful Follow-up Telephone Call  Date of discharge and from where:  04/27/2022-[facility]   Attempts:  1st Attempt  Reason for unsuccessful TCM follow-up call:  Left voice message

## 2022-06-10 ENCOUNTER — Other Ambulatory Visit: Payer: Self-pay | Admitting: Family Medicine

## 2022-06-10 DIAGNOSIS — Z1231 Encounter for screening mammogram for malignant neoplasm of breast: Secondary | ICD-10-CM

## 2022-06-18 ENCOUNTER — Ambulatory Visit: Payer: BC Managed Care – PPO | Admitting: Podiatry

## 2022-06-18 DIAGNOSIS — M6740 Ganglion, unspecified site: Secondary | ICD-10-CM

## 2022-06-18 DIAGNOSIS — Z01818 Encounter for other preprocedural examination: Secondary | ICD-10-CM

## 2022-06-18 NOTE — Progress Notes (Signed)
Subjective:  Patient ID: Jade [name], female    DOB: Mar 11, 1961,  MRN: [id number]  Chief Complaint  Patient presents with   Toe Pain    62 y.o. female presents with the above complaint.  Second digit mucoid cystPatient presents with right.  Patient states that it has been painful has comes out of nowhere and goes this as it pleases.  She states the pain scale is 5 out of 10 hurts with rubbing and pressure hurts with ambulation she has tried plenty of conservative care none of which has helped.  She would like to discuss surgical options to excise it.   Review of Systems: Negative except as noted in the HPI. Denies N/V/F/Ch.  Past Medical History:  Diagnosis Date   Anxiety    Asthma    uses inhaler prn   Chest pain    Colon polyps    Diarrhea    with constipation while taking diet pills (Alli)   History of rectal bleeding     2 times in past   HSV-2 (herpes simplex virus 2) infection    Palpitations    in past/due to Advil pm   Vitamin D deficiency     Current Outpatient Medications:    fluticasone-salmeterol (ADVAIR HFA) 45-21 MCG/ACT inhaler, Inhale 2 puffs into the lungs 2 (two) times daily., Disp: 1 each, Rfl: 12   Fluticasone-Umeclidin-Vilant (TRELEGY ELLIPTA) 200-62.5-25 MCG/ACT AEPB, INHALE 1 PUFF BY MOUTH EVERY DAY, Disp: , Rfl:    montelukast (SINGULAIR) 10 MG tablet, SMARTSIG:1 Tablet(s) By Mouth Every Evening, Disp: , Rfl:    pantoprazole (PROTONIX) 40 MG tablet, Take 1 tablet (40 mg total) by mouth daily., Disp: 30 tablet, Rfl: 11   predniSONE (DELTASONE) 20 MG tablet, 2 tabs po qd x 5d, Disp: 10 tablet, Rfl: 0   valACYclovir (VALTREX) 500 MG tablet, Take 1 tablet (500 mg total) by mouth daily., Disp: 90 tablet, Rfl: 1   Vitamin D, Cholecalciferol, 25 MCG (1000 UT) CAPS, Take 3 capsules by mouth. 3,000 units daily, Disp: , Rfl:   Current Facility-Administered Medications:    0.9 %  sodium chloride infusion, 500 mL, Intravenous, Once, Irene Shipper, MD  Social  History   Tobacco Use  Smoking Status Never  Smokeless Tobacco Never    Allergies  Allergen Reactions   Ciprocin-Fluocin-Procin [Fluocinolone Acetonide]     SOB   Sulfa Antibiotics     hives   Objective:  There were no vitals filed for this visit. There is no height or weight on file to calculate BMI. Constitutional Well developed. Well nourished.  Vascular Dorsalis pedis pulses palpable bilaterally. Posterior tibial pulses palpable bilaterally. Capillary refill normal to all digits.  No cyanosis or clubbing noted. Pedal hair growth normal.  Neurologic Normal speech. Oriented to person, place, and time. Epicritic sensation to light touch grossly present bilaterally.  Dermatologic Right second digit mucoid cyst noted at the DIPJ joint mild pain on palpation.  Unable to decompress any fluid.  Orthopedic: Normal joint ROM without pain or crepitus bilaterally. No visible deformities. No bony tenderness.   Radiographs: None Assessment:   1. Mucoid cyst of joint   2. Encounter for preoperative examination for general surgical procedure    Plan:  Patient was evaluated and treated and all questions answered.  Right second digit mucoid cyst DIPJ -All questions and concerns were discussed with the patient in extensive detail.  Given the amount of cyst that is present in setting of failing all conservative care patient will  benefit from surgical excision of the mucoid cyst.  I discussed with the patient my preoperative intra postop plan in extensive detail she states understanding and would like to proceed with surgery in the office -To be scheduled for office surgery -Informed surgical risk consent was reviewed and read aloud to the patient.  I reviewed the films.  I have discussed my findings with the patient in great detail.  I have discussed all risks including but not limited to infection, stiffness, scarring, limp, disability, deformity, damage to blood vessels and nerves,  numbness, poor healing, need for braces, arthritis, chronic pain, amputation, death.  All benefits and realistic expectations discussed in great detail.  I have made no promises as to the outcome.  I have provided realistic expectations.  I have offered the patient a 2nd opinion, which they have declined and assured me they preferred to proceed despite the risks   No follow-ups on file.

## 2022-06-19 ENCOUNTER — Telehealth: Payer: Self-pay | Admitting: Urology

## 2022-06-19 NOTE — Telephone Encounter (Signed)
OFFICE DOS - 06/25/22  EXC MUCOID CYST --- 28090  BC[employer] EFFECTIVE DATE - 05/28/22  SPOKE WITH LADONNA WITH [employer] STATE, SHE STATED THAT FOR CPT CODE 42595 NO PRIOR AUTH IS REQUIRED.  REF # LADONNA M. 06/19/22 AT 8:31 AM EST    PT CALLED AND STATED THAT SHE WOULD LIKE TO CANCEL HER SX DUE TO WORK SCHEDULE. PT STATED THAT SHE WOULD CALL BACK TO RESCHEDULE. I HAVE INFORMED DR. PATEL OF THIS CHANGE.

## 2022-06-25 ENCOUNTER — Ambulatory Visit: Payer: BC Managed Care – PPO | Admitting: Podiatry

## 2022-07-02 ENCOUNTER — Encounter: Payer: BC Managed Care – PPO | Admitting: Podiatry

## 2022-07-10 DIAGNOSIS — Z1231 Encounter for screening mammogram for malignant neoplasm of breast: Secondary | ICD-10-CM | POA: Diagnosis not present

## 2022-07-10 DIAGNOSIS — Z01419 Encounter for gynecological examination (general) (routine) without abnormal findings: Secondary | ICD-10-CM | POA: Diagnosis not present

## 2022-07-10 LAB — HM MAMMOGRAPHY

## 2022-07-16 ENCOUNTER — Ambulatory Visit: Payer: BC Managed Care – PPO

## 2022-07-31 DIAGNOSIS — H25041 Posterior subcapsular polar age-related cataract, right eye: Secondary | ICD-10-CM | POA: Diagnosis not present

## 2022-08-18 DIAGNOSIS — L03011 Cellulitis of right finger: Secondary | ICD-10-CM | POA: Diagnosis not present

## 2022-10-09 ENCOUNTER — Encounter: Payer: Self-pay | Admitting: Family Medicine

## 2022-10-09 ENCOUNTER — Ambulatory Visit (INDEPENDENT_AMBULATORY_CARE_PROVIDER_SITE_OTHER): Payer: BC Managed Care – PPO | Admitting: Family Medicine

## 2022-10-09 VITALS — BP 114/79 | HR 68 | Temp 98.4°F | Ht 66.0 in | Wt 210.6 lb

## 2022-10-09 DIAGNOSIS — Z13220 Encounter for screening for lipoid disorders: Secondary | ICD-10-CM

## 2022-10-09 DIAGNOSIS — B009 Herpesviral infection, unspecified: Secondary | ICD-10-CM | POA: Diagnosis not present

## 2022-10-09 DIAGNOSIS — Z131 Encounter for screening for diabetes mellitus: Secondary | ICD-10-CM | POA: Diagnosis not present

## 2022-10-09 DIAGNOSIS — Z0000 Encounter for general adult medical examination without abnormal findings: Secondary | ICD-10-CM | POA: Diagnosis not present

## 2022-10-09 DIAGNOSIS — Z8639 Personal history of other endocrine, nutritional and metabolic disease: Secondary | ICD-10-CM | POA: Diagnosis not present

## 2022-10-09 LAB — TSH: TSH: 1.94 u[IU]/mL (ref 0.35–5.50)

## 2022-10-09 LAB — CBC
HCT: 42.4 % (ref 36.0–46.0)
Hemoglobin: 13.8 g/dL (ref 12.0–15.0)
MCHC: 32.7 g/dL (ref 30.0–36.0)
MCV: 87.5 fl (ref 78.0–100.0)
Platelets: 243 10*3/uL (ref 150.0–400.0)
RBC: 4.85 Mil/uL (ref 3.87–5.11)
RDW: 14.4 % (ref 11.5–15.5)
WBC: 6.5 10*3/uL (ref 4.0–10.5)

## 2022-10-09 LAB — HEMOGLOBIN A1C: Hgb A1c MFr Bld: 5.9 % (ref 4.6–6.5)

## 2022-10-09 LAB — COMPREHENSIVE METABOLIC PANEL
ALT: 11 U/L (ref 0–35)
AST: 16 U/L (ref 0–37)
Albumin: 4.1 g/dL (ref 3.5–5.2)
Alkaline Phosphatase: 59 U/L (ref 39–117)
BUN: 14 mg/dL (ref 6–23)
CO2: 25 mEq/L (ref 19–32)
Calcium: 9.3 mg/dL (ref 8.4–10.5)
Chloride: 105 mEq/L (ref 96–112)
Creatinine, Ser: 0.63 mg/dL (ref 0.40–1.20)
GFR: 96.78 mL/min (ref >60.00)
Glucose, Bld: 102 mg/dL — ABNORMAL HIGH (ref 70–99)
Potassium: 4 mEq/L (ref 3.5–5.1)
Sodium: 140 mEq/L (ref 135–145)
Total Bilirubin: 0.6 mg/dL (ref 0.2–1.2)
Total Protein: 7 g/dL (ref 6.0–8.3)

## 2022-10-09 LAB — LIPID PANEL
Cholesterol: 202 mg/dL — ABNORMAL HIGH (ref 0–200)
HDL: 59.6 mg/dL (ref >39.00)
LDL Cholesterol: 123 mg/dL — ABNORMAL HIGH (ref 0–99)
NonHDL: 142.41
Total CHOL/HDL Ratio: 3
Triglycerides: 95 mg/dL (ref 0.0–149.0)
VLDL: 19 mg/dL (ref 0.0–40.0)

## 2022-10-09 LAB — VITAMIN D 25 HYDROXY (VIT D DEFICIENCY, FRACTURES): VITD: 22.02 ng/mL — ABNORMAL LOW (ref 30.00–100.00)

## 2022-10-09 MED ORDER — VALACYCLOVIR HCL 500 MG PO TABS: 500.0000 mg | ORAL_TABLET | Freq: Every day | ORAL | 3 refills | Status: DC

## 2022-10-09 NOTE — Progress Notes (Signed)
Patient ID: Jade [name], female  DOB: 22-Feb-1961, 62 y.o.   MRN: [id number] Patient Care Team    Relationship Specialty Notifications Start End  Natalia Leatherwood, DO PCP - General Family Medicine  05/14/17   Hilarie Fredrickson, MD Consulting Physician Gastroenterology  05/14/17   Felicie Morn, OD Referring Physician Optometry  05/14/17   Coralyn Mark, MD Consulting Physician Allergy  05/14/17     Chief Complaint  Patient presents with   Annual Exam    Pt is fasting    Subjective: Jade [name] is a 62 y.o.  Female  present for CPE  All past medical history, surgical history, allergies, family history, immunizations, medications and social history were updated in the electronic medical record today. All recent labs, ED visits and hospitalizations within the last year were reviewed.  Health maintenance:  Colonoscopy: completed 11/15/2020, by Dr. Marina Goodell, 10 yr follow up Mammogram: completed: 07/10/2022- [facility] Cervical cancer screening: History of hysterectomy not indicated Immunizations: tdap UTD 08/2020, Influenza (encouraged yearly),shingrix completed.  Infectious disease screening: HIV and hep c completed  DEXA: routine screen Assistive device: none Oxygen JYN:[facility] Patient has a Dental home. Hospitalizations/ED visits: reviewed     09/19/2021    8:09 AM 08/19/2021    4:14 PM 09/13/2020   10:49 AM 07/19/2020    2:37 PM 05/14/2017    9:03 AM  Depression screen PHQ 2/9  Decreased Interest 0 0 0 0 0  Down, Depressed, Hopeless 0 0 0 0 0  PHQ - 2 Score 0 0 0 0 0      05/14/2017    9:19 AM  GAD 7 : Generalized Anxiety Score  Nervous, Anxious, on Edge 0  Control/stop worrying 0  Worry too much - different things 0  Trouble relaxing 0  Restless 0  Easily annoyed or irritable 0  Afraid - awful might happen 0  Total GAD 7 Score 0    Immunization History  Administered Date(s) Administered   Hepb-cpg 09/19/2021, 10/24/2021   Influenza,inj,Quad PF,6+ Mos 03/31/2019    Influenza-Unspecified 01/25/2017, 01/25/2021   Moderna Sars-Covid-2 Vaccination 04/18/2019, 05/12/2019   Tdap 09/13/2020   Zoster Recombinat (Shingrix) 09/13/2020, 12/20/2020   Past Medical History:  Diagnosis Date   Anxiety    Asthma    uses inhaler prn   Chest pain    Colon polyps    Diarrhea    with constipation while taking diet pills (Alli)   History of rectal bleeding     2 times in past   HSV-2 (herpes simplex virus 2) infection    Palpitations    in past/due to Advil pm   Vitamin D deficiency    Allergies  Allergen Reactions   Ciprocin-Fluocin-Procin [Fluocinolone Acetonide]     SOB   Sulfa Antibiotics     hives   Past Surgical History:  Procedure Laterality Date   ABDOMINAL HYSTERECTOMY  1998   fibroids. partial   CESAREAN SECTION     1 time   COLONOSCOPY W/ POLYPECTOMY  2017   RESECTOSCOPIC MYOMECTOMY     Family History  Problem Relation Age of Onset   Asthma Mother    Diabetes Father    Hypertension Father    Heart disease Father    Prostate cancer Father    Atrial fibrillation Sister    Thyroid disease Sister    Atrial fibrillation Brother    Diabetes Paternal Uncle    Lung cancer Paternal Grandfather    Colon cancer Neg Hx  Colon polyps Neg Hx    Rectal cancer Neg Hx    Stomach cancer Neg Hx    Esophageal cancer Neg Hx    Social History   Social History Narrative   Married. One child.   College-educated, works as a Armed forces operational officer.   Smoke alarm in the home. Wears her seatbelt.   Feels safe in her relationship.    Allergies as of 10/09/2022       Reactions   Ciprocin-fluocin-procin [fluocinolone Acetonide]    SOB   Sulfa Antibiotics    hives        Medication List        Accurate as of October 09, 2022  8:16 AM. If you have any questions, ask your nurse or doctor.          STOP taking these medications    Advair HFA 45-21 MCG/ACT inhaler Generic drug: fluticasone-salmeterol Stopped by: Felix Pacini, DO    pantoprazole 40 MG tablet Commonly known as: PROTONIX Stopped by: Felix Pacini, DO   predniSONE 20 MG tablet Commonly known as: DELTASONE Stopped by: Felix Pacini, DO       TAKE these medications    Airsupra 90-80 MCG/ACT Aero Generic drug: Albuterol-Budesonide SMARTSIG:2 Puff(s) By Mouth Every 2 Hours PRN   montelukast 10 MG tablet Commonly known as: SINGULAIR SMARTSIG:1 Tablet(s) By Mouth Every Evening   Trelegy Ellipta 200-62.5-25 MCG/ACT Aepb Generic drug: Fluticasone-Umeclidin-Vilant INHALE 1 PUFF BY MOUTH EVERY DAY   valACYclovir 500 MG tablet Commonly known as: VALTREX Take 1 tablet (500 mg total) by mouth daily.   Vitamin D (Cholecalciferol) 25 MCG (1000 UT) Caps Take 3 capsules by mouth. 3,000 units daily        All past medical history, surgical history, allergies, family history, immunizations andmedications were updated in the EMR today and reviewed under the history and medication portions of their EMR.     No results found for this or any previous visit (from the past 2160 hour(s)).   MR KNEE LEFT WO CONTRAST Result Date: 05/27/2021 IMPRESSION: 1.  Evidence of fracture or acute osseous abnormality. 2.  Cruciate and collateral ligaments are intact. 3.  Degenerative changes of the menisci without acute tear. 4.  No significant arthropathy. Electronically Signed   By: Larose Hires D.O.   On: 05/27/2021 16:50   ROS 14 pt review of systems performed and negative (unless mentioned in an HPI)  Objective: BP 114/79   Pulse 68   Temp 98.4 F (36.9 C)   Ht 5\' 6"  (1.676 m)   Wt 210 lb 9.6 oz (95.5 kg)   SpO2 96%   BMI 33.99 kg/m  Physical Exam Vitals and nursing note reviewed.  Constitutional:      General: She is not in acute distress.    Appearance: Normal appearance. She is obese. She is not ill-appearing or toxic-appearing.  HENT:     Head: Normocephalic and atraumatic.     Right Ear: Tympanic membrane, ear canal and external ear normal. There  is no impacted cerumen.     Left Ear: Tympanic membrane, ear canal and external ear normal. There is no impacted cerumen.     Nose: No congestion or rhinorrhea.     Mouth/Throat:     Mouth: Mucous membranes are moist.     Pharynx: Oropharynx is clear. No oropharyngeal exudate or posterior oropharyngeal erythema.  Eyes:     General: No scleral icterus.       Right eye: No discharge.  Left eye: No discharge.     Extraocular Movements: Extraocular movements intact.     Conjunctiva/sclera: Conjunctivae normal.     Pupils: Pupils are equal, round, and reactive to light.  Cardiovascular:     Rate and Rhythm: Normal rate and regular rhythm.     Pulses: Normal pulses.     Heart sounds: Normal heart sounds. No murmur heard.    No friction rub. No gallop.  Pulmonary:     Effort: Pulmonary effort is normal. No respiratory distress.     Breath sounds: Normal breath sounds. No stridor. No wheezing, rhonchi or rales.  Chest:     Chest wall: No tenderness.  Abdominal:     General: Abdomen is flat. Bowel sounds are normal. There is no distension.     Palpations: Abdomen is soft. There is no mass.     Tenderness: There is no abdominal tenderness. There is no right CVA tenderness, left CVA tenderness, guarding or rebound.     Hernia: No hernia is present.  Musculoskeletal:        General: No swelling, tenderness or deformity. Normal range of motion.     Cervical back: Normal range of motion and neck supple. No rigidity or tenderness.     Right lower leg: No edema.     Left lower leg: No edema.  Lymphadenopathy:     Cervical: No cervical adenopathy.  Skin:    General: Skin is warm and dry.     Coloration: Skin is not jaundiced or pale.     Findings: No bruising, erythema, lesion or rash.  Neurological:     General: No focal deficit present.     Mental Status: She is alert and oriented to person, place, and time. Mental status is at baseline.     Cranial Nerves: No cranial nerve deficit.      Sensory: No sensory deficit.     Motor: No weakness.     Coordination: Coordination normal.     Gait: Gait normal.     Deep Tendon Reflexes: Reflexes normal.  Psychiatric:        Mood and Affect: Mood normal.        Behavior: Behavior normal.        Thought Content: Thought content normal.        Judgment: Judgment normal.       No results found.  Assessment/plan: Jade [name] is a 62 y.o. female present for CPE  HSV-2 (herpes simplex virus 2) infection Stable Continue valtrex.  Routine general medical examination at a health care facility Patient was encouraged to exercise greater than 150 minutes a week. Patient was encouraged to choose a diet filled with fresh fruits and vegetables, and lean meats. AVS provided to patient today for education/recommendation on gender specific health and safety maintenance. Colonoscopy: completed 11/15/2020, by Dr. Marina Goodell, 10 yr follow up Mammogram: completed: 07/10/2022- [facility] Cervical cancer screening: History of hysterectomy not indicated Immunizations: tdap UTD 08/2020, Influenza (encouraged yearly),shingrix completed.  Infectious disease screening: HIV and hep c completed  DEXA: routine screen    Return in about 1 year (around 10/10/2023) for cpe (20 min).   Orders Placed This Encounter  Procedures   Comprehensive metabolic panel   Hemoglobin A1c   Lipid panel   TSH   CBC   Vitamin D (25 hydroxy)   Meds ordered this encounter  Medications   valACYclovir (VALTREX) 500 MG tablet    Sig: Take 1 tablet (500 mg total) by mouth daily.  Dispense:  90 tablet    Refill:  3   Referral Orders  No referral(s) requested today     Electronically signed by: Felix Pacini, DO [facility] [place]

## 2022-10-09 NOTE — Patient Instructions (Addendum)
Return in about 1 year (around 10/10/2023) for cpe (20 min).        Great to see you today.  I have refilled the medication(s) we provide.   If labs were collected, we will inform you of lab results once received either by echart message or telephone call.   - echart message- for normal results that have been seen by the patient already.   - telephone call: abnormal results or if patient has not viewed results in their echart.

## 2022-10-14 DIAGNOSIS — Z129 Encounter for screening for malignant neoplasm, site unspecified: Secondary | ICD-10-CM | POA: Diagnosis not present

## 2022-10-14 DIAGNOSIS — D2372 Other benign neoplasm of skin of left lower limb, including hip: Secondary | ICD-10-CM | POA: Diagnosis not present

## 2022-10-14 DIAGNOSIS — D2221 Melanocytic nevi of right ear and external auricular canal: Secondary | ICD-10-CM | POA: Diagnosis not present

## 2022-11-10 DIAGNOSIS — S0121XA Laceration without foreign body of nose, initial encounter: Secondary | ICD-10-CM | POA: Diagnosis not present

## 2022-11-10 DIAGNOSIS — X58XXXA Exposure to other specified factors, initial encounter: Secondary | ICD-10-CM | POA: Diagnosis not present

## 2022-11-10 DIAGNOSIS — S0181XA Laceration without foreign body of other part of head, initial encounter: Secondary | ICD-10-CM | POA: Diagnosis not present

## 2022-11-11 ENCOUNTER — Ambulatory Visit: Payer: BC Managed Care – PPO | Admitting: Family Medicine

## 2022-11-17 DIAGNOSIS — S0181XA Laceration without foreign body of other part of head, initial encounter: Secondary | ICD-10-CM | POA: Diagnosis not present

## 2022-12-10 DIAGNOSIS — S43402A Unspecified sprain of left shoulder joint, initial encounter: Secondary | ICD-10-CM | POA: Diagnosis not present

## 2022-12-29 DIAGNOSIS — S0181XD Laceration without foreign body of other part of head, subsequent encounter: Secondary | ICD-10-CM | POA: Diagnosis not present

## 2023-01-04 ENCOUNTER — Ambulatory Visit: Payer: BC Managed Care – PPO | Attending: Cardiovascular Disease | Admitting: Cardiovascular Disease

## 2023-01-04 ENCOUNTER — Encounter: Payer: Self-pay | Admitting: Cardiovascular Disease

## 2023-01-04 ENCOUNTER — Ambulatory Visit (INDEPENDENT_AMBULATORY_CARE_PROVIDER_SITE_OTHER): Payer: BC Managed Care – PPO

## 2023-01-04 ENCOUNTER — Telehealth: Payer: Self-pay | Admitting: Cardiovascular Disease

## 2023-01-04 VITALS — BP 116/70 | HR 73 | Ht 67.0 in | Wt 215.2 lb

## 2023-01-04 DIAGNOSIS — R072 Precordial pain: Secondary | ICD-10-CM

## 2023-01-04 DIAGNOSIS — Z01812 Encounter for preprocedural laboratory examination: Secondary | ICD-10-CM

## 2023-01-04 DIAGNOSIS — R079 Chest pain, unspecified: Secondary | ICD-10-CM | POA: Diagnosis not present

## 2023-01-04 DIAGNOSIS — R002 Palpitations: Secondary | ICD-10-CM

## 2023-01-04 DIAGNOSIS — Z8639 Personal history of other endocrine, nutritional and metabolic disease: Secondary | ICD-10-CM | POA: Diagnosis not present

## 2023-01-04 MED ORDER — METOPROLOL TARTRATE 100 MG PO TABS: ORAL_TABLET | ORAL | 0 refills | Status: DC

## 2023-01-04 NOTE — Telephone Encounter (Signed)
   Pt c/o of Chest Pain: STAT if active CP, including tightness, pressure, jaw pain, radiating pain to shoulder/upper arm/back, CP unrelieved by Nitro. Symptoms reported of SOB, nausea, vomiting, sweating.  1. Are you having CP right now?   No  2. Are you experiencing any other symptoms (ex. SOB, nausea, vomiting, sweating)?   SOB  3. Is your CP continuous or coming and going?  Coming and going  4. Have you taken Nitroglycerin?   No   5. How long have you been experiencing CP?   Off and on for about 2 weeks    6. If NO CP at time of call then end call with telling Pt to call back or call 911 if Chest pain returns prior to return call from triage team.   Husband stated patient's chest pressure eased up overnight.  Husband wants to know next steps.

## 2023-01-04 NOTE — Telephone Encounter (Signed)
Returned call to pt husband Reuel Boom. Pt has been having some shortness of breath for 2 weeks that is coming and going. Pt thinks she fainted. Her pulse ox last night was "normal" and her pulse was 39 at 3 am. Pt does not take her BP. Her BP was "low" last week. Pt was having chest pressure last night and it "eased off" and then it came back 3 am. No arm pain, no headache or any other symptoms. She has chest pressure each time she has the SOB. She has not been seen in 2 years. Her brother and sister both have AFIB. She is not currently having any symptoms. Husband would like to know the next step to take. Did advise him she needs an appointment since it has been 2 year.

## 2023-01-04 NOTE — Patient Instructions (Addendum)
Medication Instructions:  Your physician recommends that you continue on your current medications as directed. Please refer to the Current Medication list given to you today.  *If you need a refill on your cardiac medications before your next appointment, please call your pharmacy*   Lab Work: Your physician recommends that you return for lab work in: BMET, Vit D If you have labs (blood work) drawn today and your tests are completely normal, you will receive your results only by: MyChart Message (if you have MyChart) OR A paper copy in the mail If you have any lab test that is abnormal or we need to change your treatment, we will call you to review the results.   Testing/Procedures:   Your cardiac CT will be scheduled at one of the below locations:   [facility] [address] [place], [place] [address] [phone]   If scheduled at [facility], please arrive at the [facility] and Children's Entrance (Entrance C2) of [facility] 30 minutes prior to test start time. You can use the FREE valet parking offered at entrance C (encouraged to control the heart rate for the test)  Proceed to the [facility] Radiology Department (first floor) to check-in and test prep.  All radiology patients and guests should use entrance C2 at [facility], accessed from [facility], even though the hospital's physical address listed is [address].    There is spacious parking and easy access to the radiology department from the [facility] Heart and Vascular entrance. Please enter here and check-in with the desk attendant.   Please follow these instructions carefully (unless otherwise directed):  On the Night Before the Test: Be sure to Drink plenty of water. Do not consume any caffeinated/decaffeinated beverages or chocolate 12 hours prior to your test. Do not take any antihistamines 12 hours prior to your test.  On the Day of the Test: Drink  plenty of water until 1 hour prior to the test. Do not eat any food 1 hour prior to test. You may take your regular medications prior to the test.  Take metoprolol (Lopressor) two hours prior to test. If you take Furosemide/Hydrochlorothiazide/Spironolactone, please HOLD on the morning of the test. FEMALES- please wear underwire-free bra if available, avoid dresses & tight clothing       After the Test: Drink plenty of water. After receiving IV contrast, you may experience a mild flushed feeling. This is normal. On occasion, you may experience a mild rash up to 24 hours after the test. This is not dangerous. If this occurs, you can take Benadryl 25 mg and increase your fluid intake. If you experience trouble breathing, this can be serious. If it is severe call 911 IMMEDIATELY. If it is mild, please call our office. If you take any of these medications: Glipizide/Metformin, Avandament, Glucavance, please do not take 48 hours after completing test unless otherwise instructed.  We will call to schedule your test 2-4 weeks out understanding that some insurance companies will need an authorization prior to the service being performed.   For more information and frequently asked questions, please visit our website : [email or web address]  For non-scheduling related questions, please contact the cardiac imaging nurse navigator should you have any questions/concerns: Cardiac Imaging Nurse Navigators Direct Office Dial: [phone]   For scheduling needs, including cancellations and rescheduling, please call [place], [phone].  ZIO XT- Long Term Monitor Instructions  Your physician has requested you wear a ZIO patch monitor for  14 days.  This is a single patch monitor. [employer] supplies one patch monitor per enrollment. Additional stickers are not available. Please do not apply patch if you will be having a Nuclear Stress Test,  Echocardiogram, Cardiac CT, MRI, or Chest Xray  during the period you would be wearing the  monitor. The patch cannot be worn during these tests. You cannot remove and re-apply the  ZIO XT patch monitor.  Your ZIO patch monitor will be mailed 3 day USPS to your address on file. It may take 3-5 days  to receive your monitor after you have been enrolled.  Once you have received your monitor, please review the enclosed instructions. Your monitor  has already been registered assigning a specific monitor serial # to you.  Billing and Patient Assistance Program Information  We have supplied [employer] with any of your insurance information on file for billing purposes. [employer] offers a sliding scale Patient Assistance Program for patients that do not have  insurance, or whose insurance does not completely cover the cost of the ZIO monitor.  You must apply for the Patient Assistance Program to qualify for this discounted rate.  To apply, please call [employer] at [phone], select option 4, select option 2, ask to apply for  Patient Assistance Program. Meredeth Ide will ask your household income, and how many people  are in your household. They will quote your out-of-pocket cost based on that information.  [employer] will also be able to set up a 15-month, interest-free payment plan if needed.  Applying the monitor   Shave hair from upper left chest.  Hold abrader disc by orange tab. Rub abrader in 40 strokes over the upper left chest as  indicated in your monitor instructions.  Clean area with 4 enclosed alcohol pads. Let dry.  Apply patch as indicated in monitor instructions. Patch will be placed under collarbone on left  side of chest with arrow pointing upward.  Rub patch adhesive wings for 2 minutes. Remove white label marked "1". Remove the white  label marked "2". Rub patch adhesive wings for 2 additional minutes.  While looking in a mirror, press and release button in center of patch. A small green light will  flash 3-4 times. This will be  your only indicator that the monitor has been turned on.  Do not shower for the first 24 hours. You may shower after the first 24 hours.  Press the button if you feel a symptom. You will hear a small click. Record Date, Time and  Symptom in the Patient Logbook.  When you are ready to remove the patch, follow instructions on the last 2 pages of Patient  Logbook. Stick patch monitor onto the last page of Patient Logbook.  Place Patient Logbook in the blue and white box. Use locking tab on box and tape box closed  securely. The blue and white box has prepaid postage on it. Please place it in the mailbox as  soon as possible. Your physician should have your test results approximately 7 days after the  monitor has been mailed back to [facility].  Call [facility] Customer Care at [phone] if you have questions regarding  your ZIO XT patch monitor. Call them immediately if you see an orange light blinking on your  monitor.  If your monitor falls off in less than 4 days, contact our Monitor department at [phone].  If your monitor becomes loose or falls off after 4 days call [employer] at [phone] for  suggestions on  securing your monitor   Follow-Up: At [facility], you and your health needs are our priority.  As part of our continuing mission to provide you with exceptional heart care, we have created designated Provider Care Teams.  These Care Teams include your primary Cardiologist (physician) and Advanced Practice Providers (APPs -  Physician Assistants and Nurse Practitioners) who all work together to provide you with the care you need, when you need it.  Your next appointment:   3 month(s)  Provider:   Reatha Harps, MD

## 2023-01-04 NOTE — Progress Notes (Signed)
Cardiology Office Note:   Date:  01/04/2023  NAME:  Jade [name]    MRN: [id number] DOB:  [birth year]   PCP:  No primary care provider on file.  Cardiologist:  Reatha Harps, MD  Electrophysiologist:  None   Referring MD: Natalia Leatherwood, DO   Chief Complaint  Patient presents with   Follow-up    History of Present Illness:   Jade [name] is a 62 y.o. female with a hx of anxiety who presents for follow-up.  She describes daily episodes of palpitations, shortness of breath and chest discomfort.  She tells me symptoms are occurring daily.  They are occurring multiple times throughout the day.  She tells me her heart can race and can do this for hours.  She also tells me she gets pressure in her chest.  The symptoms can occur independent of concomitantly.  She also describes shortness of breath.  She tells me she has no increase in stress or anxiety.  Her EKG in office is normal.  We discussed her testing from 2 years ago which showed a normal coronary CTA and normal echocardiogram.  She says that her brother and sister have been diagnosed with atrial fibrillation and she is worried about this.  I did reassure her that her EKG is normal today.  Her cardiovascular examination is normal.  She describes adjustment in medications recently.  She was taking a muscle relaxer.  She also tells me symptoms have slightly improved over the weekend.  Her symptoms are described as pressure in her chest that can last all day.  Activity can increase it.  The palpitations occur randomly as well.  She also reports recent vitamin D supplementation.  We discussed checking a vitamin D level to make sure this is not high.  Recent lab work shows a total cholesterol 202, HDL 59, LDL 123, TG 95.  A1c 5.9.  Hemoglobin 13.8.  Serum creatinine 0.63.  TSH 1.94.   Past Medical History: Past Medical History:  Diagnosis Date   Anxiety    Asthma    uses inhaler prn   Chest pain    Colon polyps    Diarrhea    with  constipation while taking diet pills (Alli)   History of rectal bleeding     2 times in past   HSV-2 (herpes simplex virus 2) infection    Palpitations    in past/due to Advil pm   Vitamin D deficiency     Past Surgical History: Past Surgical History:  Procedure Laterality Date   ABDOMINAL HYSTERECTOMY  1998   fibroids. partial   CESAREAN SECTION     1 time   COLONOSCOPY W/ POLYPECTOMY  2017   RESECTOSCOPIC MYOMECTOMY      Current Medications: Current Meds  Medication Sig   AIRSUPRA 90-80 MCG/ACT AERO SMARTSIG:2 Puff(s) By Mouth Every 2 Hours PRN   meloxicam (MOBIC) 7.5 MG tablet Take 7.5 mg by mouth daily as needed.   metoprolol tartrate (LOPRESSOR) 100 MG tablet Take 2 hours prior to CT   valACYclovir (VALTREX) 500 MG tablet Take 1 tablet (500 mg total) by mouth daily. (Patient taking differently: Take 500 mg by mouth daily as needed.)     Allergies:    Ciprocin-fluocin-procin [fluocinolone acetonide] and Sulfa antibiotics   Social History: Social History   Socioeconomic History   Marital status: Married    Spouse name: Not on file   Number of children: 1   Years of education: Not on file  Highest education level: Not on file  Occupational History   Occupation: Armed forces operational officer  Tobacco Use   Smoking status: Never   Smokeless tobacco: Never  Vaping Use   Vaping status: Never Used  Substance and Sexual Activity   Alcohol use: Not Currently    Alcohol/week: 1.0 - 2.0 standard drink of alcohol    Types: 1 - 2 Glasses of wine per week    Comment: occ   Drug use: No   Sexual activity: Yes    Partners: Male    Birth control/protection: Surgical    Comment: DECLINED INSURANCE QUESTIONS,DES NEG  Other Topics Concern   Not on file  Social History Narrative   Married. One child.   College-educated, works as a Armed forces operational officer.   Smoke alarm in the home. Wears her seatbelt.   Feels safe in her relationship.   Social Determinants of Health   Financial  Resource Strain: Not on file  Food Insecurity: Not on file  Transportation Needs: Not on file  Physical Activity: Not on file  Stress: Not on file  Social Connections: Unknown (08/26/2021)   [facility] from [facility], [facility]   Social Network    Social Network: Not on file     Family History: The patient's family history includes Asthma in her mother; Atrial fibrillation in her brother and sister; Diabetes in her father and paternal uncle; Heart disease in her father; Hypertension in her father; Lung cancer in her paternal grandfather; Prostate cancer in her father; Thyroid disease in her sister. There is no history of Colon cancer, Colon polyps, Rectal cancer, Stomach cancer, or Esophageal cancer.  ROS:   All other ROS reviewed and negative. Pertinent positives noted in the HPI.     EKGs/Labs/Other Studies Reviewed:   The following studies were personally reviewed by me today:  EKG:  EKG is ordered today.    EKG Interpretation Date/Time:  Monday January 04 2023 15:23:57 EDT Ventricular Rate:  73 PR Interval:  142 QRS Duration:  88 QT Interval:  410 QTC Calculation: 451 R Axis:   43  Text Interpretation: Normal sinus rhythm Normal ECG Confirmed by Lennie Odor [phone]) on 01/04/2023 3:28:14 PM   CCTA 12/19/2020 IMPRESSION: 1. Coronary calcium score of 0. This was 0 percentile for age-, race-, and sex-matched controls.   2. Normal coronary origin with co-dominance.   3. No evidence of CAD.  CAD-RADS 0.   4.  Consider non-cardiac causes of chest pain.   5.  Mild atherosclerosis of the aorta.  TTE 01/02/2021  1. Left ventricular ejection fraction, by estimation, is 60 to 65%. The  left ventricle has normal function. The left ventricle has no regional  wall motion abnormalities. Left ventricular diastolic parameters were  normal.   2. Right ventricular systolic function is normal. The right ventricular  size is normal. There is normal pulmonary artery systolic  pressure. The  estimated right ventricular systolic pressure is 23.2 mmHg.   3. The mitral valve is normal in structure. Mild mitral valve  regurgitation. No evidence of mitral stenosis.   4. The aortic valve is normal in structure. Aortic valve regurgitation is  not visualized. No aortic stenosis is present.   5. The inferior vena cava is normal in size with greater than 50%  respiratory variability, suggesting right atrial pressure of 3 mmHg.   Recent Labs: 10/09/2022: ALT 11; BUN 14; Creatinine, Ser 0.63; Hemoglobin 13.8; Platelets 243.0; Potassium 4.0; Sodium 140; TSH 1.94   Recent Lipid Panel  Component Value Date/Time   CHOL 202 (H) 10/09/2022 0818   TRIG 95.0 10/09/2022 0818   HDL 59.60 10/09/2022 0818   CHOLHDL 3 10/09/2022 0818   VLDL 19.0 10/09/2022 0818   LDLCALC 123 (H) 10/09/2022 0818   LDLCALC 122 (H) 09/13/2020 1132    Physical Exam:   VS:  BP 116/70   Pulse 73   Ht 5\' 7"  (1.702 m)   Wt 215 lb 3.2 oz (97.6 kg)   SpO2 98%   BMI 33.71 kg/m    Wt Readings from Last 3 Encounters:  01/04/23 215 lb 3.2 oz (97.6 kg)  10/09/22 210 lb 9.6 oz (95.5 kg)  01/16/22 218 lb (98.9 kg)    General: Well nourished, well developed, in no acute distress Head: Atraumatic, normal size  Eyes: PEERLA, EOMI  Neck: Supple, no JVD Endocrine: No thryomegaly Cardiac: Normal S1, S2; RRR; no murmurs, rubs, or gallops Lungs: Clear to auscultation bilaterally, no wheezing, rhonchi or rales  Abd: Soft, nontender, no hepatomegaly  Ext: No edema, pulses 2+ Musculoskeletal: No deformities, BUE and BLE strength normal and equal Skin: Warm and dry, no rashes   Neuro: Alert and oriented to person, place, time, and situation, CNII-XII grossly intact, no focal deficits  Psych: Normal mood and affect   ASSESSMENT:   Illianna Bernacki is a 62 y.o. female who presents for the following: 1. Chest pain of uncertain etiology   2. H/O vitamin D deficiency   3. Palpitations   4. Precordial pain    5. Pre-procedure lab exam     PLAN:   1. Chest pain of uncertain etiology 2. H/O vitamin D deficiency 3. Palpitations 4. Precordial pain 5. Pre-procedure lab exam -She reports 2 weeks of daily chest pressure and palpitations.  Also shortness of breath.  EKG is normal.  CV exam is normal.  Recent lab work including thyroid studies are normal.  She describes no increased anxiety or depression.  Her coronary CTA 2 years ago was normal.  Calcium score 0.  Would recommend we proceed with a 2-week Zio patch to exclude arrhythmia.  I did reassure her that her EKG is normal today.  We also discussed watchful waiting versus repeating her coronary CTA.  Given her ongoing symptoms she wishes to have this evaluated further.  We will repeat her coronary CTA.  BMP today.  100 mg of metoprolol tartrate 2 hours before the scan.  We will also check her vitamin D level given the recent supplementation of this.  I do wonder if this is contributing.  She will see me back in 3 months for further evaluation.      Disposition: Return in about 3 months (around 04/05/2023).  Medication Adjustments/Labs and Tests Ordered: Current medicines are reviewed at length with the patient today.  Concerns regarding medicines are outlined above.  Orders Placed This Encounter  Procedures   CT CORONARY MORPH W/CTA COR W/SCORE W/CA W/CM &/OR WO/CM   VITAMIN D 25 Hydroxy (Vit-D Deficiency, Fractures)   Basic Metabolic Panel (BMET)   LONG TERM MONITOR (3-14 DAYS)   EKG 12-Lead   Meds ordered this encounter  Medications   metoprolol tartrate (LOPRESSOR) 100 MG tablet    Sig: Take 2 hours prior to CT    Dispense:  1 tablet    Refill:  0   Patient Instructions  Medication Instructions:  Your physician recommends that you continue on your current medications as directed. Please refer to the Current Medication list given to you today.  *  If you need a refill on your cardiac medications before your next appointment, please call  your pharmacy*   Lab Work: Your physician recommends that you return for lab work in: BMET, Vit D If you have labs (blood work) drawn today and your tests are completely normal, you will receive your results only by: MyChart Message (if you have MyChart) OR A paper copy in the mail If you have any lab test that is abnormal or we need to change your treatment, we will call you to review the results.   Testing/Procedures:   Your cardiac CT will be scheduled at one of the below locations:   [facility] [address] [place], [place] [address] [phone]   If scheduled at [facility], please arrive at the [facility] and Children's Entrance (Entrance C2) of [facility] 30 minutes prior to test start time. You can use the FREE valet parking offered at entrance C (encouraged to control the heart rate for the test)  Proceed to the [facility] Radiology Department (first floor) to check-in and test prep.  All radiology patients and guests should use entrance C2 at [facility], accessed from [facility], even though the hospital's physical address listed is [address]    There is spacious parking and easy access to the radiology department from the [facility] entrance. Please enter here and check-in with the desk attendant.   Please follow these instructions carefully (unless otherwise directed):  On the Night Before the Test: Be sure to Drink plenty of water. Do not consume any caffeinated/decaffeinated beverages or chocolate 12 hours prior to your test. Do not take any antihistamines 12 hours prior to your test.  On the Day of the Test: Drink plenty of water until 1 hour prior to the test. Do not eat any food 1 hour prior to test. You may take your regular medications prior to the test.  Take metoprolol (Lopressor) two hours prior to test. If you take Furosemide/Hydrochlorothiazide/Spironolactone, please  HOLD on the morning of the test. FEMALES- please wear underwire-free bra if available, avoid dresses & tight clothing       After the Test: Drink plenty of water. After receiving IV contrast, you may experience a mild flushed feeling. This is normal. On occasion, you may experience a mild rash up to 24 hours after the test. This is not dangerous. If this occurs, you can take Benadryl 25 mg and increase your fluid intake. If you experience trouble breathing, this can be serious. If it is severe call 911 IMMEDIATELY. If it is mild, please call our office. If you take any of these medications: Glipizide/Metformin, Avandament, Glucavance, please do not take 48 hours after completing test unless otherwise instructed.  We will call to schedule your test 2-4 weeks out understanding that some insurance companies will need an authorization prior to the service being performed.   For more information and frequently asked questions, please visit our website : [email or web address]  For non-scheduling related questions, please contact the cardiac imaging nurse navigator should you have any questions/concerns: Cardiac Imaging Nurse Navigators Direct Office Dial: [phone]   For scheduling needs, including cancellations and rescheduling, please call [place], [phone].  ZIO XT- Long Term Monitor Instructions  Your physician has requested you wear a ZIO patch monitor for 14 days.  This is a single patch monitor. [employer] supplies one patch monitor per enrollment. Additional stickers are not available. Please do not apply patch  if you will be having a Nuclear Stress Test,  Echocardiogram, Cardiac CT, MRI, or Chest Xray during the period you would be wearing the  monitor. The patch cannot be worn during these tests. You cannot remove and re-apply the  ZIO XT patch monitor.  Your ZIO patch monitor will be mailed 3 day USPS to your address on file. It may take 3-5 days  to receive your  monitor after you have been enrolled.  Once you have received your monitor, please review the enclosed instructions. Your monitor  has already been registered assigning a specific monitor serial # to you.  Billing and Patient Assistance Program Information  We have supplied [employer] with any of your insurance information on file for billing purposes. [employer] offers a sliding scale Patient Assistance Program for patients that do not have  insurance, or whose insurance does not completely cover the cost of the ZIO monitor.  You must apply for the Patient Assistance Program to qualify for this discounted rate.  To apply, please call [employer] at [phone], select option 4, select option 2, ask to apply for  Patient Assistance Program. Meredeth Ide will ask your household income, and how many people  are in your household. They will quote your out-of-pocket cost based on that information.  [employer] will also be able to set up a 27-month, interest-free payment plan if needed.  Applying the monitor   Shave hair from upper left chest.  Hold abrader disc by orange tab. Rub abrader in 40 strokes over the upper left chest as  indicated in your monitor instructions.  Clean area with 4 enclosed alcohol pads. Let dry.  Apply patch as indicated in monitor instructions. Patch will be placed under collarbone on left  side of chest with arrow pointing upward.  Rub patch adhesive wings for 2 minutes. Remove white label marked "1". Remove the white  label marked "2". Rub patch adhesive wings for 2 additional minutes.  While looking in a mirror, press and release button in center of patch. A small green light will  flash 3-4 times. This will be your only indicator that the monitor has been turned on.  Do not shower for the first 24 hours. You may shower after the first 24 hours.  Press the button if you feel a symptom. You will hear a small click. Record Date, Time and  Symptom in the Patient Logbook.  When you  are ready to remove the patch, follow instructions on the last 2 pages of Patient  Logbook. Stick patch monitor onto the last page of Patient Logbook.  Place Patient Logbook in the blue and white box. Use locking tab on box and tape box closed  securely. The blue and white box has prepaid postage on it. Please place it in the mailbox as  soon as possible. Your physician should have your test results approximately 7 days after the  monitor has been mailed back to [facility].  Call [facility] Customer Care at [phone] if you have questions regarding  your ZIO XT patch monitor. Call them immediately if you see an orange light blinking on your  monitor.  If your monitor falls off in less than 4 days, contact our Monitor department at [phone].  If your monitor becomes loose or falls off after 4 days call [employer] at [phone] for  suggestions on securing your monitor   Follow-Up: At [facility], you and your health needs are our priority.  As part of our continuing mission to  provide you with exceptional heart care, we have created designated Provider Care Teams.  These Care Teams include your primary Cardiologist (physician) and Advanced Practice Providers (APPs -  Physician Assistants and Nurse Practitioners) who all work together to provide you with the care you need, when you need it.  Your next appointment:   3 month(s)  Provider:   Reatha Harps, MD    Time Spent with Patient: I have spent a total of 35 minutes with patient reviewing hospital notes, telemetry, EKGs, labs and examining the patient as well as establishing an assessment and plan that was discussed with the patient.  > 50% of time was spent in direct patient care.  Signed, Lenna Gilford. Flora Lipps, MD, [facility]  [facility]  [address], [address] [place], [place] [address] [phone]  01/04/2023 6:29 PM

## 2023-01-04 NOTE — Progress Notes (Unsigned)
Enrolled for [employer] to mail a ZIO XT long term holter monitor to the patients address on file.  

## 2023-01-04 NOTE — Telephone Encounter (Signed)
Returned call to pt with Dr. Marylene Buerger advice as below. Scheduled pt for 01/06/23 at 9:20 am. Went to VM and LM to call us back.    Normal CCTA and echo last year. Reassuring. She needs a follow-up. I think I have opening on 9/11.

## 2023-01-05 LAB — BASIC METABOLIC PANEL
BUN/Creatinine Ratio: 19 (ref 12–28)
BUN: 12 mg/dL (ref 8–27)
CO2: 23 mmol/L (ref 20–29)
Calcium: 9.4 mg/dL (ref 8.7–10.3)
Chloride: 101 mmol/L (ref 96–106)
Creatinine, Ser: 0.64 mg/dL (ref 0.57–1.00)
Glucose: 89 mg/dL (ref 70–99)
Potassium: 4.3 mmol/L (ref 3.5–5.2)
Sodium: 139 mmol/L (ref 134–144)
eGFR: 101 mL/min/{1.73_m2} (ref >59)

## 2023-01-05 LAB — VITAMIN D 25 HYDROXY (VIT D DEFICIENCY, FRACTURES): Vit D, 25-Hydroxy: 38 ng/mL (ref 30.0–100.0)

## 2023-01-06 ENCOUNTER — Ambulatory Visit: Payer: BC Managed Care – PPO | Admitting: Cardiovascular Disease

## 2023-01-08 DIAGNOSIS — R002 Palpitations: Secondary | ICD-10-CM

## 2023-01-13 ENCOUNTER — Telehealth (HOSPITAL_COMMUNITY): Payer: Self-pay | Admitting: *Deleted

## 2023-01-13 ENCOUNTER — Encounter (HOSPITAL_COMMUNITY): Payer: Self-pay

## 2023-01-13 NOTE — Telephone Encounter (Signed)
Received transfer call from Centralized Scheduling. Patient requesting to rescheduled her cardiac CT due to being sick. New appt made for September 27.  Larey Brick RN Navigator Cardiac Imaging [facility] Heart and Vascular Services [phone] Office [phone] Cell

## 2023-01-15 ENCOUNTER — Ambulatory Visit (HOSPITAL_COMMUNITY): Payer: BC Managed Care – PPO

## 2023-01-20 ENCOUNTER — Telehealth (HOSPITAL_COMMUNITY): Payer: Self-pay | Admitting: *Deleted

## 2023-01-20 NOTE — Telephone Encounter (Signed)
Attempted to call patient regarding upcoming cardiac CT appointment. °Left message on voicemail with name and callback number ° °Edie Vallandingham RN Navigator Cardiac Imaging °[facility] °[phone] Office °[phone] Cell ° °

## 2023-01-21 ENCOUNTER — Telehealth (HOSPITAL_COMMUNITY): Payer: Self-pay | Admitting: *Deleted

## 2023-01-21 NOTE — Telephone Encounter (Signed)
Reaching out to patient to offer assistance regarding upcoming cardiac imaging study; pt verbalizes understanding of appt date/time, parking situation and where to check in, pre-test NPO status and medications ordered, and verified current allergies; name and call back number provided for further questions should they arise Hayley Sharpe RN Navigator Cardiac Imaging [facility] [phone] office [phone] cell  

## 2023-01-22 ENCOUNTER — Ambulatory Visit (HOSPITAL_COMMUNITY): Admission: RE | Admit: 2023-01-22 | Payer: BC Managed Care – PPO | Source: Ambulatory Visit

## 2023-01-26 ENCOUNTER — Other Ambulatory Visit: Payer: Self-pay | Admitting: Cardiovascular Disease

## 2023-01-26 ENCOUNTER — Telehealth: Payer: Self-pay | Admitting: Cardiovascular Disease

## 2023-01-26 DIAGNOSIS — R002 Palpitations: Secondary | ICD-10-CM | POA: Diagnosis not present

## 2023-01-26 MED ORDER — METOPROLOL SUCCINATE ER 25 MG PO TB24: 25.0000 mg | ORAL_TABLET | Freq: Every day | ORAL | 3 refills | Status: DC

## 2023-01-26 NOTE — Telephone Encounter (Signed)
Their was disconnect while trying to get to triage

## 2023-01-26 NOTE — Telephone Encounter (Signed)
Spoke with Dorene Grebe at [facility] who reports that the patient had an episode of rapid A Fib on 9/20 at 6:59pm with heart rate of 207bpm. This lasted for 60 seconds. The report is posted and event is on page 27, strip 7. Will make ordering provider aware.

## 2023-01-26 NOTE — Addendum Note (Signed)
Addended by: Sande Rives on: 01/26/2023 08:39 PM   Modules accepted: Orders

## 2023-01-26 NOTE — Telephone Encounter (Signed)
Calling with abnormal results. Please advise  

## 2023-01-27 ENCOUNTER — Other Ambulatory Visit: Payer: Self-pay

## 2023-01-29 ENCOUNTER — Other Ambulatory Visit: Payer: Self-pay

## 2023-01-29 ENCOUNTER — Telehealth: Payer: Self-pay | Admitting: Cardiovascular Disease

## 2023-01-29 ENCOUNTER — Ambulatory Visit (HOSPITAL_COMMUNITY)
Admission: RE | Admit: 2023-01-29 | Discharge: 2023-01-29 | Disposition: A | Discharge status: Home / Self Care | Payer: BC Managed Care – PPO | Source: Ambulatory Visit | Attending: Cardiovascular Disease | Admitting: Cardiovascular Disease

## 2023-01-29 DIAGNOSIS — R002 Palpitations: Secondary | ICD-10-CM

## 2023-01-29 DIAGNOSIS — R072 Precordial pain: Secondary | ICD-10-CM | POA: Diagnosis not present

## 2023-01-29 MED ORDER — IOHEXOL 350 MG/ML SOLN
100.0000 mL | Freq: Once | INTRAVENOUS | Status: AC | PRN
  Administered 2023-01-29: 100 mL via INTRAVENOUS

## 2023-01-29 MED ORDER — NITROGLYCERIN 0.4 MG SL SUBL
SUBLINGUAL_TABLET | SUBLINGUAL | Status: AC
  Filled 2023-01-29: qty 2

## 2023-01-29 MED ORDER — NITROGLYCERIN 0.4 MG SL SUBL
0.8000 mg | SUBLINGUAL_TABLET | Freq: Once | SUBLINGUAL | Status: AC
  Administered 2023-01-29: 0.8 mg via SUBLINGUAL

## 2023-01-29 NOTE — Telephone Encounter (Signed)
Spoke to patient. She is concerned about taking Metoprolol succinate 25 mg daily. She states she reviewed  medication info  "It can slow heartrate"  Patient states she has had 2 episodes of her heart rate dipping to 32. She states she become nauseous ,fatigue, dizziness.  Patient states she felt this way this morning after taking Metoprolol tartrate 100 mg for today's CCTA as she was driving the procedure today   Patient also wanted to know if she need to do anything about referral to see EP.  RN informed patient to hold taking Metoprolol succinate until Dr Flora Lipps responds and referral has been palced. Office willcall her withan appointment.  Patient voiced understanding.

## 2023-01-29 NOTE — Telephone Encounter (Signed)
Pt c/o medication issue:  1. Name of Medication:   metoprolol tartrate (LOPRESSOR) 100 MG tablet   2. How are you currently taking this medication (dosage and times per day)?   As prescribed  3. Are you having a reaction (difficulty breathing--STAT)?   4. What is your medication issue?   Patient stated she was prescribed a dose of this medication to be taken prior to her test today and after she took this medication she felt nauseous, dizzy and lightheaded.  Patient stated she has also had 3 episodes of low HR (32) and usually at night.  Patient is concerned this medication may already be lowering her heart rate.

## 2023-01-29 NOTE — Telephone Encounter (Signed)
Called patient. Patient made aware of recommendation from Dr. Scharlene Gloss to take metoprolol at night.  Verbalized understanding. No questions or concerns expressed at this time.

## 2023-01-29 NOTE — Telephone Encounter (Signed)
Would recommend she take metoprolol at night. Her average heart rate on her monitor was 89. I am not concerned about a low heart rate.  Gerri Spore T. Flora Lipps, MD, [facility]  [facility] [address] [place], [place] [address] [phone] 2:55 PM

## 2023-02-04 NOTE — Progress Notes (Signed)
Electrophysiology Office Note:   Date:  02/05/2023  ID:  Virjean Boman, DOB 04-13-1961, MRN [id number]  Primary Cardiologist: Reatha Harps, MD Electrophysiologist: Nobie Putnam, MD      History of Present Illness:   Jade [name] is a 62 y.o. female with h/o anxiety and palpitations who is seen today for Electrophysiology evaluation of atrial fibrillation at the request of Dr. Flora Lipps.    Patient saw Dr. Flora Lipps on 01/04/23 for complaints of daily episodes of palpitations, shortness of breath, and chest discomfort. Coronary CTA was done which was normal. Zio monitor was ordered which revealed a 4% AF burden.  Patient reports daily episodes of palpitations accompanied by shortness of breath, sometimes dizziness and presyncope.   Review of systems complete and found to be negative unless listed in HPI.   EP Information / Studies Reviewed:   Zio     EKG is ordered today. Personal review as below. EKG Interpretation Date/Time:  Friday February 05 2023 08:52:02 EDT Ventricular Rate:  97 PR Interval:    QRS Duration:  84 QT Interval:  392 QTC Calculation: 497 R Axis:   74  Text Interpretation: Sinus rhythm with frequent premature atrial complexes. When compared with ECG of 01/04/23 frequent PACs are now present. Confirmed by Nobie Putnam [phone]) on 02/05/2023 9:03:48 AM   Echo 01/03/23:  IMPRESSIONS  1. Left ventricular ejection fraction, by estimation, is 60 to 65%. The  left ventricle has normal function. The left ventricle has no regional  wall motion abnormalities. Left ventricular diastolic parameters were  normal.   2. Right ventricular systolic function is normal. The right ventricular  size is normal. There is normal pulmonary artery systolic pressure. The estimated right ventricular systolic pressure is 23.2 mmHg.   3. The mitral valve is normal in structure. Mild mitral valve  regurgitation. No evidence of mitral stenosis.   4. The aortic valve is normal in structure.  Aortic valve regurgitation is  not visualized. No aortic stenosis is present.   5. The inferior vena cava is normal in size with greater than 50%  respiratory variability, suggesting right atrial pressure of 3 mmHg.  6. The left and right atria are normal in size.   Coronary CTA 01/29/23: IMPRESSION: 1. Coronary artery calcium score 0 Agatston units. This suggests low risk for future cardiac events.   2.  No significant coronary artery disease noted.  Risk Assessment/Calculations:    CHA2DS2-VASc Score = 1   This indicates a 0.6% annual risk of stroke. The patient's score is based upon: CHF History: 0 HTN History: 0 Diabetes History: 0 Stroke History: 0 Vascular Disease History: 0 Age Score: 0 Gender Score: 1              Physical Exam:   VS:  BP 114/70   Pulse 83   Ht 5\' 7"  (1.702 m)   Wt 215 lb 12.8 oz (97.9 kg)   SpO2 96%   BMI 33.80 kg/m    Wt Readings from Last 3 Encounters:  02/05/23 215 lb 12.8 oz (97.9 kg)  01/04/23 215 lb 3.2 oz (97.6 kg)  10/09/22 210 lb 9.6 oz (95.5 kg)     GEN: Well nourished, well developed in no acute distress NECK: No JVD; No carotid bruits CARDIAC: Normal rate. Irregular rhythm. RESPIRATORY:  Clear to auscultation without rales, wheezing or rhonchi  ABDOMEN: Soft, non-tender, non-distended EXTREMITIES:  No edema; No deformity   ASSESSMENT AND PLAN:   Jade [name] is a 62 y.o. female with  h/o anxiety and palpitations who is seen today for Electrophysiology evaluation of atrial fibrillation at the request of Dr. Flora Lipps.   #. Paroxysmal atrial fibrillation, symptomatic:  #. Frequent PACs:  - While wearing her Zio, not all symptom triggered events correlate with her episodes of atrial fibrillation. Some episodes were sinus rhythm and I suspect a component of anxiety contributing. 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  - Discussed treatment options today for AF including  antiarrhythmic drug therapy and ablation. Discussed risks, recovery and likelihood of success with each treatment strategy. Risk, benefits, and alternatives to EP study and ablation for afib were discussed. These risks include but are not limited to stroke, bleeding, vascular damage, tamponade, perforation, damage to the esophagus, lungs, phrenic nerve and other structures, pulmonary vein stenosis, worsening renal function, coronary vasospasm and death.  Discussed potential need for repeat ablation procedures and antiarrhythmic drugs after an initial ablation. The patient understands these risk and wishes to proceed with drug therapy at this time.  - Start flecainide 100mg  twice daily and metoprolol XL 25mg  once daily for now. We will perform ECG ETT test in 7-10 days. She will follow up with EP APP in 8 weeks to assess her response. She would be an ablation candidate if she fails/does not tolerate AAD therapy or wants to stop medications at any point.  #. Secondary hypercoagulable state due to atrial fibrillation:  - CHADSVASC score of 1 (female sex).  - No anti-coagulation for now. If she wants to pursue ablation then we will need to start [facility].  Follow up with EP APP  in 8 weeks.   Total time of encounter: 61 minutes total time of encounter, including chart review, face-to-face patient care, coordination of care and counseling regarding high complexity medical decision making.  Signed, Nobie Putnam, MD

## 2023-02-05 ENCOUNTER — Ambulatory Visit: Payer: BC Managed Care – PPO | Attending: Cardiology | Admitting: Cardiology

## 2023-02-05 ENCOUNTER — Ambulatory Visit (HOSPITAL_COMMUNITY): Payer: BC Managed Care – PPO

## 2023-02-05 ENCOUNTER — Encounter: Payer: Self-pay | Admitting: Cardiology

## 2023-02-05 VITALS — BP 114/70 | HR 83 | Ht 67.0 in | Wt 215.8 lb

## 2023-02-05 DIAGNOSIS — I480 Paroxysmal atrial fibrillation: Secondary | ICD-10-CM | POA: Diagnosis not present

## 2023-02-05 DIAGNOSIS — R002 Palpitations: Secondary | ICD-10-CM | POA: Diagnosis not present

## 2023-02-05 DIAGNOSIS — I4891 Unspecified atrial fibrillation: Secondary | ICD-10-CM

## 2023-02-05 DIAGNOSIS — D6869 Other thrombophilia: Secondary | ICD-10-CM

## 2023-02-05 MED ORDER — FLECAINIDE ACETATE 100 MG PO TABS: 100.0000 mg | ORAL_TABLET | Freq: Two times a day (BID) | ORAL | 3 refills | Status: DC

## 2023-02-05 NOTE — Addendum Note (Signed)
Addended by: Frutoso Schatz on: 02/05/2023 09:24 AM   Modules accepted: Orders

## 2023-02-05 NOTE — Patient Instructions (Addendum)
Medication Instructions:  Your physician has recommended you make the following change in your medication:  1) START taking flecainide 100 mg twice daily   *If you need a refill on your cardiac medications before your next appointment, please call your pharmacy*  Testing/Procedures: Your physician has requested that you have an exercise tolerance test in 7-10 days. For further information please visit [email or web address] Please also follow instruction sheet, as given.  Follow-Up: At [facility], you and your health needs are our priority.  As part of our continuing mission to provide you with exceptional heart care, we have created designated Provider Care Teams.  These Care Teams include your primary Cardiologist (physician) and Advanced Practice Providers (APPs -  Physician Assistants and Nurse Practitioners) who all work together to provide you with the care you need, when you need it.   Your next appointment:   8 weeks  Provider:   Katrina Stack, PA, Francis Dowse, PA, or Canary Brim, NP

## 2023-02-05 NOTE — Addendum Note (Signed)
Addended by: Nobie Putnam D on: 02/05/2023 09:33 AM   Modules accepted: Orders

## 2023-02-08 ENCOUNTER — Ambulatory Visit: Payer: BC Managed Care – PPO | Admitting: Family Medicine

## 2023-02-08 ENCOUNTER — Encounter: Payer: Self-pay | Admitting: Family Medicine

## 2023-02-08 VITALS — BP 98/64 | HR 73 | Temp 98.0°F | Wt 217.2 lb

## 2023-02-08 DIAGNOSIS — R5383 Other fatigue: Secondary | ICD-10-CM

## 2023-02-08 DIAGNOSIS — I480 Paroxysmal atrial fibrillation: Secondary | ICD-10-CM | POA: Diagnosis not present

## 2023-02-08 LAB — COMPREHENSIVE METABOLIC PANEL
ALT: 12 U/L (ref 0–35)
AST: 15 U/L (ref 0–37)
Albumin: 4.1 g/dL (ref 3.5–5.2)
Alkaline Phosphatase: 57 U/L (ref 39–117)
BUN: 14 mg/dL (ref 6–23)
CO2: 26 meq/L (ref 19–32)
Calcium: 9.2 mg/dL (ref 8.4–10.5)
Chloride: 106 meq/L (ref 96–112)
Creatinine, Ser: 0.62 mg/dL (ref 0.40–1.20)
GFR: 96.92 mL/min (ref >60.00)
Glucose, Bld: 96 mg/dL (ref 70–99)
Potassium: 3.7 meq/L (ref 3.5–5.1)
Sodium: 140 meq/L (ref 135–145)
Total Bilirubin: 0.5 mg/dL (ref 0.2–1.2)
Total Protein: 6.9 g/dL (ref 6.0–8.3)

## 2023-02-08 LAB — TSH: TSH: 1.06 u[IU]/mL (ref 0.35–5.50)

## 2023-02-08 LAB — CORTISOL: Cortisol, Plasma: 3 ug/dL

## 2023-02-08 LAB — T3, FREE: T3, Free: 3.6 pg/mL (ref 2.3–4.2)

## 2023-02-08 LAB — T4, FREE: Free T4: 0.86 ng/dL (ref 0.60–1.60)

## 2023-02-08 NOTE — Progress Notes (Unsigned)
Patient ID: Jade [name], female  DOB: 05-Mar-1961, 62 y.o.   MRN: [id number] Patient Care Team    Relationship Specialty Notifications Start End  Natalia Leatherwood, DO PCP - General Family Medicine  01/27/23   O'Neal, Ronnald Ramp, MD PCP - Cardiology Cardiology  01/04/23   Nobie Putnam, MD PCP - Electrophysiology Cardiology  02/01/23   Hilarie Fredrickson, MD Consulting Physician Gastroenterology  05/14/17   Felicie Morn, OD Referring Physician Optometry  05/14/17   Coralyn Mark, MD Consulting Physician Allergy  05/14/17     Chief Complaint  Patient presents with   Palpitations    Saw cardio on 10/11 dx w/ afib would like second opinion from PCP    Subjective: Jade [name] is a 62 y.o.  Female  present for discussion on A.fib All past medical history, surgical history, allergies, family history, immunizations, medications and social history were updated in the electronic medical record today. All recent labs, ED visits and hospitalizations within the last year were reviewed.  A.fib:     02/08/2023    9:42 AM 10/09/2022    2:44 PM 09/19/2021    8:09 AM 08/19/2021    4:14 PM 09/13/2020   10:49 AM  Depression screen PHQ 2/9  Decreased Interest 1 0 0 0 0  Down, Depressed, Hopeless 0 0 0 0 0  PHQ - 2 Score 1 0 0 0 0      05/14/2017    9:19 AM  GAD 7 : Generalized Anxiety Score  Nervous, Anxious, on Edge 0  Control/stop worrying 0  Worry too much - different things 0  Trouble relaxing 0  Restless 0  Easily annoyed or irritable 0  Afraid - awful might happen 0  Total GAD 7 Score 0    Immunization History  Administered Date(s) Administered   Hepb-cpg 09/19/2021, 10/24/2021   Influenza,inj,Quad PF,6+ Mos 03/31/2019   Influenza-Unspecified 01/25/2017, 01/25/2021, 01/09/2023   Moderna Sars-Covid-2 Vaccination 04/18/2019, 05/12/2019   Tdap 09/13/2020   Zoster Recombinant(Shingrix) 09/13/2020, 12/20/2020   Past Medical History:  Diagnosis Date   Anxiety    Asthma     uses inhaler prn   Chest pain    Colon polyps    Diarrhea    with constipation while taking diet pills (Alli)   History of rectal bleeding     2 times in past   HSV-2 (herpes simplex virus 2) infection    Palpitations    in past/due to Advil pm   Vitamin D deficiency    Allergies  Allergen Reactions   Ciprocin-Fluocin-Procin [Fluocinolone Acetonide]     SOB   Sulfa Antibiotics     hives   Past Surgical History:  Procedure Laterality Date   ABDOMINAL HYSTERECTOMY  1998   fibroids. partial   CESAREAN SECTION     1 time   COLONOSCOPY W/ POLYPECTOMY  2017   RESECTOSCOPIC MYOMECTOMY     Family History  Problem Relation Age of Onset   Asthma Mother    Diabetes Father    Hypertension Father    Heart disease Father    Prostate cancer Father    Atrial fibrillation Sister    Thyroid disease Sister    Atrial fibrillation Brother    Diabetes Paternal Uncle    Lung cancer Paternal Grandfather    Colon cancer Neg Hx    Colon polyps Neg Hx    Rectal cancer Neg Hx    Stomach cancer Neg Hx    Esophageal cancer  Neg Hx    Social History   Social History Narrative   Married. One child.   College-educated, works as a Armed forces operational officer.   Smoke alarm in the home. Wears her seatbelt.   Feels safe in her relationship.    Allergies as of 02/08/2023       Reactions   Ciprocin-fluocin-procin [fluocinolone Acetonide]    SOB   Sulfa Antibiotics    hives        Medication List        Accurate as of February 08, 2023 10:01 AM. If you have any questions, ask your nurse or doctor.          STOP taking these medications    metoprolol tartrate 100 MG tablet Commonly known as: LOPRESSOR Stopped by: Felix Pacini       TAKE these medications    Airsupra 90-80 MCG/ACT Aero Generic drug: Albuterol-Budesonide SMARTSIG:2 Puff(s) By Mouth Every 2 Hours PRN   flecainide 100 MG tablet Commonly known as: TAMBOCOR Take 1 tablet (100 mg total) by mouth 2 (two) times  daily.   meloxicam 7.5 MG tablet Commonly known as: MOBIC Take 7.5 mg by mouth daily as needed.   metoprolol succinate 25 MG 24 hr tablet Commonly known as: TOPROL-XL Take 1 tablet (25 mg total) by mouth daily. Take with or immediately following a meal.   valACYclovir 500 MG tablet Commonly known as: VALTREX Take 1 tablet (500 mg total) by mouth daily. What changed:  when to take this reasons to take this        All past medical history, surgical history, allergies, family history, immunizations andmedications were updated in the EMR today and reviewed under the history and medication portions of their EMR.     lly Signed   By: Larose Hires D.O.   On: 05/27/2021 16:50   ROS 14 pt review of systems performed and negative (unless mentioned in an HPI)  Objective: BP 98/64   Pulse 73   Temp 98 F (36.7 C)   Wt 217 lb 3.2 oz (98.5 kg)   SpO2 98%   BMI 34.02 kg/m  Physical Exam Vitals and nursing note reviewed.  Constitutional:      General: She is not in acute distress.    Appearance: Normal appearance. She is not ill-appearing, toxic-appearing or diaphoretic.  HENT:     Head: Normocephalic and atraumatic.  Eyes:     General: No scleral icterus.       Right eye: No discharge.        Left eye: No discharge.     Extraocular Movements: Extraocular movements intact.     Conjunctiva/sclera: Conjunctivae normal.     Pupils: Pupils are equal, round, and reactive to light.  Cardiovascular:     Rate and Rhythm: Normal rate and regular rhythm.  Pulmonary:     Effort: Pulmonary effort is normal. No respiratory distress.     Breath sounds: Normal breath sounds. No wheezing, rhonchi or rales.  Musculoskeletal:     Cervical back: Neck supple.     Right lower leg: No edema.     Left lower leg: No edema.  Skin:    General: Skin is warm.     Findings: No rash.  Neurological:     Mental Status: She is alert and oriented to person, place, and time. Mental status is at  baseline.     Motor: No weakness.     Gait: Gait normal.  Psychiatric:  Mood and Affect: Mood normal.        Behavior: Behavior normal.        Thought Content: Thought content normal.        Judgment: Judgment normal.       No results found.  Assessment/plan: Jade [name] is a 62 y.o. female present for CPE  ***    No follow-ups on file.   Orders Placed This Encounter  Procedures   Comp Met (CMET)   TSH   T4, free   T3, free   Cortisol   No orders of the defined types were placed in this encounter.  Referral Orders  No referral(s) requested today     Electronically signed by: Felix Pacini, DO [facility] [place]

## 2023-02-09 ENCOUNTER — Telehealth: Payer: Self-pay | Admitting: Family Medicine

## 2023-02-09 DIAGNOSIS — R7989 Other specified abnormal findings of blood chemistry: Secondary | ICD-10-CM

## 2023-02-09 NOTE — Telephone Encounter (Signed)
Please call patient Electrolyte levels are all normal. Liver, kidney and thyroid function is normal. Cortisol appeared mildly low, this was the opposite of what she was most concerned about.   Most likely the cortisol being low is secondary to her recent symptoms and A-fib diagnoses/illness.  Would recommend we repeat her cortisol, first morning appointment at 8 AM in 2 weeks.  Lab appointment only.  If continues to be lower than normal on that appointment, then we would refer her to an endocrinologist to further evaluate

## 2023-02-10 NOTE — Telephone Encounter (Signed)
LM for pt to return call to discuss.  

## 2023-02-11 ENCOUNTER — Encounter: Payer: Self-pay | Admitting: Family Medicine

## 2023-02-11 NOTE — Patient Instructions (Signed)

## 2023-02-12 ENCOUNTER — Ambulatory Visit (HOSPITAL_COMMUNITY): Payer: BC Managed Care – PPO

## 2023-02-18 ENCOUNTER — Ambulatory Visit: Payer: BC Managed Care – PPO | Attending: Cardiology

## 2023-02-18 DIAGNOSIS — D6869 Other thrombophilia: Secondary | ICD-10-CM

## 2023-02-18 DIAGNOSIS — I480 Paroxysmal atrial fibrillation: Secondary | ICD-10-CM

## 2023-02-18 DIAGNOSIS — R002 Palpitations: Secondary | ICD-10-CM

## 2023-02-18 DIAGNOSIS — I4891 Unspecified atrial fibrillation: Secondary | ICD-10-CM | POA: Diagnosis not present

## 2023-02-18 LAB — EXERCISE TOLERANCE TEST
Angina Index: 0
Duke Treadmill Score: 5
Estimated workload: 6.7
Exercise duration (min): 4 min
Exercise duration (sec): 47 s
MPHR: 160 {beats}/min
Peak HR: 129 {beats}/min
Percent HR: 80 %
RPE: 17
Rest HR: 76 {beats}/min
ST Depression (mm): 0 mm

## 2023-02-19 ENCOUNTER — Other Ambulatory Visit (INDEPENDENT_AMBULATORY_CARE_PROVIDER_SITE_OTHER): Payer: BC Managed Care – PPO

## 2023-02-19 DIAGNOSIS — R7989 Other specified abnormal findings of blood chemistry: Secondary | ICD-10-CM | POA: Diagnosis not present

## 2023-02-19 LAB — CORTISOL: Cortisol, Plasma: 8.6 ug/dL

## 2023-02-19 NOTE — Progress Notes (Signed)
Pt came for labs only, tolerated draw well.   

## 2023-02-26 ENCOUNTER — Telehealth: Payer: Self-pay | Admitting: Cardiovascular Disease

## 2023-02-26 NOTE — Telephone Encounter (Signed)
Relayed message that Dr Carmon Ginsberg agree with second opinion.

## 2023-02-26 NOTE — Telephone Encounter (Signed)
Patient called and said that she wanted to get a second opinion about getting an ablation which was recommended by Dr. Jimmey Ralph. Said that she was going to get a second opinion from Dr. Sinclair Grooms in [place] and wanted to make sure it was ok with Dr. Flora Lipps

## 2023-03-01 ENCOUNTER — Telehealth: Payer: Self-pay | Admitting: Cardiovascular Disease

## 2023-03-01 NOTE — Telephone Encounter (Signed)
Left voicemail to return call to office.

## 2023-03-01 NOTE — Telephone Encounter (Signed)
Patient is requested to have [place] fax her medical records including test results to Dr. Isaac Bliss office. Patient stated she is considering having her ablation completed with them and would like [place] to send any information they might would need to consider the ablation. Patient stated the fax number is [phone]. Patient requested for a call back with an update. Patient stated if she does not answer to leave a detailed VM.

## 2023-03-02 NOTE — Telephone Encounter (Signed)
Called and spoke to patient  Patient states:   -would like recent notes/records faxed to Dr. Leward Quan office   -she has not signed a ROI  Advised patient:   -contact Dr. Leward Quan office to obtain information they will need faxed   -Present to office to sign ROI  Patient agrees with plan, no further questions at this time

## 2023-03-02 NOTE — Telephone Encounter (Signed)
Patient returned RN's call and stated she is on her lunch break until 2:00 pm and can leave a voice message.

## 2023-03-04 ENCOUNTER — Telehealth: Payer: Self-pay | Admitting: Cardiovascular Disease

## 2023-03-04 MED ORDER — DILTIAZEM HCL ER COATED BEADS 120 MG PO CP24: 120.0000 mg | ORAL_CAPSULE | Freq: Every evening | ORAL | 3 refills | Status: DC

## 2023-03-04 NOTE — Telephone Encounter (Signed)
Left message to return call 

## 2023-03-04 NOTE — Telephone Encounter (Signed)
Below message relayed to patient and new prescription sent to requested pharmacy. Patient verbalize understanding and agree.    Switch from metoprolol to diltiazem XR 120 mg daily. She should take this at night.

## 2023-03-04 NOTE — Telephone Encounter (Signed)
  Pt c/o medication issue:  1. Name of Medication:   metoprolol succinate (TOPROL-XL) 25 MG 24 hr tablet    2. How are you currently taking this medication (dosage and times per day)? As written  3. Are you having a reaction (difficulty breathing--STAT)? No   4. What is your medication issue? Pt said, since she start taking this medication she's been suffering from insomnia and because of that its harder for her to function during the day. She would like to ask Dr. Flora Lipps if he can change this medication. She said, she will be at work today if she unable to answer to leave her a detailed message.

## 2023-03-05 ENCOUNTER — Telehealth: Payer: Self-pay | Admitting: Cardiology

## 2023-03-05 ENCOUNTER — Ambulatory Visit: Payer: BC Managed Care – PPO | Admitting: Family Medicine

## 2023-03-05 DIAGNOSIS — I4891 Unspecified atrial fibrillation: Secondary | ICD-10-CM

## 2023-03-05 NOTE — Telephone Encounter (Signed)
Pt called in stating she has decided to go forward with the ablation.

## 2023-03-08 NOTE — Telephone Encounter (Signed)
Pt following up per phone note on 11/8. Please advise

## 2023-03-09 ENCOUNTER — Telehealth: Payer: Self-pay | Admitting: Cardiology

## 2023-03-09 MED ORDER — APIXABAN 5 MG PO TABS: 5.0000 mg | ORAL_TABLET | Freq: Two times a day (BID) | ORAL | Status: DC

## 2023-03-09 MED ORDER — APIXABAN 5 MG PO TABS: 5.0000 mg | ORAL_TABLET | Freq: Two times a day (BID) | ORAL | 1 refills | Status: DC

## 2023-03-09 NOTE — Telephone Encounter (Signed)
Per Dr. Jimmey Ralph, okay to go ahead and schedule. Patient will need to start Eliquis 5 mg twice daily 4 weeks prior to her ablation.   Called patient and scheduled her for 12/24 at 730am. She will start Eliquis on 11/26.

## 2023-03-09 NOTE — Addendum Note (Signed)
Addended by: Frutoso Schatz on: 03/09/2023 11:58 AM   Modules accepted: Orders

## 2023-03-09 NOTE — Telephone Encounter (Signed)
Left message for patient to call back.  Per Dr. Jimmey Ralph she does not need another CT scan prior to her ablation as long as she does not miss any doses of Eliquis for 4 weeks before her ablation

## 2023-03-09 NOTE — Telephone Encounter (Signed)
Patient called to follow-up with RN Kinnie Feil.  Patient stated she had a CT scan 5-6 weeks ago and wants to know if she will need to have another scan.  Patient also wants to know if she will need to stop her metoprolol prior to her ablation.

## 2023-03-10 NOTE — Telephone Encounter (Signed)
Spoke with pt and went over the note Carly, RN had made stating that Dr. Jimmey Ralph said the pt doesn't need another CT scan prior to the ablation as long as the pt doesn't miss any Eliquis doses for 4 weeks prior to the ablation. Pt verbalized understanding and had no further questions.

## 2023-03-10 NOTE — Telephone Encounter (Signed)
Pt returning nurse call. Will be available til 8-8:30 or 1-2 today because she will be with pts

## 2023-03-18 ENCOUNTER — Telehealth: Payer: Self-pay | Admitting: Cardiology

## 2023-03-18 NOTE — Telephone Encounter (Signed)
Pt states she is going to be getting shots in her knees and wants to make sure it is not going to cause any problems with her ablation on the 24th of December. Please advise

## 2023-03-18 NOTE — Telephone Encounter (Signed)
Left message for patient to call back  

## 2023-03-18 NOTE — Telephone Encounter (Signed)
Patient is returning phone call.  °

## 2023-03-22 ENCOUNTER — Ambulatory Visit (HOSPITAL_COMMUNITY): Payer: BC Managed Care – PPO

## 2023-03-29 ENCOUNTER — Encounter: Payer: Self-pay | Admitting: Cardiology

## 2023-03-29 DIAGNOSIS — I4891 Unspecified atrial fibrillation: Secondary | ICD-10-CM

## 2023-03-29 DIAGNOSIS — I480 Paroxysmal atrial fibrillation: Secondary | ICD-10-CM

## 2023-03-29 DIAGNOSIS — R002 Palpitations: Secondary | ICD-10-CM

## 2023-04-01 NOTE — Progress Notes (Signed)
Electrophysiology Office Note:   Date:  04/02/2023  ID:  Jade [name], DOB 07/17/1960, MRN [id number]  Primary Cardiologist: Reatha Harps, MD Electrophysiologist: Nobie Putnam, MD      History of Present Illness:   Jade [name] is a 62 y.o. female with h/o paroxysmal AF seen today for routine electrophysiology followup.   Recent history of frequent palpitations, SOB and chest discomfort. Cardiology evaluation included a coronary CTA which was normal and a Zio which showed 4% AF burden.  She was referred to Dr. Jimmey Ralph for evaluation on 02/05/23 and options for control were discussed.  Patient elected to move forward with AF ablation. She was started on flecainide and toprol.  Exercise testing was completed and was normal (she was not on flecainide).   Since last being seen in our clinic the patient reports she has felt insomnia on metoprolol. `She reports she typically has intolerances to medications.  She has been taking only the metoprolol. She filled the flecainide but did not take it.  Notes she didn't realize the ETT was for flecainide and it was completed off the medication.  She confirms she has not missed any doses of Eliquis.   She denies chest pain, palpitations, dyspnea, PND, orthopnea, nausea, vomiting, dizziness, syncope, edema, weight gain, or early satiety.   Review of systems complete and found to be negative unless listed in HPI.   EP Information / Studies Reviewed:    EKG is ordered today. Personal review as below.      EKG 04/02/23 > SR 69 bpm (not on flecainide)  Studies:  ECHO 12/2022 > LVEF 60-65%, no RWMA Zio 01/26/23 >  4% AF burden, & rapid SVT Coronary CT with Score 01/29/23 > coronary calcium score 0, low risk for future cardiac events, no significant CAD ETT 02/18/23 > no ST deviation noted, exercise capacity mildly impaired, no ischemic changes noted at sub-target HR.  PT WAS NOT ON FLECAINIDE.   Arrhythmia / AAD Paroxysmal AF > dx in 01/2023     Risk Assessment/Calculations:    CHA2DS2-VASc Score = 1   This indicates a 0.6% annual risk of stroke. The patient's score is based upon: CHF History: 0 HTN History: 0 Diabetes History: 0 Stroke History: 0 Vascular Disease History: 0 Age Score: 0 Gender Score: 1              Physical Exam:   VS:  BP 112/84   Pulse 69   Ht 5\' 7"  (1.702 m)   Wt 221 lb 9.6 oz (100.5 kg)   BMI 34.71 kg/m    Wt Readings from Last 3 Encounters:  04/02/23 221 lb 9.6 oz (100.5 kg)  02/08/23 217 lb 3.2 oz (98.5 kg)  02/05/23 215 lb 12.8 oz (97.9 kg)     GEN: Well nourished, well developed in no acute distress NECK: No JVD; No carotid bruits CARDIAC: Regular rate and rhythm, no murmurs, rubs, gallops RESPIRATORY:  Clear to auscultation without rales, wheezing or rhonchi  ABDOMEN: Soft, non-tender, non-distended EXTREMITIES:  No edema; No deformity   ASSESSMENT AND PLAN:    Paroxysmal Atrial Fibrillation  PAC's  Pulmonary Vein AT CHA2DS2-VASc 1 (for female) -no missed doses of Eliquis, reviewed importance of taking medication -pending ablation 12/24 > reviewed ablation process, instructions given to patient  -continue Toprol 25mg   daily (confirmed dose with [facility]) -not taking flecainide / never started   -EKG reviewed, NSR  -pre-procedure labs  -had CT in Oct 2024   Follow up with Dr. Jimmey Ralph  as planned for AF Ablation 12/24    Signed, Canary Brim, MSN, APRN, NP-C, AGACNP-BC [facility] - Electrophysiology  04/02/2023, 8:38 AM

## 2023-04-02 ENCOUNTER — Other Ambulatory Visit: Payer: Self-pay

## 2023-04-02 ENCOUNTER — Encounter: Payer: Self-pay | Admitting: Pulmonary Disease

## 2023-04-02 ENCOUNTER — Ambulatory Visit: Payer: BC Managed Care – PPO | Attending: Pulmonary Disease | Admitting: Pulmonary Disease

## 2023-04-02 VITALS — BP 112/84 | HR 69 | Ht 67.0 in | Wt 221.6 lb

## 2023-04-02 DIAGNOSIS — I480 Paroxysmal atrial fibrillation: Secondary | ICD-10-CM

## 2023-04-02 DIAGNOSIS — I4891 Unspecified atrial fibrillation: Secondary | ICD-10-CM

## 2023-04-02 DIAGNOSIS — R002 Palpitations: Secondary | ICD-10-CM

## 2023-04-02 NOTE — Patient Instructions (Signed)
Medication Instructions:   Your physician recommends that you continue on your current medications as directed. Please refer to the Current Medication list given to you today.   *If you need a refill on your cardiac medications before your next appointment, please call your pharmacy*   Lab Work:  TODAY!!!! BMET/CBC  If you have labs (blood work) drawn today and your tests are completely normal, you will receive your results only by: MyChart Message (if you have MyChart) OR A paper copy in the mail If you have any lab test that is abnormal or we need to change your treatment, we will call you to review the results.   Testing/Procedures:  Please read letter today. Pt given instructions for ablation.    Follow-Up: At [facility], you and your health needs are our priority.  As part of our continuing mission to provide you with exceptional heart care, we have created designated Provider Care Teams.  These Care Teams include your primary Cardiologist (physician) and Advanced Practice Providers (APPs -  Physician Assistants and Nurse Practitioners) who all work together to provide you with the care you need, when you need it.  We recommend signing up for the patient portal called "MyChart".  Sign up information is provided on this After Visit Summary.  MyChart is used to connect with patients for Virtual Visits (Telemedicine).  Patients are able to view lab/test results, encounter notes, upcoming appointments, etc.  Non-urgent messages can be sent to your provider as well.   To learn more about what you can do with MyChart, go to [email or web address].     Other Instructions  Your follow up appointment will be made at the hospital after ablation.

## 2023-04-03 LAB — BASIC METABOLIC PANEL
BUN/Creatinine Ratio: 27 (ref 12–28)
BUN: 20 mg/dL (ref 8–27)
CO2: 26 mmol/L (ref 20–29)
Calcium: 9 mg/dL (ref 8.7–10.3)
Chloride: 103 mmol/L (ref 96–106)
Creatinine, Ser: 0.75 mg/dL (ref 0.57–1.00)
Glucose: 105 mg/dL — ABNORMAL HIGH (ref 70–99)
Potassium: 4.3 mmol/L (ref 3.5–5.2)
Sodium: 141 mmol/L (ref 134–144)
eGFR: 91 mL/min/{1.73_m2} (ref >59)

## 2023-04-03 LAB — CBC
Hematocrit: 42 % (ref 34.0–46.6)
Hemoglobin: 13.7 g/dL (ref 11.1–15.9)
MCH: 29.1 pg (ref 26.6–33.0)
MCHC: 32.6 g/dL (ref 31.5–35.7)
MCV: 89 fL (ref 79–97)
Platelets: 254 10*3/uL (ref 150–450)
RBC: 4.71 x10E6/uL (ref 3.77–5.28)
RDW: 12.9 % (ref 11.7–15.4)
WBC: 10.3 10*3/uL (ref 3.4–10.8)

## 2023-04-09 ENCOUNTER — Encounter: Payer: Self-pay | Admitting: Family Medicine

## 2023-04-09 ENCOUNTER — Ambulatory Visit: Payer: BC Managed Care – PPO | Admitting: Family Medicine

## 2023-04-09 VITALS — BP 118/72 | HR 71 | Temp 97.6°F | Wt 221.4 lb

## 2023-04-09 DIAGNOSIS — R1013 Epigastric pain: Secondary | ICD-10-CM | POA: Diagnosis not present

## 2023-04-09 DIAGNOSIS — R1011 Right upper quadrant pain: Secondary | ICD-10-CM | POA: Diagnosis not present

## 2023-04-09 LAB — CBC
HCT: 40.4 % (ref 36.0–46.0)
Hemoglobin: 13.5 g/dL (ref 12.0–15.0)
MCHC: 33.4 g/dL (ref 30.0–36.0)
MCV: 88.5 fL (ref 78.0–100.0)
Platelets: 236 10*3/uL (ref 150.0–400.0)
RBC: 4.56 Mil/uL (ref 3.87–5.11)
RDW: 13.8 % (ref 11.5–15.5)
WBC: 8.1 10*3/uL (ref 4.0–10.5)

## 2023-04-09 LAB — C-REACTIVE PROTEIN: CRP: <1 mg/dL (ref 0.5–20.0)

## 2023-04-09 LAB — COMPREHENSIVE METABOLIC PANEL
ALT: 11 U/L (ref 0–35)
AST: 14 U/L (ref 0–37)
Albumin: 3.9 g/dL (ref 3.5–5.2)
Alkaline Phosphatase: 55 U/L (ref 39–117)
BUN: 21 mg/dL (ref 6–23)
CO2: 25 meq/L (ref 19–32)
Calcium: 8.7 mg/dL (ref 8.4–10.5)
Chloride: 106 meq/L (ref 96–112)
Creatinine, Ser: 0.79 mg/dL (ref 0.40–1.20)
GFR: 81.32 mL/min (ref >60.00)
Glucose, Bld: 94 mg/dL (ref 70–99)
Potassium: 4 meq/L (ref 3.5–5.1)
Sodium: 142 meq/L (ref 135–145)
Total Bilirubin: 0.3 mg/dL (ref 0.2–1.2)
Total Protein: 6.4 g/dL (ref 6.0–8.3)

## 2023-04-09 MED ORDER — SUCRALFATE 1 G PO TABS: 1.0000 g | ORAL_TABLET | Freq: Three times a day (TID) | ORAL | 0 refills | Status: DC

## 2023-04-09 MED ORDER — OMEPRAZOLE 40 MG PO CPDR
40.0000 mg | DELAYED_RELEASE_CAPSULE | Freq: Every day | ORAL | 3 refills | Status: DC
Start: 2023-04-09 — End: 2023-04-14

## 2023-04-09 NOTE — Patient Instructions (Addendum)
Return in about 2 weeks (around 04/23/2023), or if symptoms worsen or fail to improve.  Ultrasound ordered - they will call you to schedule.  Start omeprazole in the morning on empty stomach.  Carafate before meals and before bed.             Great to see you today.  I have refilled the medication(s) we provide.   If labs were collected or images ordered, we will inform you of  results once we have received them and reviewed. We will contact you either by echart message, or telephone call.  Please give ample time to the testing facility, and our office to run,  receive and review results. Please do not call inquiring of results, even if you can see them in your chart. We will contact you as soon as we are able. If it has been over 1 week since the test was completed, and you have not yet heard from [place], then please call us.    - echart message- for normal results that have been seen by the patient already.   - telephone call: abnormal results or if patient has not viewed results in their echart.  If a referral to a specialist was entered for you, please call us in 2 weeks if you have not heard from the specialist office to schedule.

## 2023-04-09 NOTE — Progress Notes (Signed)
Jade [name] , [birth year], 62 y.o., female MRN: [id number] Patient Care Team    Relationship Specialty Notifications Start End  Natalia Leatherwood, DO PCP - General Family Medicine  01/27/23   O'Neal, Ronnald Ramp, MD PCP - Cardiology Cardiology  01/04/23   Nobie Putnam, MD PCP - Electrophysiology Cardiology  02/01/23   Hilarie Fredrickson, MD Consulting Physician Gastroenterology  05/14/17   Felicie Morn, OD Referring Physician Optometry  05/14/17   Coralyn Mark, MD Consulting Physician Allergy  05/14/17     Chief Complaint  Patient presents with   Abdominal Pain    URQ-2 weeks on and off     Subjective: Jade [name] is a 62 y.o. Pt presents for an OV with complaints of right upper quadrant pain that has been sharp and intermittent over 2 weeks duration.  She has not had this type of discomfort in the past. She states the pain seems to be occurring about 15-20 minutes after heavier meals.  Pain is sharp and jabbing pain lasts for a few moments until she is able to reposition, and then seems to slowly decrease in intensity, lasting few minutes before complete resolution.   She denies any fevers or chills.   She denies nausea or vomit.    She has been compliant with her metoprolol and Eliquis which was most recently started for her A-fib.  She is undergoing ablation in 2 weeks.     02/08/2023    9:42 AM 10/09/2022    2:44 PM 09/19/2021    8:09 AM 08/19/2021    4:14 PM 09/13/2020   10:49 AM  Depression screen PHQ 2/9  Decreased Interest 1 0 0 0 0  Down, Depressed, Hopeless 0 0 0 0 0  PHQ - 2 Score 1 0 0 0 0    Allergies  Allergen Reactions   Ciprocin-Fluocin-Procin [Fluocinolone Acetonide]     SOB   Sulfa Antibiotics     hives   Social History   Social History Narrative   Married. One child.   College-educated, works as a Armed forces operational officer.   Smoke alarm in the home. Wears her seatbelt.   Feels safe in her relationship.   Past Medical History:  Diagnosis Date    Anxiety    Asthma    uses inhaler prn   Chest pain    Colon polyps    Diarrhea    with constipation while taking diet pills (Alli)   History of rectal bleeding     2 times in past   HSV-2 (herpes simplex virus 2) infection    Palpitations    in past/due to Advil pm   Vitamin D deficiency    Past Surgical History:  Procedure Laterality Date   ABDOMINAL HYSTERECTOMY  1998   fibroids. partial   CESAREAN SECTION     1 time   COLONOSCOPY W/ POLYPECTOMY  2017   RESECTOSCOPIC MYOMECTOMY     Family History  Problem Relation Age of Onset   Asthma Mother    Diabetes Father    Hypertension Father    Heart disease Father    Prostate cancer Father    Atrial fibrillation Sister    Thyroid disease Sister    Atrial fibrillation Brother    Diabetes Paternal Uncle    Lung cancer Paternal Grandfather    Colon cancer Neg Hx    Colon polyps Neg Hx    Rectal cancer Neg Hx    Stomach cancer Neg Hx  Esophageal cancer Neg Hx    Allergies as of 04/09/2023       Reactions   Ciprocin-fluocin-procin [fluocinolone Acetonide]    SOB   Sulfa Antibiotics    hives        Medication List        Accurate as of April 09, 2023 12:24 PM. If you have any questions, ask your nurse or doctor.          Airsupra 90-80 MCG/ACT Aero Generic drug: Albuterol-Budesonide SMARTSIG:2 Puff(s) By Mouth Every 2 Hours PRN   apixaban 5 MG Tabs tablet Commonly known as: ELIQUIS Take 1 tablet (5 mg total) by mouth 2 (two) times daily. What changed: Another medication with the same name was removed. Continue taking this medication, and follow the directions you see here. Changed by: Felix Pacini   meloxicam 7.5 MG tablet Commonly known as: MOBIC Take 7.5 mg by mouth daily as needed.   metoprolol succinate 25 MG 24 hr tablet Commonly known as: TOPROL-XL Take 25 mg by mouth daily.   omeprazole 40 MG capsule Commonly known as: PRILOSEC Take 1 capsule (40 mg total) by mouth daily. Started  by: Felix Pacini   sucralfate 1 g tablet Commonly known as: Carafate Take 1 tablet (1 g total) by mouth 4 (four) times daily -  with meals and at bedtime. Started by: Felix Pacini   valACYclovir 500 MG tablet Commonly known as: VALTREX Take 1 tablet (500 mg total) by mouth daily. What changed:  when to take this reasons to take this        All past medical history, surgical history, allergies, family history, immunizations andmedications were updated in the EMR today and reviewed under the history and medication portions of their EMR.     ROS Negative, with the exception of above mentioned in HPI   Objective:  BP 118/72   Pulse 71   Temp 97.6 F (36.4 C)   Wt 221 lb 6.4 oz (100.4 kg)   SpO2 98%   BMI 34.68 kg/m  Body mass index is 34.68 kg/m. Physical Exam Vitals and nursing note reviewed.  Constitutional:      General: She is not in acute distress.    Appearance: Normal appearance. She is normal weight. She is not ill-appearing or toxic-appearing.  HENT:     Head: Normocephalic and atraumatic.  Eyes:     General: No scleral icterus.       Right eye: No discharge.        Left eye: No discharge.     Extraocular Movements: Extraocular movements intact.     Conjunctiva/sclera: Conjunctivae normal.     Pupils: Pupils are equal, round, and reactive to light.  Abdominal:     General: Abdomen is flat. Bowel sounds are normal. There is no distension.     Palpations: Abdomen is soft. There is no shifting dullness, hepatomegaly or mass.     Tenderness: There is no abdominal tenderness. There is no guarding or rebound. Negative signs include Murphy's sign and McBurney's sign.  Skin:    Findings: No rash.  Neurological:     Mental Status: She is alert and oriented to person, place, and time. Mental status is at baseline.     Motor: No weakness.     Coordination: Coordination normal.     Gait: Gait normal.  Psychiatric:        Mood and Affect: Mood normal.         Behavior: Behavior normal.  Thought Content: Thought content normal.        Judgment: Judgment normal.     No results found. No results found. No results found for this or any previous visit (from the past 24 hours).  Assessment/Plan: Jade [name] is a 62 y.o. female present for OV for  RUQ pain (Primary)/epigastric pain Differential diagnosis: Duodenitis, gastritis, peptic ulcer disease, gallbladder disease, colitis. Exam is essentially normal today.  Pain seems to occur after a meal and is sharp in characteristic concerning for possible gallbladder stones.  Occasional mild nausea involved. We discussed treating with PPI and Carafate while we wait on laboratory results and ultrasound results.  Patient is agreeable with this plan. - Comp Met (CMET) - CBC - C-reactive protein - US Abdomen Complete; Future - omeprazole (PRILOSEC) 40 MG capsule; Take 1 capsule (40 mg total) by mouth daily.  Dispense: 30 capsule; Refill: 3 -Patient was encouraged to make sure she is taking her other medications at least 2 hours before or 2 hours after taking the above prescribed medications.   Reviewed expectations re: course of current medical issues. Discussed self-management of symptoms. Outlined signs and symptoms indicating need for more acute intervention. Patient verbalized understanding and all questions were answered. Patient received an After-Visit Summary.    Orders Placed This Encounter  Procedures   US Abdomen Complete   Comp Met (CMET)   CBC   C-reactive protein   Meds ordered this encounter  Medications   omeprazole (PRILOSEC) 40 MG capsule    Sig: Take 1 capsule (40 mg total) by mouth daily.    Dispense:  30 capsule    Refill:  3   sucralfate (CARAFATE) 1 g tablet    Sig: Take 1 tablet (1 g total) by mouth 4 (four) times daily -  with meals and at bedtime.    Dispense:  60 tablet    Refill:  0   Referral Orders  No referral(s) requested today     Note is  dictated utilizing voice recognition software. Although note has been proof read prior to signing, occasional typographical errors still can be missed. If any questions arise, please do not hesitate to call for verification.   electronically signed by:  Felix Pacini, DO  [facility] [place]

## 2023-04-12 NOTE — Progress Notes (Unsigned)
  Cardiology Office Note:  .   Date:  04/15/2023  ID:  Jade [name], DOB 25-Jan-1961, MRN [id number] PCP: Natalia Leatherwood, DO  [facility] Providers Cardiologist:  Reatha Harps, MD Electrophysiologist:  Nobie Putnam, MD { History of Present Illness: Jade Kitchen   Dekiya [name] is a 62 y.o. female with history of pAF who presents for follow-up.    History of Present Illness   Jade [name], a female with a history of paroxysmal atrial fibrillation, presents for follow-up. She reports that her symptoms have improved significantly since starting metoprolol, although she experiences fatigue as a side effect. Prior to the medication, her symptoms were severe enough to limit her mobility. She has a family history of atrial fibrillation, with her father, brother, and sister all having the condition. She has concerns about the procedure, but is reassured by the doctor's confidence in the surgeon's abilities. She also reports difficulty sleeping, which she attributes to both the medication and anxiety about her condition and upcoming procedure.          Problem List Paroxysmal Afib  -4% burden -CHADSVASC=1  2. PACs -3% burden     ROS: All other ROS reviewed and negative. Pertinent positives noted in the HPI.     Studies Reviewed: Jade Kitchen        CCTA 01/29/2023 IMPRESSION: 1. Coronary artery calcium score 0 Agatston units. This suggests low risk for future cardiac events.   2.  No significant coronary artery disease noted. Physical Exam:   VS:  BP 106/74 (BP Location: Left Arm, Patient Position: Sitting, Cuff Size: Normal)   Pulse 74   Ht 5\' 7"  (1.702 m)   Wt 221 lb (100.2 kg)   SpO2 97%   BMI 34.61 kg/m    Wt Readings from Last 3 Encounters:  04/15/23 221 lb (100.2 kg)  04/09/23 221 lb 6.4 oz (100.4 kg)  04/02/23 221 lb 9.6 oz (100.5 kg)    GEN: Well nourished, well developed in no acute distress NECK: No JVD; No carotid bruits CARDIAC: RRR, no murmurs, rubs,  gallops RESPIRATORY:  Clear to auscultation without rales, wheezing or rhonchi  ABDOMEN: Soft, non-tender, non-distended EXTREMITIES:  No edema; No deformity  ASSESSMENT AND PLAN: .   Assessment and Plan    Paroxysmal Atrial Fibrillation SVT PACs Symptomatic with rapid ventricular response. Improved with Metoprolol succinate 25mg  daily, but reports fatigue. Atrial fibrillation ablation scheduled for 04/20/2023. Family history of atrial fibrillation. No obstructive coronary disease on coronary CTA. -Continue Eliquis 5mg  BID until ablation. -Continue Metoprolol succinate 25mg  daily until ablation. -Post-ablation, plan to discontinue Eliquis after 3 months and Metoprolol after a few weeks, pending evaluation.  Follow-up in 6 months post-ablation.              Follow-up: Return in about 6 months (around 10/14/2023).  Time Spent with Patient: I have spent a total of 25 minutes caring for this patient today face to face, ordering and reviewing labs/tests, reviewing prior records/medical history, examining the patient, establishing an assessment and plan, communicating results/findings to the patient/family, and documenting in the medical record.   Signed, Lenna Gilford. Flora Lipps, MD, [facility]  [facility]  [address], [address] [place], [place] [address] [phone]  2:45 PM

## 2023-04-15 ENCOUNTER — Ambulatory Visit: Payer: BC Managed Care – PPO | Attending: Cardiovascular Disease | Admitting: Cardiovascular Disease

## 2023-04-15 ENCOUNTER — Encounter: Payer: Self-pay | Admitting: Cardiovascular Disease

## 2023-04-15 VITALS — BP 106/74 | HR 74 | Ht 67.0 in | Wt 221.0 lb

## 2023-04-15 DIAGNOSIS — I480 Paroxysmal atrial fibrillation: Secondary | ICD-10-CM

## 2023-04-15 NOTE — Patient Instructions (Signed)
 Medication Instructions:  Your physician recommends that you continue on your current medications as directed. Please refer to the Current Medication list given to you today.    *If you need a refill on your cardiac medications before your next appointment, please call your pharmacy*   Lab Work: NONE    If you have labs (blood work) drawn today and your tests are completely normal, you will receive your results only by: MyChart Message (if you have MyChart) OR A paper copy in the mail If you have any lab test that is abnormal or we need to change your treatment, we will call you to review the results.   Testing/Procedures: NONE   Follow-Up: At [facility], you and your health needs are our priority.  As part of our continuing mission to provide you with exceptional heart care, we have created designated Provider Care Teams.  These Care Teams include your primary Cardiologist (physician) and Advanced Practice Providers (APPs -  Physician Assistants and Nurse Practitioners) who all work together to provide you with the care you need, when you need it.  We recommend signing up for the patient portal called "MyChart".  Sign up information is provided on this After Visit Summary.  MyChart is used to connect with patients for Virtual Visits (Telemedicine).  Patients are able to view lab/test results, encounter notes, upcoming appointments, etc.  Non-urgent messages can be sent to your provider as well.   To learn more about what you can do with MyChart, go to [email or web address].    Your next appointment:   6 month(s)  The format for your next appointment:   In Person  Provider:   Reatha Harps, MD    Other Instructions

## 2023-04-16 ENCOUNTER — Other Ambulatory Visit: Payer: Self-pay | Admitting: Family Medicine

## 2023-04-16 ENCOUNTER — Encounter: Payer: Self-pay | Admitting: Family Medicine

## 2023-04-16 ENCOUNTER — Ambulatory Visit
Admission: RE | Admit: 2023-04-16 | Discharge: 2023-04-16 | Disposition: A | Discharge status: Home / Self Care | Payer: BC Managed Care – PPO | Source: Ambulatory Visit | Attending: Family Medicine | Admitting: Family Medicine

## 2023-04-16 DIAGNOSIS — R1011 Right upper quadrant pain: Secondary | ICD-10-CM | POA: Diagnosis not present

## 2023-04-16 DIAGNOSIS — K760 Fatty (change of) liver, not elsewhere classified: Secondary | ICD-10-CM | POA: Insufficient documentation

## 2023-04-19 ENCOUNTER — Telehealth: Payer: Self-pay

## 2023-04-19 ENCOUNTER — Encounter: Payer: Self-pay | Admitting: General Practice

## 2023-04-19 NOTE — Telephone Encounter (Signed)
Work noted for office visit on 12/19 created and sent via mychart and mail to patient per pt request.

## 2023-04-19 NOTE — Pre-Procedure Instructions (Signed)
Attempted to call patient regarding procedure instructions.  Left voicemail on the following items: Arrival time 0515 Nothing to eat or drink after midnight No meds AM of procedure Responsible person to drive you home and stay with you for 24 hrs  Have you missed any doses of anti-coagulant Eliquis- should be taken twice a day, if you have missed any doses please let us know.  Don't take dose tomorrow morning.

## 2023-04-20 ENCOUNTER — Other Ambulatory Visit: Payer: Self-pay

## 2023-04-20 ENCOUNTER — Ambulatory Visit (HOSPITAL_COMMUNITY)
Admission: RE | Admit: 2023-04-20 | Discharge: 2023-04-20 | Disposition: A | Discharge status: Home / Self Care | Payer: BC Managed Care – PPO | Attending: Cardiology | Admitting: Cardiology

## 2023-04-20 ENCOUNTER — Ambulatory Visit (HOSPITAL_COMMUNITY): Payer: BC Managed Care – PPO | Admitting: Anesthesiology

## 2023-04-20 ENCOUNTER — Encounter (HOSPITAL_COMMUNITY): Payer: Self-pay | Admitting: Cardiology

## 2023-04-20 ENCOUNTER — Ambulatory Visit (HOSPITAL_COMMUNITY)
Admission: RE | Disposition: A | Discharge status: Home / Self Care | Payer: Self-pay | Source: Home / Self Care | Attending: Cardiology

## 2023-04-20 DIAGNOSIS — F419 Anxiety disorder, unspecified: Secondary | ICD-10-CM | POA: Insufficient documentation

## 2023-04-20 DIAGNOSIS — I480 Paroxysmal atrial fibrillation: Secondary | ICD-10-CM | POA: Insufficient documentation

## 2023-04-20 DIAGNOSIS — Z7901 Long term (current) use of anticoagulants: Secondary | ICD-10-CM | POA: Insufficient documentation

## 2023-04-20 DIAGNOSIS — D6869 Other thrombophilia: Secondary | ICD-10-CM | POA: Diagnosis not present

## 2023-04-20 HISTORY — PX: ATRIAL FIBRILLATION ABLATION: EP1191

## 2023-04-20 LAB — POCT ACTIVATED CLOTTING TIME
Activated Clotting Time: 314 s
Activated Clotting Time: 325 s

## 2023-04-20 SURGERY — ATRIAL FIBRILLATION ABLATION
Anesthesia: General

## 2023-04-20 MED ORDER — ONDANSETRON HCL 4 MG/2ML IJ SOLN: 4.0000 mg | Freq: Four times a day (QID) | INTRAMUSCULAR | Status: DC | PRN

## 2023-04-20 MED ORDER — SODIUM CHLORIDE 0.9% FLUSH: 3.0000 mL | Freq: Two times a day (BID) | INTRAVENOUS | Status: DC

## 2023-04-20 MED ORDER — SODIUM CHLORIDE 0.9 % IV SOLN: INTRAVENOUS | Status: DC

## 2023-04-20 MED ORDER — ATROPINE SULFATE 1 MG/10ML IJ SOSY
PREFILLED_SYRINGE | INTRAMUSCULAR | Status: AC
Start: 2023-04-20 — End: ?
  Filled 2023-04-20: qty 10

## 2023-04-20 MED ORDER — SUGAMMADEX SODIUM 200 MG/2ML IV SOLN
INTRAVENOUS | Status: DC | PRN
  Administered 2023-04-20: 200 mg via INTRAVENOUS

## 2023-04-20 MED ORDER — ONDANSETRON HCL 4 MG/2ML IJ SOLN
INTRAMUSCULAR | Status: DC | PRN
  Administered 2023-04-20: 4 mg via INTRAVENOUS

## 2023-04-20 MED ORDER — ATROPINE SULFATE 1 MG/10ML IJ SOSY
PREFILLED_SYRINGE | INTRAMUSCULAR | Status: AC
  Filled 2023-04-20: qty 10

## 2023-04-20 MED ORDER — MIDAZOLAM HCL 5 MG/5ML IJ SOLN
INTRAMUSCULAR | Status: AC
  Filled 2023-04-20: qty 5

## 2023-04-20 MED ORDER — LIDOCAINE 2% (20 MG/ML) 5 ML SYRINGE
INTRAMUSCULAR | Status: DC | PRN
  Administered 2023-04-20: 60 mg via INTRAVENOUS

## 2023-04-20 MED ORDER — SODIUM CHLORIDE 0.9% FLUSH: 3.0000 mL | INTRAVENOUS | Status: DC | PRN

## 2023-04-20 MED ORDER — DEXAMETHASONE SODIUM PHOSPHATE 10 MG/ML IJ SOLN
INTRAMUSCULAR | Status: DC | PRN
  Administered 2023-04-20: 10 mg via INTRAVENOUS

## 2023-04-20 MED ORDER — ACETAMINOPHEN 500 MG PO TABS: 1000.0000 mg | ORAL_TABLET | Freq: Once | ORAL | Status: DC | PRN

## 2023-04-20 MED ORDER — ROCURONIUM BROMIDE 10 MG/ML (PF) SYRINGE
PREFILLED_SYRINGE | INTRAVENOUS | Status: DC | PRN
  Administered 2023-04-20: 60 mg via INTRAVENOUS
  Administered 2023-04-20: 10 mg via INTRAVENOUS

## 2023-04-20 MED ORDER — PROTAMINE SULFATE 10 MG/ML IV SOLN
INTRAVENOUS | Status: DC | PRN
  Administered 2023-04-20: 35 mg via INTRAVENOUS

## 2023-04-20 MED ORDER — ACETAMINOPHEN 325 MG PO TABS
650.0000 mg | ORAL_TABLET | ORAL | Status: DC | PRN
  Administered 2023-04-20: 650 mg via ORAL
  Filled 2023-04-20: qty 2

## 2023-04-20 MED ORDER — PHENYLEPHRINE HCL-NACL 20-0.9 MG/250ML-% IV SOLN
INTRAVENOUS | Status: DC | PRN
  Administered 2023-04-20: 25 ug/min via INTRAVENOUS

## 2023-04-20 MED ORDER — PHENYLEPHRINE 80 MCG/ML (10ML) SYRINGE FOR IV PUSH (FOR BLOOD PRESSURE SUPPORT)
PREFILLED_SYRINGE | INTRAVENOUS | Status: DC | PRN
  Administered 2023-04-20: 160 ug via INTRAVENOUS

## 2023-04-20 MED ORDER — PROPOFOL 500 MG/50ML IV EMUL
INTRAVENOUS | Status: DC | PRN
  Administered 2023-04-20: 50 ug/kg/min via INTRAVENOUS

## 2023-04-20 MED ORDER — HEPARIN (PORCINE) IN NACL 1000-0.9 UT/500ML-% IV SOLN
INTRAVENOUS | Status: DC | PRN
  Administered 2023-04-20 (×3): 500 mL

## 2023-04-20 MED ORDER — ACETAMINOPHEN 10 MG/ML IV SOLN: 1000.0000 mg | Freq: Once | INTRAVENOUS | Status: DC | PRN

## 2023-04-20 MED ORDER — HEPARIN SODIUM (PORCINE) 1000 UNIT/ML IJ SOLN
INTRAMUSCULAR | Status: DC | PRN
  Administered 2023-04-20: 16000 [IU] via INTRAVENOUS
  Administered 2023-04-20: 2000 [IU] via INTRAVENOUS

## 2023-04-20 MED ORDER — ACETAMINOPHEN 160 MG/5ML PO SOLN: 1000.0000 mg | Freq: Once | ORAL | Status: DC | PRN

## 2023-04-20 MED ORDER — FENTANYL CITRATE (PF) 100 MCG/2ML IJ SOLN: 25.0000 ug | INTRAMUSCULAR | Status: DC | PRN

## 2023-04-20 MED ORDER — ATROPINE SULFATE 1 MG/ML IV SOLN
INTRAVENOUS | Status: DC | PRN
  Administered 2023-04-20: 1 mg via INTRAVENOUS

## 2023-04-20 MED ORDER — PROPOFOL 10 MG/ML IV BOLUS
INTRAVENOUS | Status: DC | PRN
  Administered 2023-04-20: 130 mg via INTRAVENOUS

## 2023-04-20 MED ORDER — FENTANYL CITRATE (PF) 100 MCG/2ML IJ SOLN
INTRAMUSCULAR | Status: AC
  Filled 2023-04-20: qty 2

## 2023-04-20 MED ORDER — SODIUM CHLORIDE 0.9 % IV SOLN: 250.0000 mL | INTRAVENOUS | Status: DC | PRN

## 2023-04-20 MED ORDER — APIXABAN 5 MG PO TABS
5.0000 mg | ORAL_TABLET | Freq: Once | ORAL | Status: AC
  Administered 2023-04-20: 5 mg via ORAL
  Filled 2023-04-20: qty 1

## 2023-04-20 MED ORDER — FENTANYL CITRATE (PF) 250 MCG/5ML IJ SOLN
INTRAMUSCULAR | Status: DC | PRN
  Administered 2023-04-20: 100 ug via INTRAVENOUS

## 2023-04-20 MED ORDER — MIDAZOLAM HCL 2 MG/2ML IJ SOLN
INTRAMUSCULAR | Status: DC | PRN
  Administered 2023-04-20: 3 mg via INTRAVENOUS

## 2023-04-20 SURGICAL SUPPLY — 21 items
BAG SNAP BAND KOVER 36X36 (MISCELLANEOUS) IMPLANT
BLANKET WARM UNDERBOD FULL ACC (MISCELLANEOUS) ×2 IMPLANT
CABLE PFA RX CATH CONN (CABLE) IMPLANT
CATH FARAWAVE ABLATION 31 (CATHETERS) IMPLANT
CATH OCTARAY 2.0 F 3-3-3-3-3 (CATHETERS) IMPLANT
CATH SOUNDSTAR ECO 8FR (CATHETERS) IMPLANT
CATH WEBSTER BI DIR CS D-F CRV (CATHETERS) IMPLANT
CLOSURE PERCLOSE PROSTYLE (VASCULAR PRODUCTS) IMPLANT
COVER SWIFTLINK CONNECTOR (BAG) ×2 IMPLANT
DEVICE CLOSURE MYNXGRIP 6/7F (Vascular Products) IMPLANT
DILATOR VESSEL 38 20CM 16FR (INTRODUCER) IMPLANT
GUIDEWIRE INQWIRE 1.5J.035X260 (WIRE) IMPLANT
INQWIRE 1.5J .035X260CM (WIRE) ×1 IMPLANT
KIT VERSACROSS CNCT FARADRIVE (KITS) IMPLANT
PACK EP LF (CUSTOM PROCEDURE TRAY) ×2 IMPLANT
PAD DEFIB RADIO PHYSIO CONN (PAD) ×2 IMPLANT
PATCH CARTO3 (PAD) IMPLANT
SHEATH FARADRIVE STEERABLE (SHEATH) IMPLANT
SHEATH PINNACLE 8F 10CM (SHEATH) IMPLANT
SHEATH PINNACLE 9F 10CM (SHEATH) IMPLANT
SHEATH PROBE COVER 6X72 (BAG) IMPLANT

## 2023-04-20 NOTE — Transfer of Care (Signed)
Immediate Anesthesia Transfer of Care Note  Patient: Jade [name]  Procedure(s) Performed: ATRIAL FIBRILLATION ABLATION  Patient Location: Cath Lab  Anesthesia Type:General  Level of Consciousness: awake  Airway & Oxygen Therapy: Patient Spontanous Breathing  Post-op Assessment: Report given to RN and Post -op Vital signs reviewed and stable  Post vital signs: Reviewed and stable  Last Vitals:  Vitals Value Taken Time  BP 108/78   Temp    Pulse 94   Resp 16   SpO2 100     Last Pain:  Vitals:   04/20/23 0627  TempSrc:   PainSc: 0-No pain         Complications: There were no known notable events for this encounter.

## 2023-04-20 NOTE — Discharge Instructions (Signed)

## 2023-04-20 NOTE — Anesthesia Preprocedure Evaluation (Signed)
Anesthesia Evaluation  Patient identified by MRN, date of birth, ID band Patient awake    Reviewed: Allergy & Precautions, NPO status , Patient's Chart, lab work & pertinent test results, reviewed documented beta blocker date and time   History of Anesthesia Complications Negative for: history of anesthetic complications  Airway Mallampati: II  TM Distance: >3 FB Neck ROM: Full    Dental  (+) Teeth Intact, Dental Advisory Given   Pulmonary neg shortness of breath, asthma , neg sleep apnea, neg COPD, neg recent URI   breath sounds clear to auscultation       Cardiovascular (-) angina (-) Past MI + dysrhythmias Atrial Fibrillation  Rhythm:Regular     Neuro/Psych negative neurological ROS     GI/Hepatic negative GI ROS, Neg liver ROS,,,  Endo/Other  negative endocrine ROS    Renal/GU negative Renal ROS     Musculoskeletal  (+) Arthritis ,    Abdominal   Peds  Hematology  (+) Blood dyscrasia Lab Results      Component                Value               Date                      WBC                      8.1                 04/09/2023                HGB                      13.5                04/09/2023                HCT                      40.4                04/09/2023                MCV                      88.5                04/09/2023                PLT                      236.0               04/09/2023             eliquis   Anesthesia Other Findings   Reproductive/Obstetrics                             Anesthesia Physical Anesthesia Plan  ASA: 2  Anesthesia Plan: General   Post-op Pain Management: Minimal or no pain anticipated   Induction: Intravenous  PONV Risk Score and Plan: 3 and Ondansetron, Dexamethasone, Propofol infusion and TIVA  Airway Management Planned: Oral ETT  Additional Equipment: None  Intra-op Plan:   Post-operative Plan: Extubation in  OR  Informed Consent: I have reviewed  the patients History and Physical, chart, labs and discussed the procedure including the risks, benefits and alternatives for the proposed anesthesia with the patient or authorized representative who has indicated his/her understanding and acceptance.     Dental advisory given  Plan Discussed with: CRNA  Anesthesia Plan Comments:        Anesthesia Quick Evaluation

## 2023-04-20 NOTE — Anesthesia Procedure Notes (Signed)
Procedure Name: Intubation Date/Time: 04/20/2023 7:56 AM  Performed by: April Holding, CRNAPre-anesthesia Checklist: Patient identified, Emergency Drugs available, Suction available and Patient being monitored Patient Re-evaluated:Patient Re-evaluated prior to induction Oxygen Delivery Method: Circle System Utilized Preoxygenation: Pre-oxygenation with 100% oxygen Induction Type: IV induction Ventilation: Mask ventilation without difficulty Laryngoscope Size: Miller and 2 Grade View: Grade I Tube type: Oral Tube size: 7.0 mm Number of attempts: 1 Airway Equipment and Method: Stylet and Oral airway Placement Confirmation: ETT inserted through vocal cords under direct vision, positive ETCO2 and breath sounds checked- equal and bilateral Secured at: 22 cm Tube secured with: Tape Dental Injury: Teeth and Oropharynx as per pre-operative assessment

## 2023-04-20 NOTE — H&P (Signed)
Electrophysiology Office Note:   Date:  04/20/23  ID:  Jezabell Sinyard, DOB [birth year], MRN [id number]   Primary Cardiologist: Reatha Harps, MD Electrophysiologist: Nobie Putnam, MD       History of Present Illness:   Jade [name] is a 62 y.o. female with h/o anxiety and palpitations who is seen today for Electrophysiology evaluation of atrial fibrillation at the request of Dr. Flora Lipps.    Patient saw Dr. Flora Lipps on 01/04/23 for complaints of daily episodes of palpitations, shortness of breath, and chest discomfort. Coronary CTA was done which was normal. Zio monitor was ordered which revealed a 4% AF burden.  Patient reports daily episodes of palpitations accompanied by shortness of breath, sometimes dizziness and presyncope.   Interval: Patient presents today for planned ablation. Doing relatively well. Continues to have paroxysms of atrial fibrillation, most recently last Friday. No missed doses of oral anti-coagulation. No new or acute complaints.   Review of systems complete and found to be negative unless listed in HPI.    EP Information / Studies Reviewed:   Zio      EKG is ordered today. Personal review as below. EKG Interpretation Date/Time:                  Friday February 05 2023 08:52:02 EDT Ventricular Rate:         97 PR Interval:                   QRS Duration:             84 QT Interval:                 392 QTC Calculation:497 R Axis:                         74   Text Interpretation:Sinus rhythm with frequent premature atrial complexes. When compared with ECG of 01/04/23 frequent PACs are now present. Confirmed by Nobie Putnam [phone]) on 02/05/2023 9:03:48 AM    Echo 01/03/23:  IMPRESSIONS  1. Left ventricular ejection fraction, by estimation, is 60 to 65%. The  left ventricle has normal function. The left ventricle has no regional  wall motion abnormalities. Left ventricular diastolic parameters were  normal.   2. Right ventricular systolic function is  normal. The right ventricular  size is normal. There is normal pulmonary artery systolic pressure. The estimated right ventricular systolic pressure is 23.2 mmHg.   3. The mitral valve is normal in structure. Mild mitral valve  regurgitation. No evidence of mitral stenosis.   4. The aortic valve is normal in structure. Aortic valve regurgitation is  not visualized. No aortic stenosis is present.   5. The inferior vena cava is normal in size with greater than 50%  respiratory variability, suggesting right atrial pressure of 3 mmHg.  6. The left and right atria are normal in size.    Coronary CTA 01/29/23: IMPRESSION: 1. Coronary artery calcium score 0 Agatston units. This suggests low risk for future cardiac events.   2.  No significant coronary artery disease noted.   Risk Assessment/Calculations:     CHA2DS2-VASc Score = 1   This indicates a 0.6% annual risk of stroke. The patient's score is based upon: CHF History: 0 HTN History: 0 Diabetes History: 0 Stroke History: 0 Vascular Disease History: 0 Age Score: 0 Gender Score: 1             Physical Exam:  Today's Vitals   04/20/23 0547 04/20/23 0627  BP: 102/73   Pulse: 64   Resp: 18   Temp: 98.1 F (36.7 C)   TempSrc: Oral   SpO2: 98%   Weight: 99.8 kg   Height: 5\' 6"  (1.676 m)   PainSc:  0-No pain   Body mass index is 35.51 kg/m.   GEN: Well nourished, well developed in no acute distress NECK: No JVD; No carotid bruits CARDIAC: Normal rate. Irregular rhythm. RESPIRATORY:  Clear to auscultation without rales, wheezing or rhonchi  ABDOMEN: Soft, non-tender, non-distended EXTREMITIES:  No edema; No deformity    ASSESSMENT AND PLAN:   Jade [name] is a 62 y.o. female with h/o anxiety and palpitations who is seen today for Electrophysiology evaluation of atrial fibrillation at the request of Dr. Flora Lipps.    #. Paroxysmal atrial fibrillation, symptomatic:  - Discussed treatment options today for AF  including antiarrhythmic drug therapy and ablation. Discussed risks, recovery and likelihood of success with each treatment strategy. Risk, benefits, and alternatives to EP study and ablation for afib were discussed. These risks include but are not limited to stroke, bleeding, vascular damage, tamponade, perforation, damage to the esophagus, lungs, phrenic nerve and other structures, pulmonary vein stenosis, worsening renal function, coronary vasospasm and death.  Discussed potential need for repeat ablation procedures and antiarrhythmic drugs after an initial ablation. The patient understands these risk and wishes to proceed with catheter ablation today.  - She never started flecainide so we will discontinue this in her chart.    #. Secondary hypercoagulable state due to atrial fibrillation:  - CHADSVASC score of 1 (female sex).  - We started oral anti-coagulation in anticipation of ablation. She will need to remain on oral anti-coagulation for 3 months after, at least.    Signed, Nobie Putnam, MD

## 2023-04-22 ENCOUNTER — Other Ambulatory Visit: Payer: Self-pay

## 2023-04-22 ENCOUNTER — Emergency Department (HOSPITAL_COMMUNITY): Payer: BC Managed Care – PPO

## 2023-04-22 ENCOUNTER — Encounter (HOSPITAL_COMMUNITY): Payer: Self-pay | Admitting: Cardiology

## 2023-04-22 ENCOUNTER — Observation Stay (HOSPITAL_COMMUNITY)
Admission: EM | Admit: 2023-04-22 | Discharge: 2023-04-23 | Disposition: A | Discharge status: Home / Self Care | Payer: BC Managed Care – PPO | Attending: Cardiology | Admitting: Cardiology

## 2023-04-22 DIAGNOSIS — I319 Disease of pericardium, unspecified: Secondary | ICD-10-CM | POA: Diagnosis not present

## 2023-04-22 DIAGNOSIS — R7989 Other specified abnormal findings of blood chemistry: Secondary | ICD-10-CM | POA: Diagnosis not present

## 2023-04-22 DIAGNOSIS — I493 Ventricular premature depolarization: Secondary | ICD-10-CM | POA: Diagnosis not present

## 2023-04-22 DIAGNOSIS — I309 Acute pericarditis, unspecified: Principal | ICD-10-CM | POA: Insufficient documentation

## 2023-04-22 DIAGNOSIS — Z7901 Long term (current) use of anticoagulants: Secondary | ICD-10-CM | POA: Insufficient documentation

## 2023-04-22 DIAGNOSIS — I480 Paroxysmal atrial fibrillation: Secondary | ICD-10-CM | POA: Diagnosis not present

## 2023-04-22 DIAGNOSIS — Z8679 Personal history of other diseases of the circulatory system: Secondary | ICD-10-CM | POA: Insufficient documentation

## 2023-04-22 DIAGNOSIS — J45901 Unspecified asthma with (acute) exacerbation: Secondary | ICD-10-CM | POA: Diagnosis not present

## 2023-04-22 DIAGNOSIS — I308 Other forms of acute pericarditis: Secondary | ICD-10-CM | POA: Diagnosis not present

## 2023-04-22 DIAGNOSIS — R0989 Other specified symptoms and signs involving the circulatory and respiratory systems: Secondary | ICD-10-CM | POA: Diagnosis not present

## 2023-04-22 DIAGNOSIS — N9985 Post endometrial ablation syndrome: Secondary | ICD-10-CM | POA: Diagnosis not present

## 2023-04-22 DIAGNOSIS — R0602 Shortness of breath: Secondary | ICD-10-CM | POA: Insufficient documentation

## 2023-04-22 DIAGNOSIS — R079 Chest pain, unspecified: Secondary | ICD-10-CM

## 2023-04-22 DIAGNOSIS — R0789 Other chest pain: Secondary | ICD-10-CM | POA: Diagnosis not present

## 2023-04-22 DIAGNOSIS — Z9889 Other specified postprocedural states: Secondary | ICD-10-CM | POA: Insufficient documentation

## 2023-04-22 LAB — TROPONIN I (HIGH SENSITIVITY)
Troponin I (High Sensitivity): 5316 ng/L (ref <18)
Troponin I (High Sensitivity): 3903 ng/L (ref <18)

## 2023-04-22 LAB — ECHOCARDIOGRAM COMPLETE
AR max vel: 2.05 cm2
AV Area VTI: 2 cm2
AV Area mean vel: 2.06 cm2
AV Mean grad: 5 mm[Hg]
AV Peak grad: 7.8 mm[Hg]
Ao pk vel: 1.4 m/s
Area-P 1/2: 2.09 cm2
S' Lateral: 3.2 cm

## 2023-04-22 LAB — BASIC METABOLIC PANEL
Anion gap: 10 (ref 5–15)
BUN: 13 mg/dL (ref 6–20)
CO2: 24 mmol/L (ref 22–32)
Calcium: 9 mg/dL (ref 8.9–10.3)
Chloride: 105 mmol/L (ref 98–111)
Creatinine, Ser: 0.63 mg/dL (ref 0.44–1.00)
GFR, Estimated: >60 mL/min (ref >60)
Glucose, Bld: 92 mg/dL (ref 70–99)
Potassium: 3.8 mmol/L (ref 3.5–5.1)
Sodium: 139 mmol/L (ref 135–145)

## 2023-04-22 LAB — CBC
HCT: 37.1 % (ref 36.0–46.0)
Hemoglobin: 12.2 g/dL (ref 12.0–15.0)
MCH: 28.8 pg (ref 26.0–34.0)
MCHC: 32.9 g/dL (ref 30.0–36.0)
MCV: 87.5 fL (ref 80.0–100.0)
Platelets: 208 10*3/uL (ref 150–400)
RBC: 4.24 MIL/uL (ref 3.87–5.11)
RDW: 13.8 % (ref 11.5–15.5)
WBC: 10.1 10*3/uL (ref 4.0–10.5)
nRBC: 0 % (ref 0.0–0.2)

## 2023-04-22 LAB — SEDIMENTATION RATE: Sed Rate: 24 mm/h — ABNORMAL HIGH (ref 0–22)

## 2023-04-22 LAB — C-REACTIVE PROTEIN: CRP: 0.5 mg/dL (ref <1.0)

## 2023-04-22 LAB — BRAIN NATRIURETIC PEPTIDE: B Natriuretic Peptide: 49.3 pg/mL (ref 0.0–100.0)

## 2023-04-22 MED ORDER — IBUPROFEN 400 MG PO TABS: 600.0000 mg | ORAL_TABLET | Freq: Once | ORAL | Status: DC

## 2023-04-22 MED ORDER — KETOROLAC TROMETHAMINE 15 MG/ML IJ SOLN
15.0000 mg | Freq: Once | INTRAMUSCULAR | Status: AC
  Administered 2023-04-22: 15 mg via INTRAVENOUS
  Filled 2023-04-22: qty 1

## 2023-04-22 MED ORDER — APIXABAN 5 MG PO TABS
5.0000 mg | ORAL_TABLET | Freq: Two times a day (BID) | ORAL | Status: DC
  Administered 2023-04-22: 5 mg via ORAL
  Filled 2023-04-22: qty 1

## 2023-04-22 MED ORDER — PANTOPRAZOLE SODIUM 40 MG PO TBEC
40.0000 mg | DELAYED_RELEASE_TABLET | Freq: Every day | ORAL | Status: DC
  Administered 2023-04-22: 40 mg via ORAL
  Filled 2023-04-22: qty 1

## 2023-04-22 MED ORDER — KETOROLAC TROMETHAMINE 15 MG/ML IJ SOLN: 15.0000 mg | Freq: Once | INTRAMUSCULAR | Status: DC

## 2023-04-22 MED ORDER — KETOROLAC TROMETHAMINE 15 MG/ML IJ SOLN
15.0000 mg | Freq: Once | INTRAMUSCULAR | Status: AC
  Administered 2023-04-23: 15 mg via INTRAVENOUS
  Filled 2023-04-22: qty 1

## 2023-04-22 MED ORDER — COLCHICINE 0.6 MG PO TABS
0.6000 mg | ORAL_TABLET | Freq: Two times a day (BID) | ORAL | Status: DC
  Administered 2023-04-22 (×2): 0.6 mg via ORAL
  Filled 2023-04-22 (×2): qty 1

## 2023-04-22 MED ORDER — SODIUM CHLORIDE 0.9 % IV BOLUS
250.0000 mL | Freq: Once | INTRAVENOUS | Status: AC
  Administered 2023-04-22: 250 mL via INTRAVENOUS

## 2023-04-22 MED FILL — Fentanyl Citrate Preservative Free (PF) Inj 100 MCG/2ML: INTRAMUSCULAR | Qty: 2 | Status: AC

## 2023-04-22 MED FILL — Atropine Sulfate Soln Prefill Syr 1 MG/10ML (0.1 MG/ML): INTRAMUSCULAR | Qty: 10 | Status: AC

## 2023-04-22 MED FILL — Midazolam HCl Inj 5 MG/5ML (Base Equivalent): INTRAMUSCULAR | Qty: 15 | Status: AC

## 2023-04-22 NOTE — ED Notes (Signed)
ED TO INPATIENT HANDOFF REPORT  ED Nurse Name and Phone #: Hansel Starling [phone]  S Name/Age/Gender Jade [name] 62 y.o. female Room/Bed: 009C/009C  Code Status   Code Status: Full Code  Home/SNF/Other Home Patient oriented to: self, place, time, and situation Is this baseline? Yes   Triage Complete: Triage complete  Chief Complaint Pericarditis [I31.9]  Triage Note Pt. Stated, I had a cardiac ablation on Dec. 24 and ever since then Its been difficult to breath, its hard to breath especially when Im lying down.    Allergies Allergies  Allergen Reactions   Ciprofloxacin Anaphylaxis   Sulfa Antibiotics     hives    Level of Care/Admitting Diagnosis ED Disposition     ED Disposition  Admit   Condition  --   Comment  Hospital Area: [facility] [[id number]]  Level of Care: Telemetry Cardiac [103]  May place patient in observation at [facility] or [facility] if equivalent level of care is available:: No  Covid Evaluation: Asymptomatic - no recent exposure (last 10 days) testing not required  Diagnosis: Pericarditis [203047]  Admitting Physician: Nobie Putnam [[id number]]  Attending Physician: Nobie Putnam [[id number]]          B Medical/Surgery History Past Medical History:  Diagnosis Date   Anxiety    Asthma    uses inhaler prn   Chest pain    Colon polyps    Diarrhea    with constipation while taking diet pills (Alli)   History of rectal bleeding     2 times in past   HSV-2 (herpes simplex virus 2) infection    Palpitations    in past/due to Advil pm   Vitamin D deficiency    Past Surgical History:  Procedure Laterality Date   ABDOMINAL HYSTERECTOMY  1998   fibroids. partial   ATRIAL FIBRILLATION ABLATION N/A 04/20/2023   Procedure: ATRIAL FIBRILLATION ABLATION;  Surgeon: Nobie Putnam, MD;  Location: [facility] INVASIVE CV LAB;  Service: Cardiovascular;  Laterality: N/A;   CESAREAN SECTION     1 time   COLONOSCOPY W/ POLYPECTOMY  2017    RESECTOSCOPIC MYOMECTOMY       A IV Location/Drains/Wounds Patient Lines/Drains/Airways Status     Active Line/Drains/Airways     Name Placement date Placement time Site Days   Peripheral IV 04/22/23 20 G Left;Posterior Forearm 04/22/23  0838  Forearm  less than 1            Intake/Output Last 24 hours  Intake/Output Summary (Last 24 hours) at 04/22/2023 2017 Last data filed at 04/22/2023 1210 Gross per 24 hour  Intake 250 ml  Output --  Net 250 ml    Labs/Imaging Results for orders placed or performed during the hospital encounter of 04/22/23 (from the past 48 hours)  Basic metabolic panel     Status: None   Collection Time: 04/22/23  8:42 AM  Result Value Ref Range   Sodium 139 135 - 145 mmol/L   Potassium 3.8 3.5 - 5.1 mmol/L   Chloride 105 98 - 111 mmol/L   CO2 24 22 - 32 mmol/L   Glucose, Bld 92 70 - 99 mg/dL    Comment: Glucose reference range applies only to samples taken after fasting for at least 8 hours.   BUN 13 6 - 20 mg/dL   Creatinine, Ser 0.34 0.44 - 1.00 mg/dL   Calcium 9.0 8.9 - 74.2 mg/dL   GFR, Estimated >59 >56 mL/min    Comment: (NOTE) Calculated  using the CKD-EPI Creatinine Equation (2021)    Anion gap 10 5 - 15    Comment: Performed at [facility], [address], [place], [place] [address]  Brain natriuretic peptide     Status: None   Collection Time: 04/22/23  8:42 AM  Result Value Ref Range   B Natriuretic Peptide 49.3 0.0 - 100.0 pg/mL    Comment: Performed at [facility], [address] [place], [place] [address]  Troponin I (High Sensitivity)     Status: Abnormal   Collection Time: 04/22/23  8:42 AM  Result Value Ref Range   Troponin I (High Sensitivity) 5,316 (HH) <18 ng/L    Comment: REPEATED TO VERIFY CRITICAL RESULT CALLED TO, READ BACK BY AND VERIFIED WITH: S.BERTRAND,RN 1104 04/22/23 CLARK,S (NOTE) Elevated high sensitivity troponin I (hsTnI) values and significant  changes across serial measurements may  suggest ACS but many other  chronic and acute conditions are known to elevate hsTnI results.  Refer to the Links section for chest pain algorithms and additional  guidance. Performed at [facility], [address], [place], [place] [address]   CBC     Status: None   Collection Time: 04/22/23  8:42 AM  Result Value Ref Range   WBC 10.1 4.0 - 10.5 K/uL   RBC 4.24 3.87 - 5.11 MIL/uL   Hemoglobin 12.2 12.0 - 15.0 g/dL   HCT 62.9 52.8 - 41.3 %   MCV 87.5 80.0 - 100.0 fL   MCH 28.8 26.0 - 34.0 pg   MCHC 32.9 30.0 - 36.0 g/dL   RDW 24.4 01.0 - 27.2 %   Platelets 208 150 - 400 K/uL   nRBC 0.0 0.0 - 0.2 %    Comment: Performed at [facility], [address] [place], [place] [address]  Troponin I (High Sensitivity)     Status: Abnormal   Collection Time: 04/22/23 10:56 AM  Result Value Ref Range   Troponin I (High Sensitivity) 3,903 (HH) <18 ng/L    Comment: CRITICAL VALUE NOTED. VALUE IS CONSISTENT WITH PREVIOUSLY REPORTED/CALLED VALUE (NOTE) Elevated high sensitivity troponin I (hsTnI) values and significant  changes across serial measurements may suggest ACS but many other  chronic and acute conditions are known to elevate hsTnI results.  Refer to the "Links" section for chest pain algorithms and additional  guidance. Performed at [facility], [address], [place], [place] [address]   Sedimentation rate     Status: Abnormal   Collection Time: 04/22/23 10:56 AM  Result Value Ref Range   Sed Rate 24 (H) 0 - 22 mm/hr    Comment: Performed at [facility], [address], [place], [place] [address]  C-reactive protein     Status: None   Collection Time: 04/22/23 10:56 AM  Result Value Ref Range   CRP 0.5 <1.0 mg/dL    Comment: Performed at [facility], [address], [place], [place] [address]   ECHOCARDIOGRAM COMPLETE Result Date: 04/22/2023    ECHOCARDIOGRAM REPORT   Patient Name:   Jade [name] Date of Exam: 04/22/2023 Medical Rec #:   [id number]       Height:       66.0 in Accession #:    [id number]      Weight:       220.0 lb Date of Birth:  [birth year]       BSA:  2.082 m Patient Age:    62 years        BP:           92/58 mmHg Patient Gender: F               HR:           66 bpm. Exam Location:  Inpatient Procedure: 2D Echo, Cardiac Doppler and Color Doppler Indications:    Pericardial effusion I31.3  History:        Patient has prior history of Echocardiogram examinations, most                 recent 01/02/2021. Arrythmias:Atrial Fibrillation;                 Signs/Symptoms:Chest Pain.  Sonographer:    Darlys Gales Referring Phys: [id number] [facility] PFEIFFER IMPRESSIONS  1. There is no pericardial effusion detected.  2. Left ventricular ejection fraction, by estimation, is 55 to 60%. The left ventricle has normal function. The left ventricle has no regional wall motion abnormalities. Left ventricular diastolic parameters are indeterminate.  3. Right ventricular systolic function is normal. The right ventricular size is normal.  4. Left atrial size was mildly dilated.  5. The mitral valve is normal in structure. Trivial mitral valve regurgitation. No evidence of mitral stenosis.  6. The aortic valve was not well visualized. Aortic valve regurgitation is not visualized. No aortic stenosis is present.  7. The inferior vena cava is normal in size with greater than 50% respiratory variability, suggesting right atrial pressure of 3 mmHg. FINDINGS  Left Ventricle: Left ventricular ejection fraction, by estimation, is 55 to 60%. The left ventricle has normal function. The left ventricle has no regional wall motion abnormalities. The left ventricular internal cavity size was normal in size. There is  no left ventricular hypertrophy. Left ventricular diastolic parameters are indeterminate. Right Ventricle: The right ventricular size is normal. No increase in right ventricular wall thickness. Right ventricular systolic function is normal. Left Atrium:  Left atrial size was mildly dilated. Right Atrium: Right atrial size was normal in size. Pericardium: There is no evidence of pericardial effusion. Mitral Valve: The mitral valve is normal in structure. Mild mitral annular calcification. Trivial mitral valve regurgitation. No evidence of mitral valve stenosis. Tricuspid Valve: The tricuspid valve is normal in structure. Tricuspid valve regurgitation is not demonstrated. No evidence of tricuspid stenosis. Aortic Valve: The aortic valve was not well visualized. Aortic valve regurgitation is not visualized. No aortic stenosis is present. Aortic valve mean gradient measures 5.0 mmHg. Aortic valve peak gradient measures 7.8 mmHg. Aortic valve area, by VTI measures 2.00 cm. Pulmonic Valve: The pulmonic valve was normal in structure. Pulmonic valve regurgitation is not visualized. No evidence of pulmonic stenosis. Aorta: The aortic root is normal in size and structure. Venous: The inferior vena cava is normal in size with greater than 50% respiratory variability, suggesting right atrial pressure of 3 mmHg. IAS/Shunts: No atrial level shunt detected by color flow Doppler. Additional Comments: There is no pericardial effusion detected.  LEFT VENTRICLE PLAX 2D LVIDd:         4.40 cm   Diastology LVIDs:         3.20 cm   LV e' medial:    10.46 cm/s LV PW:         0.80 cm   LV E/e' medial:  8.6 LV IVS:        0.90 cm   LV e' lateral:  9.90 cm/s LVOT diam:     1.90 cm   LV E/e' lateral: 9.1 LV SV:         64 LV SV Index:   30 LVOT Area:     2.84 cm  RIGHT VENTRICLE RV S prime:     13.90 cm/s TAPSE (M-mode): 2.7 cm LEFT ATRIUM             Index        RIGHT ATRIUM           Index LA Vol (A2C):   81.6 ml 39.18 ml/m  RA Area:     16.50 cm LA Vol (A4C):   65.0 ml 31.21 ml/m  RA Volume:   39.60 ml  19.02 ml/m LA Biplane Vol: 73.9 ml 35.49 ml/m  AORTIC VALVE AV Area (Vmax):    2.05 cm AV Area (Vmean):   2.06 cm AV Area (VTI):     2.00 cm AV Vmax:           140.00 cm/s AV  Vmean:          110.000 cm/s AV VTI:            0.318 m AV Peak Grad:      7.8 mmHg AV Mean Grad:      5.0 mmHg LVOT Vmax:         101.00 cm/s LVOT Vmean:        79.900 cm/s LVOT VTI:          0.224 m LVOT/AV VTI ratio: 0.70  AORTA Ao Root diam: 2.80 cm Ao Asc diam:  3.20 cm MITRAL VALVE               TRICUSPID VALVE MV Area (PHT): 2.09 cm    TR Peak grad:   17.8 mmHg MV Decel Time: 363 msec    TR Vmax:        211.00 cm/s MV E velocity: 90.30 cm/s MV A velocity: 68.60 cm/s  SHUNTS MV E/A ratio:  1.32        Systemic VTI:  0.22 m                            Systemic Diam: 1.90 cm Donato Schultz MD Electronically signed by Donato Schultz MD Signature Date/Time: 04/22/2023/12:09:46 PM    Final    DG Chest 2 View Result Date: 04/22/2023 CLINICAL DATA:  Shortness of breath since cardiac ablation 2 days ago. EXAM: CHEST - 2 VIEW COMPARISON:  CT cardiac dated January 29, 2023. Chest x-ray dated May 02, 2019. FINDINGS: The heart size and mediastinal contours are within normal limits. Mild cephalization of the pulmonary vasculature. No focal consolidation, pleural effusion, or pneumothorax. No acute osseous abnormality. IMPRESSION: 1. Mild pulmonary vascular congestion. Electronically Signed   By: Obie Dredge M.D.   On: 04/22/2023 09:01    Pending Labs [employer] (From admission, onward)     Start     Ordered   Signed and Held  HIV Antibody (routine testing w rflx)  (HIV Antibody (Routine testing w reflex) panel)  Tomorrow morning,   R        Signed and Held   Signed and Held  CBC  Tomorrow morning,   R        Signed and Held   Signed and Held  Basic metabolic panel  Tomorrow morning,   R        Signed and  Held            Vitals/Pain Today's Vitals   04/22/23 1724 04/22/23 1815 04/22/23 1915 04/22/23 1916  BP:  93/65 (!) 84/61 (!) 98/59  Pulse:  75 77 83  Resp:  13    Temp: 98.1 F (36.7 C)     TempSrc: Oral     SpO2:  96% 96% 98%  PainSc:        Isolation Precautions No active  isolations  Medications Medications  colchicine tablet 0.6 mg (0.6 mg Oral Given 04/22/23 1332)  pantoprazole (PROTONIX) EC tablet 40 mg (has no administration in time range)  ketorolac (TORADOL) 15 MG/ML injection 15 mg (has no administration in time range)  apixaban (ELIQUIS) tablet 5 mg (has no administration in time range)  sodium chloride 0.9 % bolus 250 mL (0 mLs Intravenous Stopped 04/22/23 1210)  ketorolac (TORADOL) 15 MG/ML injection 15 mg (15 mg Intravenous Given 04/22/23 1732)    Mobility walks     Focused Assessments Cardiac Assessment Handoff:  Cardiac Rhythm: Normal sinus rhythm No results found for: "CKTOTAL", "CKMB", "CKMBINDEX", "TROPONINI" No results found for: "DDIMER" Does the Patient currently have chest pain? No    R Recommendations: See Admitting Provider Note  Report given to:   Additional Notes: Spouse at the bedside.

## 2023-04-22 NOTE — ED Notes (Signed)
Admitting provider at bedside.

## 2023-04-22 NOTE — ED Triage Notes (Signed)
Pt. Stated, I had a cardiac ablation on Dec. 24 and ever since then Its been difficult to breath, its hard to breath especially when Im lying down.

## 2023-04-22 NOTE — ED Provider Notes (Addendum)
EMERGENCY DEPARTMENT AT [facility] Provider Note   CSN: [id number] Arrival date & time: 04/22/23  4098     History  Chief Complaint  Patient presents with   Shortness of Breath    Jade [name] is a 62 y.o. female.  HPI Patient status post ablation for atrial fibrillation 12\24 with Dr. Jimmey Ralph.  Patient reports since then she has had chest pressure especially with any kind of reclined or recumbent position.  She reports it feels like a heavy pressure in the center of her chest.  No sharp pain, not significantly worse with deep inspiration.  Symptoms are much improved by sitting straight upright or standing.  Patient reports that she can ambulate without significant shortness of breath although with extended ambulation starts to get some shortness of breath.  No fever no cough.  No lower extremity swelling or calf pain.  No vomiting.    Home Medications Prior to Admission medications   Medication Sig Start Date End Date Taking? Authorizing Provider  albuterol (VENTOLIN HFA) 108 (90 Base) MCG/ACT inhaler Inhale into the lungs every 6 (six) hours as needed for wheezing or shortness of breath.   Yes [provider]  apixaban (ELIQUIS) 5 MG TABS tablet Take 1 tablet (5 mg total) by mouth 2 (two) times daily. 03/23/23  Yes Nobie Putnam, MD  metoprolol succinate (TOPROL-XL) 25 MG 24 hr tablet Take 25 mg by mouth daily.   Yes [provider]  valACYclovir (VALTREX) 500 MG tablet Take 1 tablet (500 mg total) by mouth daily. Patient taking differently: Take 500 mg by mouth daily as needed (outbreaks). 10/09/22  Yes Kuneff, Renee A, DO  sucralfate (CARAFATE) 1 g tablet Take 1 tablet (1 g total) by mouth 4 (four) times daily -  with meals and at bedtime. Patient not taking: Reported on 04/22/2023 04/09/23   Felix Pacini A, DO      Allergies    Ciprofloxacin and Sulfa antibiotics    Review of Systems   Review of Systems  Physical Exam Updated  Vital Signs BP 98/61   Pulse 76   Temp 98.2 F (36.8 C) (Oral)   Resp (!) 22   SpO2 100%  Physical Exam Constitutional:      Comments: Alert nontoxic well-nourished well-developed.  No respiratory distress at rest.  HENT:     Head: Normocephalic and atraumatic.     Mouth/Throat:     Pharynx: Oropharynx is clear.  Eyes:     Extraocular Movements: Extraocular movements intact.  Cardiovascular:     Rate and Rhythm: Normal rate and regular rhythm.     Heart sounds: No murmur heard.    No friction rub.  Pulmonary:     Effort: Pulmonary effort is normal.     Breath sounds: Normal breath sounds.  Abdominal:     General: There is no distension.     Palpations: Abdomen is soft.     Tenderness: There is no abdominal tenderness. There is no guarding.  Musculoskeletal:        General: No swelling or tenderness. Normal range of motion.     Right lower leg: No edema.     Left lower leg: No edema.  Skin:    General: Skin is warm and dry.  Neurological:     General: No focal deficit present.     Mental Status: She is oriented to person, place, and time.     Motor: No weakness.     Coordination: Coordination normal.  Psychiatric:        Mood and Affect: Mood normal.     ED Results / Procedures / Treatments   Labs (all labs ordered are listed, but only abnormal results are displayed) Labs Reviewed  TROPONIN I (HIGH SENSITIVITY) - Abnormal; Notable for the following components:      Result Value   Troponin I (High Sensitivity) 5,316 (*)    All other components within normal limits  BASIC METABOLIC PANEL  BRAIN NATRIURETIC PEPTIDE  CBC  SEDIMENTATION RATE  C-REACTIVE PROTEIN  TROPONIN I (HIGH SENSITIVITY)    EKG EKG Interpretation Date/Time:  Thursday April 22 2023 08:32:02 EST Ventricular Rate:  70 PR Interval:  139 QRS Duration:  95 QT Interval:  401 QTC Calculation: 433 R Axis:   76  Text Interpretation: Sinus rhythm Low voltage, precordial leads normal, no sig  change from previous Confirmed by Arby Barrette [phone]) on 04/22/2023 10:17:37 AM  Radiology DG Chest 2 View Result Date: 04/22/2023 CLINICAL DATA:  Shortness of breath since cardiac ablation 2 days ago. EXAM: CHEST - 2 VIEW COMPARISON:  CT cardiac dated January 29, 2023. Chest x-ray dated May 02, 2019. FINDINGS: The heart size and mediastinal contours are within normal limits. Mild cephalization of the pulmonary vasculature. No focal consolidation, pleural effusion, or pneumothorax. No acute osseous abnormality. IMPRESSION: 1. Mild pulmonary vascular congestion. Electronically Signed   By: Obie Dredge M.D.   On: 04/22/2023 09:01    Procedures Procedures   CRITICAL CARE Performed by: Arby Barrette   Total critical care time: 30 minutes  Critical care time was exclusive of separately billable procedures and treating other patients.  Critical care was necessary to treat or prevent imminent or life-threatening deterioration.  Critical care was time spent personally by me on the following activities: development of treatment plan with patient and/or surrogate as well as nursing, discussions with consultants, evaluation of patient's response to treatment, examination of patient, obtaining history from patient or surrogate, ordering and performing treatments and interventions, ordering and review of laboratory studies, ordering and review of radiographic studies, pulse oximetry and re-evaluation of patient's condition.  Medications Ordered in ED Medications  sodium chloride 0.9 % bolus 250 mL (250 mLs Intravenous New Bag/Given 04/22/23 1127)    ED Course/ Medical Decision Making/ A&P                                 Medical Decision Making Amount and/or Complexity of Data Reviewed Labs: ordered. Radiology: ordered.  Risk Decision regarding hospitalization.   Patient presents as outlined.  She is status post ablation for atrial fibrillation.  Patient has had persistent chest  pressure in recumbent position that improves with upright position.  Clinically well in appearance.  Current EKG shows sinus rhythm rate controlled without ischemic change.  Review of monitor shows narrow complex sinus rhythm rate in the 60s.  On examination patient has symmetric breath sounds and normal cardiac exam.  Differential diagnosis includes pneumothorax\pericardial effusion\cardiomyopathy\congestive heart failure\musculoskeletal pain will proceed with diagnostic evaluation and continued monitoring.  Currently patient does not require immediate intervention.  2 view chest x-ray reviewed by myself personally and radiology review.  No mediastinal widening no pneumothorax.  Radiology suggests mild vascular congestion.  Consult: 10: 30 reviewed with Dr. Isabell Jarvis cardiology fellow.  Discussed diagnostic evaluation, recommends CRP and ESR for possible pericarditis.  We discussed echocardiogram to rule out pericardial effusion or other structural anomaly.  Will proceed with echocardiogram.  Troponin returned at 5000.  Echocardiogram completed.  I have visually reviewed this and do not appreciate a pericardial effusion.  Consult 11: 35 with Dr. Yehuda Savannah cardiology fellow, he has also visually reviewed echo and does not identify immediate gross abnormality.  Cardiology will do consultation in the emergency department.  Discussed occasional soft blood pressures 90s to mid 80s.  Will administer a to 50 cc normal saline bolus and continue to observe.  Patient's oxygen saturations remain at 100%.  Heart rate remains controlled in sinus rhythm 60s to 70s.  Patient is anticoagulated and compliant with Eliquis.  She took her last dose of Eliquis this morning at approximately 9 AM.  Due to low blood pressures, patient did not take her morning metoprolol dose.  Last dose of metoprolol was yesterday evening.  PE seems unlikely in the setting of anticoagulation with normal oxygen saturation and symptoms less  pronounced standing and walking with exertion, no lower extremity swelling or calf pain.  This appears most likely to be myocarditis or pericarditis in setting of recent ablation.  Will continue to observe closely and await cardiology consultation and anticipated admission.  Patient continues reports she feels fine if she is sitting upright.  She does not feel dyspneic or significant chest pain at rest in upright position.   Final Clinical Impression(s) / ED Diagnoses Final diagnoses:  Troponin level elevated  Status post ablation of atrial fibrillation  Chest pain, unspecified type    Rx / DC Orders ED Discharge Orders     None         Arby Barrette, MD 04/22/23 1141    Arby Barrette, MD 04/22/23 1143

## 2023-04-22 NOTE — Progress Notes (Addendum)
Admit orders written per Dr. Lavone Neri instructions (placing in observation). He recommends another dose of Toradol around midnight, continue Eliquis, continue new colchicine, add PPI and holding metoprolol.

## 2023-04-22 NOTE — H&P (Signed)
Cardiology Admission History and Physical   Patient ID: Jade [name] MRN: [id number]; DOB: 1960-08-12   Admission date: 04/22/2023  PCP:  Natalia Leatherwood, DO   [facility] Providers Cardiologist:  Reatha Harps, MD  Electrophysiologist:  Nobie Putnam, MD       Chief Complaint:  Chest tightness  Patient Profile:   Jade [name] is a 62 y.o. female with PAF and PACs who is being seen 04/22/2023 for the evaluation of chest tightness.  History of Present Illness:   Jade [name] is a pleasant 62 year old female with past medical history of PAF and PACs.  Her CHA2DS2-VASc score is 1 for female.  Patient was seen by Dr. Flora Lipps in September 2024 for complaints of daily episode of palpitation shortness of breath in the chest discomfort.  Echocardiogram obtained on 01/03/2023 showed EF 60 to 65%, no regional wall motion abnormality, normal RV, RVSP 23.2 mmHg, mild MR.  Coronary CT obtained on 01/29/2023 demonstrated coronary calcium score was 0, normal coronary arteries, no significant extracardiac finding.  ZIO monitor showed 4% A-fib burden, 3% PAC burden, Mobitz 1 block. Her chest pain was felt to be related to underlying atrial fibrillation.  She was started on Eliquis, metoprolol and flecainide.  ETT performed on 02/14/2023 was normal without QRS widening.  However later we found out patient never took the flecainide after feeling on the medication, therefore ETT was performed off of flecainide.  She had fatigue on metoprolol.  Given significant symptom associated with atrial fibrillation, she ultimately underwent ablation procedure by Dr. Jimmey Ralph on 04/20/2023.  She had trivial pericardial effusion during intracardiac echo.  Eliquis resumed 2 hours after the procedure.  Initial plan post ablation is to continue metoprolol for few weeks and Eliquis for 3 months after his ablation.   Since the procedure, patient complained of tightness in the chest that is noticeable when she tried  to lay flat and goes away when she sit upright.  She denies any chest discomfort with deep inspiration or palpation.  This has caused significant anxiety for her for the past 2 days.  She eventually sought medical attention this morning at [facility] ED.  Blood pressure on arrival was 107/72.  However dropped down to 86/63 in the emergency room.  O2 saturation 100%.  Patient was afebrile.  Serial troponin elevated at 5316--> 3903.  BNP 49.3.  Basic metabolic panel and a CBC were normal.  CRP borderline elevated at 0.5.  ESR 24.  Chest x-ray showed mild pulmonary vascular congestion. EKG showed sinus bradycardia, no significant ST-T wave changes.  Echocardiogram obtained on 04/22/2023 EF 55 to 60%, no regional wall motion abnormality, trivial MR.    Past Medical History:  Diagnosis Date   Anxiety    Asthma    uses inhaler prn   Chest pain    Colon polyps    Diarrhea    with constipation while taking diet pills (Alli)   History of rectal bleeding     2 times in past   HSV-2 (herpes simplex virus 2) infection    Palpitations    in past/due to Advil pm   Vitamin D deficiency     Past Surgical History:  Procedure Laterality Date   ABDOMINAL HYSTERECTOMY  1998   fibroids. partial   ATRIAL FIBRILLATION ABLATION N/A 04/20/2023   Procedure: ATRIAL FIBRILLATION ABLATION;  Surgeon: Nobie Putnam, MD;  Location: [facility] INVASIVE CV LAB;  Service: Cardiovascular;  Laterality: N/A;   CESAREAN SECTION  1 time   COLONOSCOPY W/ POLYPECTOMY  2017   RESECTOSCOPIC MYOMECTOMY       Medications Prior to Admission: Prior to Admission medications   Medication Sig Start Date End Date Taking? Authorizing Provider  albuterol (VENTOLIN HFA) 108 (90 Base) MCG/ACT inhaler Inhale into the lungs every 6 (six) hours as needed for wheezing or shortness of breath.   Yes [provider]  apixaban (ELIQUIS) 5 MG TABS tablet Take 1 tablet (5 mg total) by mouth 2 (two) times daily. 03/23/23  Yes Nobie Putnam, MD  metoprolol succinate (TOPROL-XL) 25 MG 24 hr tablet Take 25 mg by mouth daily.   Yes [provider]  valACYclovir (VALTREX) 500 MG tablet Take 1 tablet (500 mg total) by mouth daily. Patient taking differently: Take 500 mg by mouth daily as needed (outbreaks). 10/09/22  Yes Kuneff, Renee A, DO  sucralfate (CARAFATE) 1 g tablet Take 1 tablet (1 g total) by mouth 4 (four) times daily -  with meals and at bedtime. Patient not taking: Reported on 04/22/2023 04/09/23   Felix Pacini A, DO     Allergies:    Allergies  Allergen Reactions   Ciprofloxacin Anaphylaxis   Sulfa Antibiotics     hives    Social History:   Social History   Socioeconomic History   Marital status: Married    Spouse name: Not on file   Number of children: 1   Years of education: Not on file   Highest education level: Not on file  Occupational History   Occupation: Armed forces operational officer  Tobacco Use   Smoking status: Never   Smokeless tobacco: Never  Vaping Use   Vaping status: Never Used  Substance and Sexual Activity   Alcohol use: Not Currently    Alcohol/week: 1.0 - 2.0 standard drink of alcohol    Types: 1 - 2 Glasses of wine per week    Comment: occ   Drug use: No   Sexual activity: Yes    Partners: Male    Birth control/protection: Surgical    Comment: DECLINED INSURANCE QUESTIONS,DES NEG  Other Topics Concern   Not on file  Social History Narrative   Married. One child.   College-educated, works as a Armed forces operational officer.   Smoke alarm in the home. Wears her seatbelt.   Feels safe in her relationship.   Social Drivers of Corporate investment banker Strain: Not on file  Food Insecurity: No Food Insecurity (04/22/2023)   Hunger Vital Sign    Worried About Running Out of Food in the Last Year: Never true    Ran Out of Food in the Last Year: Never true  Transportation Needs: No Transportation Needs (04/22/2023)   PRAPARE - Administrator, Civil Service (Medical): No     Lack of Transportation (Non-Medical): No  Physical Activity: Not on file  Stress: Not on file  Social Connections: Unknown (08/26/2021)   Received from [facility]   Social Network    Social Network: Not on file  Intimate Partner Violence: Not At Risk (04/22/2023)   Humiliation, Afraid, Rape, and Kick questionnaire    Fear of Current or Ex-Partner: No    Emotionally Abused: No    Physically Abused: No    Sexually Abused: No    Family History:   The patient's family history includes Asthma in her mother; Atrial fibrillation in her brother and sister; Diabetes in her father and paternal uncle; Heart disease in her father; Hypertension  in her father; Lung cancer in her paternal grandfather; Prostate cancer in her father; Thyroid disease in her sister. There is no history of Colon cancer, Colon polyps, Rectal cancer, Stomach cancer, or Esophageal cancer.    ROS:  Please see the history of present illness.  All other ROS reviewed and negative.     Physical Exam/Data:   Vitals:   04/22/23 1145 04/22/23 1200 04/22/23 1238 04/22/23 1245  BP: 90/61 109/68  106/66  Pulse: 63 71  65  Resp: 14 14  18   Temp:   98.4 F (36.9 C)   TempSrc:   Oral   SpO2: 100% 100%  100%    Intake/Output Summary (Last 24 hours) at 04/22/2023 1328 Last data filed at 04/22/2023 1210 Gross per 24 hour  Intake 250 ml  Output --  Net 250 ml      04/20/2023    5:47 AM 04/15/2023    1:48 PM 04/09/2023   11:42 AM  Last 3 Weights  Weight (lbs) 220 lb 221 lb 221 lb 6.4 oz  Weight (kg) 99.791 kg 100.245 kg 100.426 kg     There is no height or weight on file to calculate BMI.  General:  Well nourished, well developed, in no acute distress HEENT: normal Neck: no JVD Vascular: No carotid bruits; Distal pulses 2+ bilaterally   Cardiac:  normal S1, S2; RRR; no murmur  Lungs:  clear to auscultation bilaterally, no wheezing, rhonchi or rales  Abd: soft, nontender, no hepatomegaly  Ext: no  edema Musculoskeletal:  No deformities, BUE and BLE strength normal and equal Skin: warm and dry  Neuro:  CNs 2-12 intact, no focal abnormalities noted Psych:  Normal affect    EKG:  The ECG that was done and was personally reviewed and demonstrates NSR without significant ST-T wave changes  Relevant CV Studies:  Coronary CT 01/29/2023 IMPRESSION: 1. Coronary artery calcium score 0 Agatston units. This suggests low risk for future cardiac events.   2.  No significant coronary artery disease noted.   Echo 04/22/2023  1. There is no pericardial effusion detected.   2. Left ventricular ejection fraction, by estimation, is 55 to 60%. The  left ventricle has normal function. The left ventricle has no regional  wall motion abnormalities. Left ventricular diastolic parameters are  indeterminate.   3. Right ventricular systolic function is normal. The right ventricular  size is normal.   4. Left atrial size was mildly dilated.   5. The mitral valve is normal in structure. Trivial mitral valve  regurgitation. No evidence of mitral stenosis.   6. The aortic valve was not well visualized. Aortic valve regurgitation  is not visualized. No aortic stenosis is present.   7. The inferior vena cava is normal in size with greater than 50%  respiratory variability, suggesting right atrial pressure of 3 mmHg.   Laboratory Data:  High Sensitivity Troponin:   Recent Labs  Lab 04/22/23 0842 04/22/23 1056  TROPONINIHS 5,316* 3,903*      Chemistry Recent Labs  Lab 04/22/23 0842  NA 139  K 3.8  CL 105  CO2 24  GLUCOSE 92  BUN 13  CREATININE 0.63  CALCIUM 9.0  GFRNONAA >60  ANIONGAP 10    No results for input(s): "PROT", "ALBUMIN", "AST", "ALT", "ALKPHOS", "BILITOT" in the last 168 hours. Lipids No results for input(s): "CHOL", "TRIG", "HDL", "LABVLDL", "LDLCALC", "CHOLHDL" in the last 168 hours. Hematology Recent Labs  Lab 04/22/23 0842  WBC 10.1  RBC 4.24  HGB 12.2  HCT 37.1   MCV 87.5  MCH 28.8  MCHC 32.9  RDW 13.8  PLT 208   Thyroid No results for input(s): "TSH", "FREET4" in the last 168 hours. BNP Recent Labs  Lab 04/22/23 0842  BNP 49.3    DDimer No results for input(s): "DDIMER" in the last 168 hours.   Radiology/Studies:  ECHOCARDIOGRAM COMPLETE Result Date: 04/22/2023    ECHOCARDIOGRAM REPORT   Patient Name:   Jade [name] Date of Exam: 04/22/2023 Medical Rec #:  [id number]       Height:       66.0 in Accession #:    [id number]      Weight:       220.0 lb Date of Birth:  1960/11/13       BSA:          2.082 m Patient Age:    62 years        BP:           92/58 mmHg Patient Gender: F               HR:           66 bpm. Exam Location:  Inpatient Procedure: 2D Echo, Cardiac Doppler and Color Doppler Indications:    Pericardial effusion I31.3  History:        Patient has prior history of Echocardiogram examinations, most                 recent 01/02/2021. Arrythmias:Atrial Fibrillation;                 Signs/Symptoms:Chest Pain.  Sonographer:    Darlys Gales Referring Phys: [id number] [facility] PFEIFFER IMPRESSIONS  1. There is no pericardial effusion detected.  2. Left ventricular ejection fraction, by estimation, is 55 to 60%. The left ventricle has normal function. The left ventricle has no regional wall motion abnormalities. Left ventricular diastolic parameters are indeterminate.  3. Right ventricular systolic function is normal. The right ventricular size is normal.  4. Left atrial size was mildly dilated.  5. The mitral valve is normal in structure. Trivial mitral valve regurgitation. No evidence of mitral stenosis.  6. The aortic valve was not well visualized. Aortic valve regurgitation is not visualized. No aortic stenosis is present.  7. The inferior vena cava is normal in size with greater than 50% respiratory variability, suggesting right atrial pressure of 3 mmHg. FINDINGS  Left Ventricle: Left ventricular ejection fraction, by estimation, is 55 to 60%. The  left ventricle has normal function. The left ventricle has no regional wall motion abnormalities. The left ventricular internal cavity size was normal in size. There is  no left ventricular hypertrophy. Left ventricular diastolic parameters are indeterminate. Right Ventricle: The right ventricular size is normal. No increase in right ventricular wall thickness. Right ventricular systolic function is normal. Left Atrium: Left atrial size was mildly dilated. Right Atrium: Right atrial size was normal in size. Pericardium: There is no evidence of pericardial effusion. Mitral Valve: The mitral valve is normal in structure. Mild mitral annular calcification. Trivial mitral valve regurgitation. No evidence of mitral valve stenosis. Tricuspid Valve: The tricuspid valve is normal in structure. Tricuspid valve regurgitation is not demonstrated. No evidence of tricuspid stenosis. Aortic Valve: The aortic valve was not well visualized. Aortic valve regurgitation is not visualized. No aortic stenosis is present. Aortic valve mean gradient measures 5.0 mmHg. Aortic valve peak gradient measures 7.8 mmHg. Aortic valve area, by VTI measures 2.00 cm.  Pulmonic Valve: The pulmonic valve was normal in structure. Pulmonic valve regurgitation is not visualized. No evidence of pulmonic stenosis. Aorta: The aortic root is normal in size and structure. Venous: The inferior vena cava is normal in size with greater than 50% respiratory variability, suggesting right atrial pressure of 3 mmHg. IAS/Shunts: No atrial level shunt detected by color flow Doppler. Additional Comments: There is no pericardial effusion detected.  LEFT VENTRICLE PLAX 2D LVIDd:         4.40 cm   Diastology LVIDs:         3.20 cm   LV e' medial:    10.46 cm/s LV PW:         0.80 cm   LV E/e' medial:  8.6 LV IVS:        0.90 cm   LV e' lateral:   9.90 cm/s LVOT diam:     1.90 cm   LV E/e' lateral: 9.1 LV SV:         64 LV SV Index:   30 LVOT Area:     2.84 cm  RIGHT  VENTRICLE RV S prime:     13.90 cm/s TAPSE (M-mode): 2.7 cm LEFT ATRIUM             Index        RIGHT ATRIUM           Index LA Vol (A2C):   81.6 ml 39.18 ml/m  RA Area:     16.50 cm LA Vol (A4C):   65.0 ml 31.21 ml/m  RA Volume:   39.60 ml  19.02 ml/m LA Biplane Vol: 73.9 ml 35.49 ml/m  AORTIC VALVE AV Area (Vmax):    2.05 cm AV Area (Vmean):   2.06 cm AV Area (VTI):     2.00 cm AV Vmax:           140.00 cm/s AV Vmean:          110.000 cm/s AV VTI:            0.318 m AV Peak Grad:      7.8 mmHg AV Mean Grad:      5.0 mmHg LVOT Vmax:         101.00 cm/s LVOT Vmean:        79.900 cm/s LVOT VTI:          0.224 m LVOT/AV VTI ratio: 0.70  AORTA Ao Root diam: 2.80 cm Ao Asc diam:  3.20 cm MITRAL VALVE               TRICUSPID VALVE MV Area (PHT): 2.09 cm    TR Peak grad:   17.8 mmHg MV Decel Time: 363 msec    TR Vmax:        211.00 cm/s MV E velocity: 90.30 cm/s MV A velocity: 68.60 cm/s  SHUNTS MV E/A ratio:  1.32        Systemic VTI:  0.22 m                            Systemic Diam: 1.90 cm Jade Schultz MD Electronically signed by Jade Schultz MD Signature Date/Time: 04/22/2023/12:09:46 PM    Final    DG Chest 2 View Result Date: 04/22/2023 CLINICAL DATA:  Shortness of breath since cardiac ablation 2 days ago. EXAM: CHEST - 2 VIEW COMPARISON:  CT cardiac dated January 29, 2023. Chest x-ray dated May 02, 2019. FINDINGS: The heart size  and mediastinal contours are within normal limits. Mild cephalization of the pulmonary vasculature. No focal consolidation, pleural effusion, or pneumothorax. No acute osseous abnormality. IMPRESSION: 1. Mild pulmonary vascular congestion. Electronically Signed   By: Obie Dredge M.D.   On: 04/22/2023 09:01     Assessment and Plan:   Chest pain with elevated troponin  - Serial hs trop 5316 --> 3903 - No significant EKG changes. Chest pain worse with laying down, resolve when sitting up. 8-9/10 in degree while laying down. Denies any chest pain with deep inspiration  and palpation - will start on colchicine 0.6mg  BID. Unable to do NSAID with Eliquis. D/C metoprolol given borderline hypotension.   PAF s/p ablation: maintaining NSR, continue on Eliquis    Risk Assessment/Risk Scores:     CHA2DS2-VASc Score = 1   This indicates a 0.6% annual risk of stroke. The patient's score is based upon: CHF History: 0 HTN History: 0 Diabetes History: 0 Stroke History: 0 Vascular Disease History: 0 Age Score: 0 Gender Score: 1      Code Status: Full Code  Severity of Illness: The appropriate patient status for this patient is OBSERVATION. Observation status is judged to be reasonable and necessary in order to provide the required intensity of service to ensure the patient's safety. The patient's presenting symptoms, physical exam findings, and initial radiographic and laboratory data in the context of their medical condition is felt to place them at decreased risk for further clinical deterioration. Furthermore, it is anticipated that the patient will be medically stable for discharge from the hospital within 2 midnights of admission.    For questions or updates, please contact [facility] Please consult [email or web address] for contact info under     Signed, Azalee Course, PA  04/22/2023 1:28 PM   I have seen, examined the patient, and reviewed the above assessment and plan.    HPI: Anaalicia Martinsen is a 62 year old female with a past medical history notable for paroxysmal atrial fibrillation. She underwent PVI and posterior wall ablation on 04/19/25. She presented to the ED today with chest pressure since the ablation. This is accompanied by shortness of breath. Symptoms are present when laying flat and resolve with sitting upright. She is otherwise doing relatively well.   GEN: No acute distress.   Cardiac: Normal rate, regular rhythm Resp: Normal work of breathing  Psych: Normal affect   EKG 04/22/23:   Echo 04/22/23:   1. There is no  pericardial effusion detected.   2. Left ventricular ejection fraction, by estimation, is 55 to 60%. The  left ventricle has normal function. The left ventricle has no regional  wall motion abnormalities. Left ventricular diastolic parameters are  indeterminate.   3. Right ventricular systolic function is normal. The right ventricular  size is normal.   4. Left atrial size was mildly dilated.   5. The mitral valve is normal in structure. Trivial mitral valve  regurgitation. No evidence of mitral stenosis.   6. The aortic valve was not well visualized. Aortic valve regurgitation  is not visualized. No aortic stenosis is present.   7. The inferior vena cava is normal in size with greater than 50%  respiratory variability, suggesting right atrial pressure of 3 mmHg.   Assessment: Jahida Smyth is a 62 year old female with a past medical history notable for paroxysmal atrial fibrillation who underwent catheter ablation on 12/23. She now presents with chest pressure and shortness of breath when laying flat. Resolved sitting upright.  She has a normal BNP and does not appear significantly volume overloaded. Troponin elevated, which is expected post ablation, and downtrending. Normal coronary CTA in October 2024. Echocardiogram was performed and showed normal LVEF and no pericardial effusion. Hemoglobin stable. The best explanation for her symptoms appears to be post-ablation pericardial inflammation.   Problem List:  Post-ablation pericarditis   Plan: - IV toradol for pain and anti-inflammatory properties. - Start colchicine. - Stop metoprolol.  - Continue Eliquis.   Nobie Putnam, MD 04/22/2023 9:46 PM

## 2023-04-23 DIAGNOSIS — I308 Other forms of acute pericarditis: Secondary | ICD-10-CM | POA: Diagnosis not present

## 2023-04-23 LAB — CBC
HCT: 34.9 % — ABNORMAL LOW (ref 36.0–46.0)
Hemoglobin: 11.4 g/dL — ABNORMAL LOW (ref 12.0–15.0)
MCH: 29 pg (ref 26.0–34.0)
MCHC: 32.7 g/dL (ref 30.0–36.0)
MCV: 88.8 fL (ref 80.0–100.0)
Platelets: 208 10*3/uL (ref 150–400)
RBC: 3.93 MIL/uL (ref 3.87–5.11)
RDW: 13.5 % (ref 11.5–15.5)
WBC: 6.7 10*3/uL (ref 4.0–10.5)
nRBC: 0 % (ref 0.0–0.2)

## 2023-04-23 LAB — BASIC METABOLIC PANEL
Anion gap: 8 (ref 5–15)
BUN: 15 mg/dL (ref 6–20)
CO2: 24 mmol/L (ref 22–32)
Calcium: 8.9 mg/dL (ref 8.9–10.3)
Chloride: 108 mmol/L (ref 98–111)
Creatinine, Ser: 0.68 mg/dL (ref 0.44–1.00)
GFR, Estimated: >60 mL/min (ref >60)
Glucose, Bld: 118 mg/dL — ABNORMAL HIGH (ref 70–99)
Potassium: 3.5 mmol/L (ref 3.5–5.1)
Sodium: 140 mmol/L (ref 135–145)

## 2023-04-23 MED ORDER — SODIUM CHLORIDE 0.9% FLUSH: 3.0000 mL | Freq: Two times a day (BID) | INTRAVENOUS | Status: DC

## 2023-04-23 MED ORDER — COLCHICINE 0.6 MG PO TABS
0.6000 mg | ORAL_TABLET | Freq: Two times a day (BID) | ORAL | Status: DC
  Administered 2023-04-23: 0.6 mg via ORAL
  Filled 2023-04-23: qty 1

## 2023-04-23 MED ORDER — SODIUM CHLORIDE 0.9% FLUSH: 3.0000 mL | INTRAVENOUS | Status: DC | PRN

## 2023-04-23 MED ORDER — SODIUM CHLORIDE 0.9 % IV SOLN: 250.0000 mL | INTRAVENOUS | Status: DC | PRN

## 2023-04-23 MED ORDER — APIXABAN 5 MG PO TABS
5.0000 mg | ORAL_TABLET | Freq: Two times a day (BID) | ORAL | Status: DC
  Administered 2023-04-23: 5 mg via ORAL
  Filled 2023-04-23: qty 1

## 2023-04-23 MED ORDER — COLCHICINE 0.6 MG PO TABS: 0.6000 mg | ORAL_TABLET | Freq: Every day | ORAL | 0 refills | Status: DC

## 2023-04-23 NOTE — Discharge Summary (Addendum)
    ELECTROPHYSIOLOGY DISCHARGE SUMMARY    Patient ID: Jade [name],  MRN: [id number], DOB/AGE: 1961-02-07 62 y.o.  Admit date: 04/22/2023 Discharge date: 04/23/2023  Primary Care Physician: Jade Leatherwood, DO  Primary Cardiologist: Jade Harps, MD  Electrophysiologist: Dr. Jimmey [name]   Primary Discharge Diagnosis:  Pericarditis  Secondary Discharge Diagnosis:  PAF s/p ablation PACs  Procedures This Admission:  Echo 04/22/2023 LVEF 55-60%, mild LAE, no pericardial effusion noted.   Brief HPI: Jade [name] is a 62 y.o. female with a history of PAF and PACs who underwent AF ablation on 04/20/2023 who presented with SOB and chest heaviness when lying flat.   Hospital Course:  The patient was admitted as above.  In the setting of overall normal labwork and expected HS troponin elevation s/p ablation, felt to be pericarditic changes. Pt started on colchicine with doses of Toradol up front which helped her symptoms. She was able to sleep and rest without significant discomfort. They were monitored on telemetry overnight which demonstrated NSR.  The patient was examined and considered to be stable for discharge..  The patient will be seen back by [facility] in 4 weeks as scheduled, and will call back with any further concerns or questions.   Pt will take Colchicine 0.6 mg daily x 1 month.  Toprol stopped with soft pressures and NSR s/p ablation.   Physical Exam: Vitals:   04/23/23 0445 04/23/23 0545 04/23/23 0630 04/23/23 0733  BP: 100/69 97/63 (!) 88/57   Pulse: 69 66 81   Resp: 15 17 11    Temp:    97.6 F (36.4 C)  TempSrc:    Oral  SpO2: 98% 98% 97%     GEN- NAD. A&O x 3.  HEENT: Normocephalic, atraumatic Lungs- CTAB, Normal effort.  Heart- RRR, No M/G/R.  GI- Soft, NT, ND.  Extremities- No clubbing, cyanosis, or edema; Groin without hematoma   Discharge Medications:  Allergies as of 04/23/2023       Reactions   Ciprofloxacin Anaphylaxis   Sulfa  Antibiotics    hives        Medication List     STOP taking these medications    metoprolol succinate 25 MG 24 hr tablet Commonly known as: TOPROL-XL       TAKE these medications    albuterol 108 (90 Base) MCG/ACT inhaler Commonly known as: VENTOLIN HFA Inhale into the lungs every 6 (six) hours as needed for wheezing or shortness of breath.   apixaban 5 MG Tabs tablet Commonly known as: ELIQUIS Take 1 tablet (5 mg total) by mouth 2 (two) times daily.   colchicine 0.6 MG tablet Take 1 tablet (0.6 mg total) by mouth daily.   sucralfate 1 g tablet Commonly known as: Carafate Take 1 tablet (1 g total) by mouth 4 (four) times daily -  with meals and at bedtime.   valACYclovir 500 MG tablet Commonly known as: VALTREX Take 1 tablet (500 mg total) by mouth daily. What changed:  when to take this reasons to take this        Disposition: Home with usual follow up as in AVS  Duration of Discharge Encounter: Greater than 30 minutes including physician time.  Dustin Flock, PA-C  04/23/2023 8:05 AM

## 2023-04-25 ENCOUNTER — Telehealth: Payer: Self-pay | Admitting: Student in an Organized Health Care Education/Training Program

## 2023-04-25 ENCOUNTER — Telehealth: Payer: Self-pay | Admitting: Cardiology

## 2023-04-25 NOTE — Telephone Encounter (Signed)
Outpatient service line follow-up:  Just following up after Dr. Cherly Beach had spoken with the patient today and had prescribed ibuprofen 800 mg 3 times daily.  She said she had some difficulty breathing that was not relieved when sitting forward however now feels comfortable and without any further complaints.  We discussed ER precautions and monitoring of vital signs.  If she were to develop significant symptoms or abnormal vital signs could consider limited echocardiogram urgently either outpatient or in the emergency room.  Likely not to occur on appropriate therapy but can consider in the future.  She has instructions to call outpatient service line or schedule urgent follow-up depending on severity of symptoms, she's agreeable to plan.

## 2023-04-25 NOTE — Telephone Encounter (Signed)
Spoke with Ms. Jade [name] who is s/p recent PVI+PWI with Jade [name] on 04/20/23 with post procedure course c/b severe CP thought to be related to post procedure pericarditis. Colchicine prescribed and pain dramatically improved. Unfortunately now with subacute onset dyspnea at rest and exertion not associated with pain without positional change. Asked her to start ibuprofen 800 mg tid in addition to colchicine 0.6 mg bid. The other consideration is whether she is developing a pericardial effusion related to pericarditis with the dyspnea without associated CP. She is fine trying to combo ibuprofen/colchicine today. If worsening and/or no better tomorrow then will need to arrange for limited TTE to ensure no accumulating fluid. Discussed with day team and they will call her this afternoon to check in. Also some contribution of anxiety which she acknowledges could be a driver of her sx. Either way will f/u with her later today/tomorrow and make sure things are going in the right direction. If pericarditis she understands it can take days to weeks to fully resolve.

## 2023-04-25 NOTE — Anesthesia Postprocedure Evaluation (Signed)
Anesthesia Post Note  Patient: Jade [name]  Procedure(s) Performed: ATRIAL FIBRILLATION ABLATION     Patient location during evaluation: Cath Lab Anesthesia Type: General Level of consciousness: awake and alert Pain management: pain level controlled Vital Signs Assessment: post-procedure vital signs reviewed and stable Respiratory status: spontaneous breathing, nonlabored ventilation and respiratory function stable Cardiovascular status: blood pressure returned to baseline and stable Postop Assessment: no apparent nausea or vomiting Anesthetic complications: no   There were no known notable events for this encounter.  Last Vitals:  Vitals:   04/20/23 1330 04/20/23 1340  BP: 99/60 102/67  Pulse: 76   Resp: 17   Temp:    SpO2: 97%     Last Pain:  Vitals:   04/20/23 1330  TempSrc:   PainSc: 2    Pain Goal:                   Karesha Trzcinski

## 2023-04-27 ENCOUNTER — Emergency Department (HOSPITAL_COMMUNITY)
Admission: EM | Admit: 2023-04-27 | Discharge: 2023-04-27 | Disposition: A | Discharge status: Home / Self Care | Payer: BC Managed Care – PPO | Attending: Emergency Medicine | Admitting: Emergency Medicine

## 2023-04-27 ENCOUNTER — Emergency Department (HOSPITAL_BASED_OUTPATIENT_CLINIC_OR_DEPARTMENT_OTHER): Payer: BC Managed Care – PPO

## 2023-04-27 ENCOUNTER — Other Ambulatory Visit: Payer: Self-pay

## 2023-04-27 ENCOUNTER — Emergency Department (HOSPITAL_COMMUNITY): Payer: BC Managed Care – PPO

## 2023-04-27 ENCOUNTER — Telehealth: Payer: Self-pay | Admitting: Cardiology

## 2023-04-27 ENCOUNTER — Encounter (HOSPITAL_COMMUNITY): Payer: Self-pay

## 2023-04-27 DIAGNOSIS — Z20822 Contact with and (suspected) exposure to covid-19: Secondary | ICD-10-CM | POA: Diagnosis not present

## 2023-04-27 DIAGNOSIS — R0602 Shortness of breath: Secondary | ICD-10-CM | POA: Insufficient documentation

## 2023-04-27 DIAGNOSIS — Z7901 Long term (current) use of anticoagulants: Secondary | ICD-10-CM | POA: Diagnosis not present

## 2023-04-27 DIAGNOSIS — R918 Other nonspecific abnormal finding of lung field: Secondary | ICD-10-CM | POA: Diagnosis not present

## 2023-04-27 DIAGNOSIS — R0789 Other chest pain: Secondary | ICD-10-CM | POA: Insufficient documentation

## 2023-04-27 DIAGNOSIS — I4891 Unspecified atrial fibrillation: Secondary | ICD-10-CM | POA: Diagnosis not present

## 2023-04-27 DIAGNOSIS — I3139 Other pericardial effusion (noninflammatory): Secondary | ICD-10-CM | POA: Diagnosis not present

## 2023-04-27 LAB — CBC
HCT: 37.7 % (ref 36.0–46.0)
Hemoglobin: 12.5 g/dL (ref 12.0–15.0)
MCH: 29.1 pg (ref 26.0–34.0)
MCHC: 33.2 g/dL (ref 30.0–36.0)
MCV: 87.9 fL (ref 80.0–100.0)
Platelets: 263 10*3/uL (ref 150–400)
RBC: 4.29 MIL/uL (ref 3.87–5.11)
RDW: 13.2 % (ref 11.5–15.5)
WBC: 8.2 10*3/uL (ref 4.0–10.5)
nRBC: 0 % (ref 0.0–0.2)

## 2023-04-27 LAB — BASIC METABOLIC PANEL
Anion gap: 12 (ref 5–15)
BUN: 16 mg/dL (ref 6–20)
CO2: 22 mmol/L (ref 22–32)
Calcium: 9.3 mg/dL (ref 8.9–10.3)
Chloride: 106 mmol/L (ref 98–111)
Creatinine, Ser: 0.66 mg/dL (ref 0.44–1.00)
GFR, Estimated: >60 mL/min (ref >60)
Glucose, Bld: 118 mg/dL — ABNORMAL HIGH (ref 70–99)
Potassium: 3.9 mmol/L (ref 3.5–5.1)
Sodium: 140 mmol/L (ref 135–145)

## 2023-04-27 LAB — ECHOCARDIOGRAM LIMITED
Height: 66 in
Weight: 3519.99 [oz_av]

## 2023-04-27 LAB — RESP PANEL BY RT-PCR (RSV, FLU A&B, COVID)  RVPGX2
Influenza A by PCR: NEGATIVE
Influenza B by PCR: NEGATIVE
Resp Syncytial Virus by PCR: NEGATIVE
SARS Coronavirus 2 by RT PCR: NEGATIVE

## 2023-04-27 LAB — TROPONIN I (HIGH SENSITIVITY)
Troponin I (High Sensitivity): 114 ng/L (ref <18)
Troponin I (High Sensitivity): 97 ng/L — ABNORMAL HIGH (ref <18)

## 2023-04-27 LAB — D-DIMER, QUANTITATIVE: D-Dimer, Quant: 0.54 ug{FEU}/mL — ABNORMAL HIGH (ref 0.00–0.50)

## 2023-04-27 NOTE — Telephone Encounter (Signed)
 Call back to Megan, advised that she is not listed on DPR forms as someone I can give Alexia's information to. Megan verbalizes understanding, verbalizes she will have her father call if they have any further questions. Advised that [facility] does have a rounding team at [facility] and she may have an easier time directing her questions to them. Megan verbalized understanding.

## 2023-04-27 NOTE — Progress Notes (Signed)
Echocardiogram 2D Echocardiogram has been performed.  Warren Lacy Evah Rashid RDCS 04/27/2023, 12:32 PM

## 2023-04-27 NOTE — ED Provider Notes (Signed)
 [place] EMERGENCY DEPARTMENT AT [facility] Provider Note   CSN: [id number] Arrival date & time: 04/27/23  0548     History  Chief Complaint  Patient presents with   Shortness of Breath    Jade [name] is a 62 y.o. female.   Shortness of Breath  This patient is a 62 year old female who was diagnosed with atrial fibrillation in the last 4 months or so, she did not tolerate beta-blockers because of hypotension and ultimately the decision was made to take her for a ablation.  She did well with the ablation and is now in sinus rhythm however she has had a couple of visits over the last week where she has had either chest pain or shortness of breath.  She was seen on 26 December about 5 days ago where she had an echocardiogram which was reassuring, she was started on colchicine  presuming that this was possibly a post ablation pericarditis, she was also started on Eliquis .  She has been taking his medications.  Last night she started to feel increasing heaviness on her chest and has been short of breath all night, she messaged her doctors that she was in a come to the ER for evaluation and they had agreed.  She denies swelling of her legs, denies fevers chills, she has had nausea but no diarrhea.    Home Medications Prior to Admission medications   Medication Sig Start Date End Date Taking? Authorizing Provider  albuterol (VENTOLIN HFA) 108 (90 Base) MCG/ACT inhaler Inhale into the lungs every 6 (six) hours as needed for wheezing or shortness of breath.    [provider]  apixaban  (ELIQUIS ) 5 MG TABS tablet Take 1 tablet (5 mg total) by mouth 2 (two) times daily. 03/23/23   Kennyth Chew, MD  colchicine  0.6 MG tablet Take 1 tablet (0.6 mg total) by mouth daily. 04/23/23   Lesia Ozell Barter, PA-C  sucralfate  (CARAFATE ) 1 g tablet Take 1 tablet (1 g total) by mouth 4 (four) times daily -  with meals and at bedtime. Patient not taking: Reported on 04/22/2023  04/09/23   Catherine Fuller A, DO  valACYclovir  (VALTREX ) 500 MG tablet Take 1 tablet (500 mg total) by mouth daily. Patient taking differently: Take 500 mg by mouth daily as needed (outbreaks). 10/09/22   Kuneff, Renee A, DO      Allergies    Ciprofloxacin and Sulfa antibiotics    Review of Systems   Review of Systems  Respiratory:  Positive for shortness of breath.   All other systems reviewed and are negative.   Physical Exam Updated Vital Signs BP 123/88 (BP Location: Right Arm)   Pulse 91   Temp 98.4 F (36.9 C)   Resp 18   Ht 1.676 m (5' 6)   Wt 99.8 kg   SpO2 96%   BMI 35.51 kg/m  Physical Exam Vitals and nursing note reviewed.  Constitutional:      General: She is not in acute distress.    Appearance: She is well-developed.  HENT:     Head: Normocephalic and atraumatic.     Mouth/Throat:     Pharynx: No oropharyngeal exudate.  Eyes:     General: No scleral icterus.       Right eye: No discharge.        Left eye: No discharge.     Conjunctiva/sclera: Conjunctivae normal.     Pupils: Pupils are equal, round, and reactive to light.  Neck:  Thyroid : No thyromegaly.     Vascular: No JVD.  Cardiovascular:     Rate and Rhythm: Regular rhythm. Tachycardia present.     Heart sounds: Normal heart sounds. No murmur heard.    No friction rub. No gallop.     Comments: Heart rate of about 105 bpm, sinus tachycardia Pulmonary:     Effort: Pulmonary effort is normal. No respiratory distress.     Breath sounds: Normal breath sounds. No wheezing or rales.  Abdominal:     General: Bowel sounds are normal. There is no distension.     Palpations: Abdomen is soft. There is no mass.     Tenderness: There is no abdominal tenderness.  Musculoskeletal:        General: No tenderness. Normal range of motion.     Cervical back: Normal range of motion and neck supple.  Lymphadenopathy:     Cervical: No cervical adenopathy.  Skin:    General: Skin is warm and dry.      Findings: No erythema or rash.  Neurological:     Mental Status: She is alert.     Coordination: Coordination normal.  Psychiatric:        Behavior: Behavior normal.     ED Results / Procedures / Treatments   Labs (all labs ordered are listed, but only abnormal results are displayed) Labs Reviewed  BASIC METABOLIC PANEL - Abnormal; Notable for the following components:      Result Value   Glucose, Bld 118 (*)    All other components within normal limits  D-DIMER, QUANTITATIVE - Abnormal; Notable for the following components:   D-Dimer, Quant 0.54 (*)    All other components within normal limits  TROPONIN I (HIGH SENSITIVITY) - Abnormal; Notable for the following components:   Troponin I (High Sensitivity) 114 (*)    All other components within normal limits  TROPONIN I (HIGH SENSITIVITY) - Abnormal; Notable for the following components:   Troponin I (High Sensitivity) 97 (*)    All other components within normal limits  RESP PANEL BY RT-PCR (RSV, FLU A&B, COVID)  [id number]  CBC    EKG None  Radiology DG Chest Portable 1 View Result Date: 04/27/2023 CLINICAL DATA:  sob EXAM: PORTABLE CHEST - 1 VIEW COMPARISON:  04/22/2023. FINDINGS: Cardiac silhouette is unremarkable. No pneumothorax or pleural effusion. Alveolar opacity in the left base is consistent with small focus of consolidation or atelectasis. Lungs are otherwise clear. Normal pulmonary vasculature. The visualized skeletal structures are unremarkable. IMPRESSION: Left base opacity consistent with atelectasis or minimal consolidation. Electronically Signed   By: Fonda Field M.D.   On: 04/27/2023 07:50    Procedures Procedures    Medications Ordered in ED Medications - No data to display  ED Course/ Medical Decision Making/ A&P Clinical Course as of 04/28/23 0703  Tue Apr 27, 2023  1206 Discussed with cardiology at 12:00 PM, they will come see the patient in consultation and agree with echocardiogram [BM]     Clinical Course User Index [BM] Cleotilde Rogue, MD                                 Medical Decision Making Amount and/or Complexity of Data Reviewed Labs: ordered. Radiology: ordered.   The patient's chest x-ray is reassuring, there is some atelectasis or possibly mild consolidation at the bottom of the left lung but the patient is not coughing or having any  fevers or chills.  She has an EKG which shows mild sinus tachycardia, but no other acute findings.  The patient is symptomatic, cardiology will be consulted to give their opinion on what else could be done, echocardiogram will be ordered, her troponin is continuing to decrease and is currently at 97 where it had been over 5000 - 5 days ago.  Her D-dimer is 0.54 and age-adjusted this is unremarkable and not significant or consistent with a pulmonary embolism especially since she is anticoagulated with 5 days of Eliquis .  Labs: I personally viewed and interpreted labs and I agree that this patient does not have a leukocytosis or significant electrolyte abnormalities, troponin is downtrending and she is COVID-negative  Imaging: I personally viewed and interpret the x-ray which shows a possible left lower atelectasis or mild infiltrate, I agree with radiology interpretation.  Cardiac monitoring: Mild sinus tachycardia or now normal sinus rhythm  Consultations: Cardiology consulted, they recommend:  ED course: At the time of change of shift the formal recommendations from cardiology have not yet been published, echocardiac results pending, care signed over to oncoming physician Dr. Mliss Boyers to follow-up recommendations and disposition accordingly        Final Clinical Impression(s) / ED Diagnoses Final diagnoses:  None    Rx / DC Orders ED Discharge Orders     None         Cleotilde Rogue, MD 04/28/23 [phone]

## 2023-04-27 NOTE — ED Provider Notes (Signed)
 Pt signed out by Dr. Cleotilde pending cardiology eval.  The cardiologists have seen her and recommend d/c.  She is to take either colchicine  or ibuprofen , not both.  She is stable for d/c.  Return if worse. F/u with pcp/cards.   Dean Clarity, MD 04/27/23 [phone]

## 2023-04-27 NOTE — ED Triage Notes (Signed)
 Pt arrived POV d/t waking up at 0330 with Nausea, diaphoresis, SOB and chest pressure.  Pt had an Ablation 04/20/23 for A-Fib and has had this periodically since then.  Her cardiologist advised her to come in for CT if worse.

## 2023-04-27 NOTE — Consult Note (Addendum)
 Cardiology Consultation   Patient ID: Jade [name] MRN: [id number]; DOB: Sep 23, 1960  Admit date: 04/27/2023 Date of Consult: 04/27/2023  PCP:  Jade [name] LABOR, DO   [facility] Providers Cardiologist:  Jade ONEIDA Decent, MD  Electrophysiologist:  Jade Kitty, MD       Patient Profile:   Jade [name] is a 62 y.o. female with a hx of anxiety, AFib who is being seen 04/27/2023 for the evaluation of recurrent/ongoing CP post ablation at the request of Dr. Cleotilde.  History of Present Illness:   Jade [name] initially saw cardiology with c/o daily episodes of palpitations, shortness of breath, and chest discomfort. Coronary CTA was done which was normal. Zio monitor was ordered which revealed a 4% AF burden.   Referred to EP  Saw Dr. 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 Felt a good ablation candidate of intolerant or wanted to stop meds, or failed meds.  Ultimately scheduled fro ablation 04/20/23  F/u with EP APP 04/02/23, reported insomnia with metoprolol , generally intolerant to meds historically, and never started the flecainide  Doing OK with plans to proceed with her scheduled ablation  Saw Dr. Burton 04/15/23, doing OK< insomnia that she felt was anxiety and medication related, though arrhythmia burden improved  PVI ablation 04/20/23 (PFA) CONCLUSIONS: 1. Successful PVI 2. Successful ablation/isolation of the posterior wall 3. Intracardiac echo reveals normal LV size and function, trivial pericardial effusion, normal PV anatomy. 4. No early apparent complications. 5. Resume Eliquis  in 2 hours.   Came to the ER 04/22/23 c/o CP/SOB, admitted for obs, TTE noted LVEF 55-60%, no WMA< no pericardial effusion Symptom better after toradol  Elevated Trops as expected post ablation Normal coronary arteries by CT known for  her Discharged 04/23/23 On colchicine  0.6mg  daily for a month Her Toprol  stopped  Pt called office service 04/25/23, pt reported initially symptoms much improved initially though this day reported Asked her to start ibuprofen  800 mg tid in addition to colchicine  0.6 mg bid discussed if not improved, planned to recheck echo. He and pt also acknowledged there may be some contribution of anxiety that could be a driver of her sx  F/u later the same day with APP at that time feeling better  Comes today 04/27/23 to the ER reporting symptoms starting last evening of increasing heaviness in her chest/as escalating SOB  LABS K+ 3.9 BUN/Creat 16/0.99 HS Trop 114 (on 12/26 was 5316 > 3903 POST ablation) WBC 8/2 H/H 12/37 Plts 263  Ddimer 0.54 (0.00 - 0.50)  TODAY She reports that  Initially with the colchicine  she did feel better though symptoms seemed to creep back in  Heaviness on her chest that at its worse felt opressive like she could not breath Sitting up seemed to perhaps help but not resolve When she added the ibuprofen  again had transient improvement though again started with heaviness in her chest > SOB Would sit up and felt very nauseous, clammy, and very weak in her lags THIS time though seemed connected to times after eating > resting/laying down  She has never had this symptom before Some weeks prior to her ablation she did have some RUQ pain that was apparently evaluated by her PMD and reported that US  showed her GB normal, with a renal cyst some fat around her liver only  She is NOT having pain with swallowing/difficulty eating She has had  days in between that she feels quite good as well  She is feeling better here though slightly hesitant to try eating since that of late has been a trigger    Past Medical History:  Diagnosis Date   Anxiety    Asthma    uses inhaler prn   Chest pain    Colon polyps    Diarrhea    with constipation while taking diet pills (Alli)    History of rectal bleeding     2 times in past   HSV-2 (herpes simplex virus 2) infection    Palpitations    in past/due to Advil  pm   Vitamin D  deficiency     Past Surgical History:  Procedure Laterality Date   ABDOMINAL HYSTERECTOMY  1998   fibroids. partial   ATRIAL FIBRILLATION ABLATION N/A 04/20/2023   Procedure: ATRIAL FIBRILLATION ABLATION;  Surgeon: Jade Chew, MD;  Location: [facility] INVASIVE CV LAB;  Service: Cardiovascular;  Laterality: N/A;   CESAREAN SECTION     1 time   COLONOSCOPY W/ POLYPECTOMY  2017   RESECTOSCOPIC MYOMECTOMY       Home Medications:  Prior to Admission medications   Medication Sig Start Date End Date Taking? Authorizing Provider  albuterol (VENTOLIN HFA) 108 (90 Base) MCG/ACT inhaler Inhale into the lungs every 6 (six) hours as needed for wheezing or shortness of breath.   Yes [provider]  apixaban  (ELIQUIS ) 5 MG TABS tablet Take 1 tablet (5 mg total) by mouth 2 (two) times daily. 03/23/23  Yes Jade Chew, MD  colchicine  0.6 MG tablet Take 1 tablet (0.6 mg total) by mouth daily. 04/23/23  Yes Jade Ozell Barter, PA-C  ibuprofen  (ADVIL ) 200 MG tablet Take 800 mg by mouth 3 (three) times daily.   Yes [provider]  valACYclovir  (VALTREX ) 500 MG tablet Take 1 tablet (500 mg total) by mouth daily. Patient taking differently: Take 500 mg by mouth daily as needed (outbreaks). 10/09/22  Yes Jade [name], Jade Kleiman A, DO  sucralfate  (CARAFATE ) 1 g tablet Take 1 tablet (1 g total) by mouth 4 (four) times daily -  with meals and at bedtime. Patient not taking: Reported on 04/22/2023 04/09/23   Jade [name] LABOR, DO    Inpatient Medications: Scheduled Meds:  Continuous Infusions:  PRN Meds:   Allergies:    Allergies  Allergen Reactions   Ciprofloxacin Anaphylaxis   Sulfa Antibiotics     hives    Social History:   Social History   Socioeconomic History   Marital status: Married    Spouse name: Not on file   Number of  children: 1   Years of education: Not on file   Highest education level: Not on file  Occupational History   Occupation: Armed forces operational officer  Tobacco Use   Smoking status: Never   Smokeless tobacco: Never  Vaping Use   Vaping status: Never Used  Substance and Sexual Activity   Alcohol use: Not Currently    Alcohol/week: 1.0 - 2.0 standard drink of alcohol    Types: 1 - 2 Glasses of wine per week    Comment: occ   Drug use: No   Sexual activity: Yes    Partners: Male    Birth control/protection: Surgical    Comment: DECLINED INSURANCE QUESTIONS,DES NEG  Other Topics Concern   Not on file  Social History Narrative   Married. One child.   College-educated, works as a armed forces operational officer.   Smoke alarm in the home. Wears her seatbelt.  Feels safe in her relationship.   Social Drivers of Corporate Investment Banker Strain: Not on file  Food Insecurity: No Food Insecurity (04/22/2023)   Hunger Vital Sign    Worried About Running Out of Food in the Last Year: Never true    Ran Out of Food in the Last Year: Never true  Transportation Needs: No Transportation Needs (04/22/2023)   PRAPARE - Administrator, Civil Service (Medical): No    Lack of Transportation (Non-Medical): No  Physical Activity: Not on file  Stress: Not on file  Social Connections: Unknown (08/26/2021)   [facility]   Social Network    Social Network: Not on file  Intimate Partner Violence: Not At Risk (04/22/2023)   Humiliation, Afraid, Rape, and Kick questionnaire    Fear of Current or Ex-Partner: No    Emotionally Abused: No    Physically Abused: No    Sexually Abused: No    Family History:   Family History  Problem Relation Age of Onset   Asthma Mother    Diabetes Father    Hypertension Father    Heart disease Father    Prostate cancer Father    Atrial fibrillation Sister    Thyroid  disease Sister    Atrial fibrillation Brother    Diabetes Paternal Uncle     Lung cancer Paternal Grandfather    Colon cancer Neg Hx    Colon polyps Neg Hx    Rectal cancer Neg Hx    Stomach cancer Neg Hx    Esophageal cancer Neg Hx      ROS:  Please see the history of present illness.  All other ROS reviewed and negative.     Physical Exam/Data:   Vitals:   04/27/23 0556 04/27/23 0559 04/27/23 0905 04/27/23 1148  BP: 136/87  123/88 132/80  Pulse: (!) 114  91 (!) 101  Resp: 20  18 14   Temp: 99.2 F (37.3 C)  98.4 F (36.9 C)   TempSrc: Oral     SpO2: 100%  96% 100%  Weight:  99.8 kg    Height:  5' 6 (1.676 m)     No intake or output data in the 24 hours ending 04/27/23 1213    04/27/2023    5:59 AM 04/20/2023    5:47 AM 04/15/2023    1:48 PM  Last 3 Weights  Weight (lbs) 220 lb 220 lb 221 lb  Weight (kg) 99.791 kg 99.791 kg 100.245 kg     Body mass index is 35.51 kg/m.  General:  Well nourished, well developed, in no acute physical distress, but apears slight nervous/anxious HEENT: normal Neck: no JVD Vascular: No carotid bruits; Distal pulses 2+ bilaterally Cardiac:  RRR; no rubs, heart sounds are not distant, no murmurs No chest wall tenderness Lungs:  CTA b/l, no wheezing, rhonchi or rales  Abd: soft, nontender, not bloated Ext: no edema Musculoskeletal:  No deformities Skin: warm and dry  Neuro: no focal abnormalities noted Psych:  Normal affect, very pleasant   EKG:  The EKG was personally reviewed and demonstrates:    ST 105bpm, no scute/ischemic changes  OLD 04/22/23: SR 70pm, no ischemic changes 04/02/23: SR 69bpm  Telemetry:  Telemetry was personally reviewed and demonstrates:    SR, HRs settled into the 80's  Relevant CV Studies:  04/22/23: TTE 1. There is no pericardial effusion detected.   2. Left ventricular ejection fraction, by estimation, is 55 to 60%. The  left ventricle has normal function. The left ventricle has no regional  wall motion abnormalities. Left ventricular diastolic parameters are   indeterminate.   3. Right ventricular systolic function is normal. The right ventricular  size is normal.   4. Left atrial size was mildly dilated.   5. The mitral valve is normal in structure. Trivial mitral valve  regurgitation. No evidence of mitral stenosis.   6. The aortic valve was not well visualized. Aortic valve regurgitation  is not visualized. No aortic stenosis is present.   7. The inferior vena cava is normal in size with greater than 50%  respiratory variability, suggesting right atrial pressure of 3 mmHg.    ECHO 12/2022 > LVEF 60-65%, no RWMA Zio 01/26/23 >  4% AF burden, & rapid SVT Coronary CT with Score 01/29/23 > coronary calcium score 0, low risk for future cardiac events, no significant CAD ETT 02/18/23 > no ST deviation noted, exercise capacity mildly impaired, no ischemic changes noted at sub-target HR.  PT WAS NOT ON FLECAINIDE .    CCTA 01/29/2023 IMPRESSION: 1. Coronary artery calcium score 0 Agatston units. This suggests low risk for future cardiac events. 2.  No significant coronary artery disease noted.  Laboratory Data:  High Sensitivity Troponin:   Recent Labs  Lab 04/22/23 0842 04/22/23 1056 04/27/23 0603 04/27/23 0917  TROPONINIHS 5,316* 3,903* 114* 97*     Chemistry Recent Labs  Lab 04/22/23 0842 04/23/23 0810 04/27/23 0603  NA 139 140 140  K 3.8 3.5 3.9  CL 105 108 106  CO2 24 24 22   GLUCOSE 92 118* 118*  BUN 13 15 16   CREATININE 0.63 0.68 0.66  CALCIUM 9.0 8.9 9.3  GFRNONAA >60 >60 >60  ANIONGAP 10 8 12     No results for input(s): PROT, ALBUMIN, AST, ALT, ALKPHOS, BILITOT in the last 168 hours. Lipids No results for input(s): CHOL, TRIG, HDL, LABVLDL, LDLCALC, CHOLHDL in the last 168 hours.  Hematology Recent Labs  Lab 04/22/23 0842 04/23/23 0810 04/27/23 0603  WBC 10.1 6.7 8.2  RBC 4.24 3.93 4.29  HGB 12.2 11.4* 12.5  HCT 37.1 34.9* 37.7  MCV 87.5 88.8 87.9  MCH 28.8 29.0 29.1  MCHC 32.9 32.7  33.2  RDW 13.8 13.5 13.2  PLT 208 208 263   Thyroid  No results for input(s): TSH, FREET4 in the last 168 hours.  BNP Recent Labs  Lab 04/22/23 0842  BNP 49.3    DDimer  Recent Labs  Lab 04/27/23 0917  DDIMER 0.54*     Radiology/Studies:  DG Chest Portable 1 View Result Date: 04/27/2023 CLINICAL DATA:  sob EXAM: PORTABLE CHEST - 1 VIEW COMPARISON:  04/22/2023. FINDINGS: Cardiac silhouette is unremarkable. No pneumothorax or pleural effusion. Alveolar opacity in the left base is consistent with small focus of consolidation or atelectasis. Lungs are otherwise clear. Normal pulmonary vasculature. The visualized skeletal structures are unremarkable. IMPRESSION: Left base opacity consistent with atelectasis or minimal consolidation. Electronically Signed   By: Jade Field M.D.   On: 04/27/2023 07:50     Assessment and Plan:   CP/SOB Paroxysmal AFib CHA2DS2Vasc is 2 (age/gender) on Eliquis  S/p PVI ablation 04/20/23  As above Treated initially with Toradol  with improvement > started on colchicine  for home Ibuprofen  added some days later with recurrent symptoms Subsequent to this >>> Symptoms now seem post prandial Heavy chest, SOB >> as well as nausea followed by feeling weak, clammy, legs/feet feel weak like they will give out  She came last night  though by the time they got here symptoms abating and she went back home, though woke early this morning with recurrent symptoms and came in.  >>> wonder if colchicine  and or ibuprofen   maybe causing the post prandial addition of GI symptoms Limited echo today Trivial pericardial effusion, no tamponade  She had colchicine  this AM on an empty stomach did not bother her though at home has been with meals  Weakness/leg weakness/clamminess suspect vagal associated with the nausea   She does not think anxiety is the provoking problem, though the symptoms certainly are anxiety provoking and quite worrisome VSS She is  afebrile  Will be back with EP MD once he is out of procedure   ADDEND I have communicated with Dr. Kennyth on the phone, very unlikely for PE, Atrial/esophageal fistula has never been reported with PFA, low yield CT did not recommend we pursue. Dr. Nancey has seen the patient Constellation of symptoms and w/u to date with no worrisome findings Suspect colchicine  and or ibuprofen  adding a component of nausea trigging vagal response. Felt safe for discharge Advised the patient to avoid using both for her pericardial symptom She seemed to think the colchicine  may be bothering her stomach the most and would try it as a single agent I advised against using ibuprofen  with Eliquis  for more then a few days. I will make early follow up with her in out office or the Afib clinic [name] Arthur, PA-C   Risk Assessment/Risk Scores:    For questions or updates, please contact [facility] Please consult [email or web address] for contact info under    Signed, [name] Macario Arthur, PA-C  04/27/2023 12:13 PM

## 2023-04-27 NOTE — Telephone Encounter (Signed)
Pt's daughter called to explain that the pt is being denied a CT scan at [facility]. Please advise

## 2023-04-27 NOTE — ED Provider Triage Note (Signed)
 Emergency Medicine Provider Triage Evaluation Note  Jade [name] , a 62 y.o. female  was evaluated in triage.  Pt complains of intermittent feelings of shortness of breath for the past couple of days.  Also noted feeling hot and clamming this morning, but this is since resolved.  She recently had an ablation for A-fib, and was also recently diagnosed with pericarditis.  She has been taking colchicine  and ibuprofen  at home which did not help with her symptoms.  She was told to come to the ER for further evaluation if symptoms had not improved for a CT to rule out pulmonary embolism.  She is still taking her Eliquis .  Review of Systems  Positive: As above Negative: As above  Physical Exam  BP 136/87   Pulse (!) 114   Temp 99.2 F (37.3 C) (Oral)   Resp 20   Ht 5' 6 (1.676 m)   Wt 99.8 kg   SpO2 100%   BMI 35.51 kg/m  Gen:   Awake, no distress   Resp:  Normal effort  MSK:   Moves extremities without difficulty  Other:  Tachycardic  Medical Decision Making  Medically screening exam initiated at 6:52 AM.  Appropriate orders placed.  Jade [name] was informed that the remainder of the evaluation will be completed by another provider, this initial triage assessment does not replace that evaluation, and the importance of remaining in the ED until their evaluation is complete.     Jade Palma, PA-C 04/27/23 [phone]

## 2023-04-30 ENCOUNTER — Ambulatory Visit (HOSPITAL_COMMUNITY)
Admission: RE | Admit: 2023-04-30 | Discharge: 2023-04-30 | Disposition: A | Discharge status: Home / Self Care | Payer: BC Managed Care – PPO | Source: Ambulatory Visit | Attending: Physician Assistant | Admitting: Physician Assistant

## 2023-04-30 ENCOUNTER — Other Ambulatory Visit: Payer: Self-pay | Admitting: Family Medicine

## 2023-04-30 ENCOUNTER — Encounter (HOSPITAL_COMMUNITY): Payer: Self-pay | Admitting: Physician Assistant

## 2023-04-30 VITALS — BP 98/66 | HR 84 | Ht 66.0 in

## 2023-04-30 DIAGNOSIS — Z7901 Long term (current) use of anticoagulants: Secondary | ICD-10-CM | POA: Diagnosis not present

## 2023-04-30 DIAGNOSIS — I9789 Other postprocedural complications and disorders of the circulatory system, not elsewhere classified: Secondary | ICD-10-CM | POA: Diagnosis not present

## 2023-04-30 DIAGNOSIS — I480 Paroxysmal atrial fibrillation: Secondary | ICD-10-CM | POA: Insufficient documentation

## 2023-04-30 DIAGNOSIS — Z79899 Other long term (current) drug therapy: Secondary | ICD-10-CM | POA: Insufficient documentation

## 2023-04-30 DIAGNOSIS — I319 Disease of pericardium, unspecified: Secondary | ICD-10-CM | POA: Diagnosis not present

## 2023-04-30 NOTE — Progress Notes (Signed)
 Primary Care Physician: Catherine Charlies LABOR, DO Primary Cardiologist: Darryle ONEIDA Decent, MD Electrophysiologist: Fonda Kitty, MD  Referring Physician: Charlies Arthur    Jade [name] is a 63 y.o. female with a history of atrial fibrillation who presents for follow up in the [facility]. Recent history of frequent palpitations, SOB and chest discomfort. Cardiology evaluation included a coronary CTA which was normal and a Zio which showed 4% AF burden. She was referred to Dr. Kitty for evaluation on 02/05/23 and options for control were discussed. Patient elected to move forward with AF ablation. She was prescribed flecainide  and toprol  but never started flecainide . Patient is on Eliquis  for a CHADS2VASC score of 1. Patient is s/p afib ablation 04/20/23.  She was seen at the ED 04/22/23 with chest pain, worse when laying down. Started on colchicine . Echo showed normal EF with no pericardial effusion. She was seen again at the ED 04/27/23 with similar symptoms as well has GI upset with the colchicine . Started on ibuprofen .   On follow up today, patient reports that she is feeling much better. She has not had chest discomfort for the past two days and has not had to take ibuprofen . Her groin sites have healed. She remains in SR but has had some brief palpitations.   Today, she denies symptoms of chest pain, shortness of breath, orthopnea, PND, lower extremity edema, dizziness, presyncope, syncope, snoring, daytime somnolence, bleeding, or neurologic sequela. The patient is tolerating medications without difficulties and is otherwise without complaint today.    Atrial Fibrillation Risk Factors:  she does not have symptoms or diagnosis of sleep apnea. she does not have a history of rheumatic fever.   Atrial Fibrillation Management history:  Previous antiarrhythmic drugs: none Previous cardioversions: none Previous ablations: 04/20/23 Anticoagulation history:  Eliquis   ROS- All systems are reviewed and negative except as per the HPI above.  Past Medical History:  Diagnosis Date   Anxiety    Asthma    uses inhaler prn   Chest pain    Colon polyps    Diarrhea    with constipation while taking diet pills (Alli)   History of rectal bleeding     2 times in past   HSV-2 (herpes simplex virus 2) infection    Palpitations    in past/due to Advil  pm   Vitamin D  deficiency     Current Outpatient Medications  Medication Sig Dispense Refill   albuterol (VENTOLIN HFA) 108 (90 Base) MCG/ACT inhaler Inhale into the lungs every 6 (six) hours as needed for wheezing or shortness of breath.     apixaban  (ELIQUIS ) 5 MG TABS tablet Take 1 tablet (5 mg total) by mouth 2 (two) times daily. 180 tablet 1   ibuprofen  (ADVIL ) 200 MG tablet Take 800 mg by mouth 3 (three) times daily.     sucralfate  (CARAFATE ) 1 g tablet Take 1 tablet (1 g total) by mouth 4 (four) times daily -  with meals and at bedtime. 60 tablet 0   valACYclovir  (VALTREX ) 500 MG tablet Take 1 tablet (500 mg total) by mouth daily. (Patient taking differently: Take 500 mg by mouth daily as needed (outbreaks).) 90 tablet 3   No current facility-administered medications for this encounter.    Physical Exam: BP 98/66   Pulse 84   Ht 5' 6 (1.676 m)   BMI 35.51 kg/m   GEN: Well nourished, well developed in no acute distress NECK: No JVD; No carotid bruits CARDIAC: Regular rate  and rhythm, no murmurs, rubs, gallops RESPIRATORY:  Clear to auscultation without rales, wheezing or rhonchi  ABDOMEN: Soft, non-tender, non-distended EXTREMITIES:  No edema; No deformity   Wt Readings from Last 3 Encounters:  04/27/23 99.8 kg  04/20/23 99.8 kg  04/15/23 100.2 kg     EKG today demonstrates  SR Vent. rate 84 BPM PR interval 136 ms QRS duration 84 ms QT/QTcB 376/444 ms  Echo 04/27/23 demonstrated   1. Left ventricular ejection fraction, by estimation, is 55 to 60%. The  left ventricle has  normal function. The left ventricle has no regional  wall motion abnormalities.   2. Small iatrogenic ASD post ablation, flow all left to right.   3. There is no evidence of cardiac tamponade.   4. The inferior vena cava is normal in size with greater than 50%  respiratory variability, suggesting right atrial pressure of 3 mmHg.    CHA2DS2-VASc Score = 1  The patient's score is based upon: CHF History: 0 HTN History: 0 Diabetes History: 0 Stroke History: 0 Vascular Disease History: 0 Age Score: 0 Gender Score: 1       ASSESSMENT AND PLAN: Paroxysmal Atrial Fibrillation (ICD10:  I48.0) The patient's CHA2DS2-VASc score is 1, indicating a 0.6% annual risk of stroke.   S/p afib ablation 04/20/23 Patient appears to be maintaining SR. Continue Eliquis  5 mg BID with no missed doses for 3 months post ablation.  Pericarditis Post ablation Did not tolerate colchicine  due to GI side effects Feeling much better, can still use ibuprofen  PRN    Follow up in the AF clinic as scheduled.        Daril Kicks PA-C Afib Clinic [facility] [address] [place], [place] [address] [phone]

## 2023-05-04 ENCOUNTER — Ambulatory Visit (HOSPITAL_COMMUNITY): Payer: BC Managed Care – PPO | Admitting: Physician Assistant

## 2023-05-18 ENCOUNTER — Ambulatory Visit (HOSPITAL_COMMUNITY): Payer: BC Managed Care – PPO | Admitting: Physician Assistant

## 2023-05-21 ENCOUNTER — Ambulatory Visit (HOSPITAL_COMMUNITY)
Admission: RE | Admit: 2023-05-21 | Discharge: 2023-05-21 | Disposition: A | Discharge status: Home / Self Care | Payer: BC Managed Care – PPO | Source: Ambulatory Visit | Attending: Physician Assistant

## 2023-05-21 ENCOUNTER — Ambulatory Visit (HOSPITAL_COMMUNITY)
Admission: RE | Admit: 2023-05-21 | Discharge: 2023-05-21 | Disposition: A | Discharge status: Home / Self Care | Payer: BC Managed Care – PPO | Source: Ambulatory Visit | Attending: Physician Assistant | Admitting: Physician Assistant

## 2023-05-21 ENCOUNTER — Encounter (HOSPITAL_COMMUNITY): Payer: Self-pay | Admitting: Physician Assistant

## 2023-05-21 VITALS — BP 116/70 | HR 81 | Ht 66.0 in | Wt 224.2 lb

## 2023-05-21 DIAGNOSIS — Z79899 Other long term (current) drug therapy: Secondary | ICD-10-CM | POA: Insufficient documentation

## 2023-05-21 DIAGNOSIS — I480 Paroxysmal atrial fibrillation: Secondary | ICD-10-CM | POA: Diagnosis not present

## 2023-05-21 DIAGNOSIS — Z7901 Long term (current) use of anticoagulants: Secondary | ICD-10-CM | POA: Diagnosis not present

## 2023-05-21 DIAGNOSIS — I308 Other forms of acute pericarditis: Secondary | ICD-10-CM | POA: Diagnosis not present

## 2023-05-21 DIAGNOSIS — R0602 Shortness of breath: Secondary | ICD-10-CM | POA: Diagnosis not present

## 2023-05-21 LAB — BRAIN NATRIURETIC PEPTIDE: B Natriuretic Peptide: 48.3 pg/mL (ref 0.0–100.0)

## 2023-05-21 LAB — BASIC METABOLIC PANEL
Anion gap: 10 (ref 5–15)
BUN: 12 mg/dL (ref 6–20)
CO2: 23 mmol/L (ref 22–32)
Calcium: 9.1 mg/dL (ref 8.9–10.3)
Chloride: 107 mmol/L (ref 98–111)
Creatinine, Ser: 0.67 mg/dL (ref 0.44–1.00)
GFR, Estimated: >60 mL/min (ref >60)
Glucose, Bld: 109 mg/dL — ABNORMAL HIGH (ref 70–99)
Potassium: 3.7 mmol/L (ref 3.5–5.1)
Sodium: 140 mmol/L (ref 135–145)

## 2023-05-21 MED ORDER — FUROSEMIDE 20 MG PO TABS: 20.0000 mg | ORAL_TABLET | Freq: Every day | ORAL | 0 refills | Status: DC | PRN

## 2023-05-21 MED ORDER — COLCHICINE 0.6 MG PO TABS: 0.6000 mg | ORAL_TABLET | Freq: Every day | ORAL | Status: DC

## 2023-05-21 NOTE — Progress Notes (Signed)
Primary Care Physician: Natalia Leatherwood, DO Primary Cardiologist: Reatha Harps, MD Electrophysiologist: Nobie Putnam, MD  Referring Physician: Francis Dowse    Jade [name] is a 63 y.o. female with a history of atrial fibrillation who presents for follow up in the [facility]. Recent history of frequent palpitations, SOB and chest discomfort. Cardiology evaluation included a coronary CTA which was normal and a Zio which showed 4% AF burden. She was referred to Dr. Jimmey Ralph for evaluation on 02/05/23 and options for control were discussed. Patient elected to move forward with AF ablation. She was prescribed flecainide and toprol but never started flecainide. Patient is on Eliquis for a CHADS2VASC score of 1. Patient is s/p afib ablation 04/20/23.  She was seen at the ED 04/22/23 with chest pain, worse when laying down. Started on colchicine. Echo showed normal EF with no pericardial effusion. She was seen again at the ED 04/27/23 with similar symptoms as well has GI upset with the colchicine. Started on ibuprofen.   Patient returns for follow up for atrial fibrillation. She reports that she had done "great" since her last visit until 1/20 when she started having chest tightness and SOB again. Very similar to her presentation to her ED visits on 12/26 and 12/31. She took a dose of colchicine which did improve her symptoms. However, she began having symptoms again last night and took another colchicine with only partial relief of symptoms. Her SOB is worse when laying down.   Today, he denies symptoms of palpitations, PND, lower extremity edema, dizziness, presyncope, syncope, snoring, daytime somnolence, bleeding, or neurologic sequela. The patient is tolerating medications without difficulties and is otherwise without complaint today.    Atrial Fibrillation Risk Factors:  she does not have symptoms or diagnosis of sleep apnea. she does not have a history of  rheumatic fever.   Atrial Fibrillation Management history:  Previous antiarrhythmic drugs: none Previous cardioversions: none Previous ablations: 04/20/23 Anticoagulation history: Eliquis  ROS- All systems are reviewed and negative except as per the HPI above.  Past Medical History:  Diagnosis Date   Anxiety    Asthma    uses inhaler prn   Chest pain    Colon polyps    Diarrhea    with constipation while taking diet pills (Alli)   History of rectal bleeding     2 times in past   HSV-2 (herpes simplex virus 2) infection    Palpitations    in past/due to Advil pm   Vitamin D deficiency     Current Outpatient Medications  Medication Sig Dispense Refill   albuterol (VENTOLIN HFA) 108 (90 Base) MCG/ACT inhaler Inhale into the lungs every 6 (six) hours as needed for wheezing or shortness of breath.     apixaban (ELIQUIS) 5 MG TABS tablet Take 1 tablet (5 mg total) by mouth 2 (two) times daily. 180 tablet 1   ibuprofen (ADVIL) 200 MG tablet Take 800 mg by mouth 3 (three) times daily.     sucralfate (CARAFATE) 1 g tablet TAKE 1 TABLET (1 G TOTAL) BY MOUTH 4 TIMES A DAY WITH MEALS AND AT BEDTIME 60 tablet 0   valACYclovir (VALTREX) 500 MG tablet Take 1 tablet (500 mg total) by mouth daily. (Patient taking differently: Take 500 mg by mouth daily as needed (outbreaks).) 90 tablet 3   No current facility-administered medications for this encounter.    Physical Exam: BP 116/70   Pulse 81   Ht 5\' 6"  (  1.676 m)   Wt 101.7 kg   BMI 36.19 kg/m   GEN: Well nourished, well developed in no acute distress NECK: No JVD; No carotid bruits CARDIAC: Regular rate and rhythm, no murmurs, rubs, gallops RESPIRATORY:  Clear to auscultation without rales, wheezing or rhonchi  ABDOMEN: Soft, non-tender, non-distended EXTREMITIES:  No edema; No deformity    Wt Readings from Last 3 Encounters:  05/21/23 101.7 kg  04/27/23 99.8 kg  04/20/23 99.8 kg     EKG today demonstrates  SR Vent.  rate 81 BPM PR interval 138 ms QRS duration 82 ms QT/QTcB 384/446 ms   Echo 04/27/23 demonstrated   1. Left ventricular ejection fraction, by estimation, is 55 to 60%. The  left ventricle has normal function. The left ventricle has no regional  wall motion abnormalities.   2. Small iatrogenic ASD post ablation, flow all left to right.   3. There is no evidence of cardiac tamponade.   4. The inferior vena cava is normal in size with greater than 50%  respiratory variability, suggesting right atrial pressure of 3 mmHg.    CHA2DS2-VASc Score = 1  The patient's score is based upon: CHF History: 0 HTN History: 0 Diabetes History: 0 Stroke History: 0 Vascular Disease History: 0 Age Score: 0 Gender Score: 1       ASSESSMENT AND PLAN: Paroxysmal Atrial Fibrillation (ICD10:  I48.0) The patient's CHA2DS2-VASc score is 1, indicating a 0.6% annual risk of stroke.   S/p afib ablation 04/20/23 Patient appears to be maintaining SR Continue Eliquis 5 mg BID with no missed doses for 3 months post ablation  Pericarditis Post ablation No acute changes on ECG, no rub on exam.  Will have her take colchicine 0.6 mg once daily for one week. This dose seems to help her symptoms without causing her GI upset.  Her weight is also up 4-5 lbs since her last visit. Will check bmet/BNP and CXR. Start Lasix 20 mg x 3 days.  Reassured patient     Follow up with Francis Dowse as scheduled.        Jorja Loa PA-C [facility] [address] [place], [place] [address] [phone]

## 2023-05-21 NOTE — Patient Instructions (Addendum)
Lasix 20mg  daily for the next 3 days then only as needed for weight gain/shortness of breath   Colchicine once a day for the next week then stop.

## 2023-05-26 ENCOUNTER — Ambulatory Visit: Payer: Self-pay | Admitting: Family Medicine

## 2023-05-26 NOTE — Telephone Encounter (Signed)
No further action needed.

## 2023-05-26 NOTE — Telephone Encounter (Signed)
  Chief Complaint: Abdominal pain Symptoms: right upper abdominal pain that comes and goes but has increased in intensity Frequency: comes and goes Pertinent Negatives: Patient denies fever, nausea, vomiting Disposition: [] ED /[] Urgent Care (no appt availability in office) / [x] Appointment(In office/virtual)/ []  [facility]/ [] Home Care/ [] Refused Recommended Disposition /[] [facility]/ []  Follow-up with PCP Additional Notes: patient with history of abdominal pain that has been going on since November. Patient states pain has increased in intensity but is still coming and going. Denies nausea and vomiting. Patient is requesting an appointment. Per protocol, appointment would be the recommendation. Appointment made for 05/28/2023 at 2:40 pm with PCP. Patient verbalized understanding of plan and all questions answered.    Copied from CRM [id number]. Topic: Clinical - Red Word Triage >> May 26, 2023  9:54 AM Deaijah H wrote: Red Word that prompted transfer to Nurse Triage: Pain upper right abdomen would like the referral for the ongoing pain turning severe. Reason for Disposition  Abdominal pain is a chronic symptom (recurrent or ongoing AND present > 4 weeks)  Answer Assessment - Initial Assessment Questions 1. LOCATION: "Where does it hurt?"      Right abdomen 2. RADIATION: "Does the pain shoot anywhere else?" (e.g., chest, back)     Localized pain 3. ONSET: "When did the pain begin?" (e.g., minutes, hours or days ago)      Started in November 2024 4. SUDDEN: "Gradual or sudden onset?"     Sudden onset 5. PATTERN "Does the pain come and go, or is it constant?"    - If it comes and goes: "How long does it last?" "Do you have pain now?"     (Note: Comes and goes means the pain is intermittent. It goes away completely between bouts.)    - If constant: "Is it getting better, staying the same, or getting worse?"      (Note: Constant means the pain never goes away completely;  most serious pain is constant and gets worse.)      intermittent 6. SEVERITY: "How bad is the pain?"  (e.g., Scale 1-10; mild, moderate, or severe)    - MILD (1-3): Doesn't interfere with normal activities, abdomen soft and not tender to touch.     - MODERATE (4-7): Interferes with normal activities or awakens from sleep, abdomen tender to touch.     - SEVERE (8-10): Excruciating pain, doubled over, unable to do any normal activities.       No pain currently 7. RECURRENT SYMPTOM: "Have you ever had this type of stomach pain before?" If Yes, ask: "When was the last time?" and "What happened that time?"      Had it since November 8. CAUSE: "What do you think is causing the stomach pain?"     unsure 9. RELIEVING/AGGRAVATING FACTORS: "What makes it better or worse?" (e.g., antacids, bending or twisting motion, bowel movement)     Changing position 10. OTHER SYMPTOMS: "Do you have any other symptoms?" (e.g., back pain, diarrhea, fever, urination pain, vomiting)       No other symptoms  Protocols used: Abdominal Pain - Female-A-AH

## 2023-05-27 ENCOUNTER — Other Ambulatory Visit: Payer: Self-pay | Admitting: Cardiology

## 2023-05-27 ENCOUNTER — Telehealth: Payer: Self-pay | Admitting: Cardiology

## 2023-05-27 ENCOUNTER — Ambulatory Visit: Payer: BC Managed Care – PPO | Admitting: Cardiovascular Disease

## 2023-05-27 ENCOUNTER — Ambulatory Visit: Payer: BC Managed Care – PPO | Attending: Cardiology | Admitting: Cardiology

## 2023-05-27 VITALS — BP 122/78 | HR 89 | Ht 66.0 in | Wt 222.0 lb

## 2023-05-27 DIAGNOSIS — R079 Chest pain, unspecified: Secondary | ICD-10-CM

## 2023-05-27 DIAGNOSIS — R072 Precordial pain: Secondary | ICD-10-CM

## 2023-05-27 DIAGNOSIS — D6869 Other thrombophilia: Secondary | ICD-10-CM

## 2023-05-27 DIAGNOSIS — I480 Paroxysmal atrial fibrillation: Secondary | ICD-10-CM | POA: Diagnosis not present

## 2023-05-27 DIAGNOSIS — I319 Disease of pericardium, unspecified: Secondary | ICD-10-CM | POA: Diagnosis not present

## 2023-05-27 DIAGNOSIS — R0602 Shortness of breath: Secondary | ICD-10-CM | POA: Diagnosis not present

## 2023-05-27 NOTE — Telephone Encounter (Signed)
Pt called in asking for a doctor's note from today's appt be sent to her via mychart.

## 2023-05-27 NOTE — Telephone Encounter (Signed)
Work note sent to pt via My Chart. Called pt advised of this information no further concerns at this time.

## 2023-05-27 NOTE — Progress Notes (Signed)
Electrophysiology Office Note:   Date:  05/27/2023  ID:  Jade [name], DOB 1961/02/18, MRN [id number]  Primary Cardiologist: Reatha Harps, MD Electrophysiologist: Nobie Putnam, MD      History of Present Illness:   Jade [name] is a 63 y.o. female with h/o anxiety and symptomatic paroxysmal atrial fibrillation s/p catheter ablation on 12/24 who is being seen today for follow up.   Discussed the use of AI scribe software for clinical note transcription with the patient, who gave verbal consent to proceed.  History of Present Illness   Since her catheter ablation the patient has had intermittent chest tightness and shortness of breath. They describe the sensation as feeling like something heavy is squeezing them, making it extremely difficult to breathe. The symptoms interfere with daily activities. The pain can be triggered by eating or even minor physical exertion, such as carrying a bag of groceries or holding a steering wheel. The patient also reports difficulty sleeping due to the inability to lie flat without experiencing discomfort. She can do well for a week at a time then symptoms return. The patient has been taking colchicine and ibuprofen, which seem to provide temporary relief, but the symptoms return once the medication is stopped.      Review of systems complete and found to be negative unless listed in HPI.   EP Information / Studies Reviewed:    EKG is not ordered today. EKG from 05/21/23 reviewed which showed normal sinus rhythm.      Echo 04/27/23:  1. Left ventricular ejection fraction, by estimation, is 55 to 60%. The  left ventricle has normal function. The left ventricle has no regional  wall motion abnormalities.   2. Small iatrogenic ASD post ablation, flow all left to right.   3. There is no evidence of cardiac tamponade.   4. The inferior vena cava is normal in size with greater than 50%  respiratory variability, suggesting right atrial pressure of 3  mmHg.   05/21/23 CXR:  IMPRESSION: No active cardiopulmonary disease.  Risk Assessment/Calculations:    CHA2DS2-VASc Score = 1   This indicates a 0.6% annual risk of stroke. The patient's score is based upon: CHF History: 0 HTN History: 0 Diabetes History: 0 Stroke History: 0 Vascular Disease History: 0 Age Score: 0 Gender Score: 1              Physical Exam:   VS:  BP 122/78   Pulse 89   Ht 5\' 6"  (1.676 m)   Wt 222 lb (100.7 kg)   SpO2 97%   BMI 35.83 kg/m    Wt Readings from Last 3 Encounters:  05/27/23 222 lb (100.7 kg)  05/21/23 224 lb 3.2 oz (101.7 kg)  04/27/23 220 lb (99.8 kg)     GEN: Well nourished, well developed in no acute distress NECK: No JVD CARDIAC: Normal rate and regular rhythm, no pericardial friction rub RESPIRATORY:  Clear to auscultation without rales, wheezing or rhonchi  ABDOMEN: Soft,  non-distended EXTREMITIES:  No edema; No deformity   ASSESSMENT AND PLAN:   Jade [name] is a 63 y.o. female with h/o anxiety and symptomatic paroxysmal atrial fibrillation s/p catheter ablation on 12/24 who is being seen today for follow up.   #Chest tightness and shortness of breath: Patient has had intermittent symptoms since ablation. She was empirically started on treatment for post-ablation pericarditis based on symptoms. She has some relief with meds then symptoms return when stopped, which would support this diagnosis. ECGs, however,  have never demonstrated pericarditis and inflammatory markers have been normal. She has had 2 limited echocardiograms since ablation which were unremarkable. There was a trivial pericardial effusion. Her BNP has been normal. Troponin was elevated post ablation, which is to be expected, and has downtrended on subsequent checks. She had normal coronary CTA in October.  - CT scan of chest.  - Repeat CBC, CRP.  - If CRP elevated then empirically add steroids for pericarditis and order cardiac MRI to look for pericardial  inflammation.  - Continue colchicine and ibuprofen for now.  Will cover with GI prophylaxis - pantoprazole and sucralfate.   #Paroxysmal atrial fibrillation s/p ablation: No known recurrence. Has had heart rates in low 100s during pain. Has been sinus on ECGs. - CHADSVASC score of 1, female gender only.  - Continue Eliquis for 3 months post-ablation then stop.   Signed, Nobie Putnam, MD

## 2023-05-27 NOTE — Patient Instructions (Signed)
Medication Instructions:  Your physician recommends that you continue on your current medications as directed. Please refer to the Current Medication list given to you today.  *If you need a refill on your cardiac medications before your next appointment, please call your pharmacy*   Lab Work: TODAY: CBC, ESR, CRP, Troponin If you have labs (blood work) drawn today and your tests are completely normal, you will receive your results only by: MyChart Message (if you have MyChart) OR A paper copy in the mail If you have any lab test that is abnormal or we need to change your treatment, we will call you to review the results.   Testing/Procedures: Cardiac CT Your physician has requested that you have cardiac CT. Cardiac computed tomography (CT) is a painless test that uses an x-ray machine to take clear, detailed pictures of your heart. For further information please visit [email or web address] Please follow instruction sheet as given.   Follow-Up: At [facility], you and your health needs are our priority.  As part of our continuing mission to provide you with exceptional heart care, we have created designated Provider Care Teams.  These Care Teams include your primary Cardiologist (physician) and Advanced Practice Providers (APPs -  Physician Assistants and Nurse Practitioners) who all work together to provide you with the care you need, when you need it.   Your next appointment:   As scheduled

## 2023-05-27 NOTE — Telephone Encounter (Signed)
Pt c/o Shortness Of Breath: STAT if SOB developed within the last 24 hours or pt is noticeably SOB on the phone  1. Are you currently SOB (can you hear that pt is SOB on the phone)? no  2. How long have you been experiencing SOB? Started 2 days after afib ablation  3. Are you SOB when sitting or when up moving around? Moving around  4. Are you currently experiencing any other symptoms? Chest heaviness. BP is high 120's/70's and HR is in the 80's. Patient does not believe this is afib.    Pt c/o of Chest Pain: STAT if active CP, including tightness, pressure, jaw pain, radiating pain to shoulder/upper arm/back, CP unrelieved by Nitro. Symptoms reported of SOB, nausea, vomiting, sweating.  1. Are you having CP right now? No but she is riding in the car and as long as she is not walking/moving she does not have it. States that it is not a pain but rather heaviness.    2. Are you experiencing any other symptoms (ex. SOB, nausea, vomiting, sweating)? SOB and lightheaded   3. Is your CP continuous or coming and going? Coming and going   4. Have you taken Nitroglycerin? no   5. How long have you been experiencing CP? Coming and going for    6. If NO CP at time of call then end call with telling Pt to call back or call 911 if Chest pain returns prior to return call from triage team.     Patient states that SOB started 2 days after ablation, she went to the ED and was diagnosed with pericarditis and put on colchicine. She ended up going back tot he ED because she could not breathe which she believes was coming from the medication and was taken off. She follow up with Jorja Loa, PA who put her back on the colchicine and yesterday while at the grocery store she became SOB again. She states that she has mucus along with her chest heaviness which her PCP has told her is a respiratory infection but she has no congestion/coughing. Patient does not want to go to ED, she states that it is getting  too expensive and was adamant on coming in today ash she was already on her way to the office. She is scheduled to see Dr. Jimmey Ralph today at 1:45pm

## 2023-05-27 NOTE — Telephone Encounter (Signed)
Pt was scheduled to see MD today at 1:45 pm.  Concern addressed at OV.

## 2023-05-28 ENCOUNTER — Ambulatory Visit (HOSPITAL_COMMUNITY)
Admission: RE | Admit: 2023-05-28 | Discharge: 2023-05-28 | Disposition: A | Discharge status: Home / Self Care | Payer: BC Managed Care – PPO | Source: Ambulatory Visit | Attending: Cardiology | Admitting: Cardiology

## 2023-05-28 ENCOUNTER — Encounter: Payer: Self-pay | Admitting: Cardiology

## 2023-05-28 ENCOUNTER — Telehealth: Payer: Self-pay

## 2023-05-28 ENCOUNTER — Other Ambulatory Visit: Payer: Self-pay | Admitting: Family Medicine

## 2023-05-28 ENCOUNTER — Ambulatory Visit: Payer: BC Managed Care – PPO | Admitting: Family Medicine

## 2023-05-28 DIAGNOSIS — Z1231 Encounter for screening mammogram for malignant neoplasm of breast: Secondary | ICD-10-CM

## 2023-05-28 DIAGNOSIS — R0989 Other specified symptoms and signs involving the circulatory and respiratory systems: Secondary | ICD-10-CM | POA: Diagnosis not present

## 2023-05-28 DIAGNOSIS — R590 Localized enlarged lymph nodes: Secondary | ICD-10-CM | POA: Diagnosis not present

## 2023-05-28 DIAGNOSIS — R918 Other nonspecific abnormal finding of lung field: Secondary | ICD-10-CM | POA: Diagnosis not present

## 2023-05-28 DIAGNOSIS — R079 Chest pain, unspecified: Secondary | ICD-10-CM | POA: Diagnosis not present

## 2023-05-28 DIAGNOSIS — R072 Precordial pain: Secondary | ICD-10-CM

## 2023-05-28 LAB — SEDIMENTATION RATE: Sed Rate: 20 mm/h (ref 0–40)

## 2023-05-28 LAB — CBC
Hematocrit: 39.2 % (ref 34.0–46.6)
Hemoglobin: 13 g/dL (ref 11.1–15.9)
MCH: 29.1 pg (ref 26.6–33.0)
MCHC: 33.2 g/dL (ref 31.5–35.7)
MCV: 88 fL (ref 79–97)
Platelets: 247 10*3/uL (ref 150–450)
RBC: 4.46 x10E6/uL (ref 3.77–5.28)
RDW: 13 % (ref 11.7–15.4)
WBC: 6.8 10*3/uL (ref 3.4–10.8)

## 2023-05-28 LAB — C-REACTIVE PROTEIN: CRP: 2 mg/L (ref 0–10)

## 2023-05-28 LAB — TROPONIN T: Troponin T (Highly Sensitive): <6 ng/L (ref 0–14)

## 2023-05-28 MED ORDER — IOHEXOL 350 MG/ML SOLN
75.0000 mL | Freq: Once | INTRAVENOUS | Status: AC | PRN
  Administered 2023-05-28: 75 mL via INTRAVENOUS

## 2023-05-28 NOTE — Telephone Encounter (Signed)
The patient returned a call and said the reason she came to the [place] office today was that she received a call around 11 AM asking her to come in at 3 PM. She even confirmed with the caller that it was for Dr. Flora Lipps in [place]. However, when she arrived at the office, she was told she didn't have an appointment. She would like to know if she needs to see Dr. Flora Lipps. Provided expectation she might receive a call back on monday

## 2023-05-28 NOTE — Telephone Encounter (Signed)
Patient identification verified by 2 forms. Shade Flood, RN     Received message from [employer],  LPN  that patient had came to office but patient did not provide a reason.   Patient had appointment yesterday at 3:40pm that was cancelled because patient was to see Michele Rockers following her ablation instead of Dr. Flora Lipps.   Tried calling patient, but no answer. LVMTCB.

## 2023-05-28 NOTE — Telephone Encounter (Signed)
Patient identification verified by 2 forms. Shade Flood, RN     Tried calling patient but went straight to VM. LVMTCB  Patient was scheduled to see Dr. Flora Lipps yesterday at 3:40pm following a triage message the patient sent. Patient was to be seen following her ablation with Dr. Michele Rockers.  Patient was already scheduled to be seen at 1:45 on 1/30 with Dr. Rhys Martini office for ablation f/u.   After conferring with Michele Rockers, his team and the patient Dr. Flora Lipps recommended patient keep appointment with Dr. Jimmey Ralph and there was not a need for her appointment with him.  Dr. Jimmey Ralph and the patient were in agreement the patient could address her concerns at the earlier visit.   The patient was not called today 1/31 regarding an appointment with Dr. Flora Lipps.

## 2023-05-29 ENCOUNTER — Other Ambulatory Visit: Payer: Self-pay | Admitting: Cardiology

## 2023-05-29 MED ORDER — COLCHICINE 0.6 MG PO TABS: 0.6000 mg | ORAL_TABLET | Freq: Every day | ORAL | 0 refills | Status: DC

## 2023-05-29 MED ORDER — PANTOPRAZOLE SODIUM 40 MG PO TBEC: 40.0000 mg | DELAYED_RELEASE_TABLET | Freq: Every day | ORAL | 0 refills | Status: DC

## 2023-05-29 MED ORDER — PREDNISONE 5 MG PO TABS: ORAL_TABLET | ORAL | 0 refills | Status: AC

## 2023-05-29 NOTE — Progress Notes (Signed)
Spoke with patient regarding results of CT scan and her blood work. Based on symptoms and temporal correlation with ablation, we will move forward with aggressive treatment for relapsing/recurrent pericarditis. She cannot be on high dose NSAIDs or aspirin due to need for Eliquis post ablation. For this reason, we will add a low dose prednisone taper in addition to a 3 month course of colchicine.

## 2023-06-01 ENCOUNTER — Telehealth: Payer: Self-pay | Admitting: Cardiovascular Disease

## 2023-06-01 NOTE — Telephone Encounter (Signed)
 Pt c/o Shortness Of Breath: STAT if SOB developed within the last 24 hours or pt is noticeably SOB on the phone  1. Are you currently SOB (can you hear that pt is SOB on the phone)?   Yes  2. How long have you been experiencing SOB?  About a week  3. Are you SOB when sitting or when up moving around?   Both  4. Are you currently experiencing any other symptoms?   Nausea  Patient has been having ongoing difficulty breathing.  Patient wanted to report to Dr. Barbaraann that her breathing issue has increased.  Patient stated her breathing has been affecting her swallowing and she feels as though she has something heavy on her chest.

## 2023-06-01 NOTE — Telephone Encounter (Signed)
 Patient identification verified by 2 forms. Jade Cooks, RN    Called and spoke to patient  Patient states:   -she had cardiac ablation on 12/24   -developed breathing issues after ablation   -breathing issues initially improved but returned on 12/30   -recently started lasix  20mg  which provided a little relief   -feels like she has pressure on chest, dull pain, difficulty breathing, feels like she will pass out   -Medications: prednisone  10mg  once daily, colchicine  0.6mg  BID  -overall feels like symptoms are worsening, not improving   -had to leave work today due to worsening symptoms  Advised patient to present to ED for evaluation  Patient states she will discuss with husband if ED is best, she will call tomorrow morning for an appointment if she does not got to the ED

## 2023-06-02 NOTE — Telephone Encounter (Signed)
 Pt called back said she could come in anything Thursday afternoon or Friday

## 2023-06-02 NOTE — Telephone Encounter (Signed)
 Called and spoke to patient. Verified name and DOB. Patient scheduled with Dr Rolm Clos 2/6 8 am.

## 2023-06-02 NOTE — Progress Notes (Signed)
 Cardiology Office Note:  .   Date:  06/03/2023  ID:  Geena Weinhold, DOB [birth year], MRN [id number] PCP: Catherine Charlies LABOR, DO  [facility] Providers Cardiologist:  Darryle ONEIDA Decent, MD Electrophysiologist:  Fonda Kitty, MD    History of Present Illness: .    Chief Complaint  Patient presents with   Follow-up   Shortness of Breath   Headache   Chest Pain   Jade [name] is a 63 y.o. female with history of Afib s/p ablation who presents for follow-up. Has post ablation pericarditis.    History of Present Illness   Jade [name] is a 63 year old female who presents with chest pain following a recent atrial fibrillation ablation. Afib ablation 04/20/2023.  She experiences chest pain described as a tightening sensation in the chest, exacerbated by deep breathing and physical exertion, such as carrying groceries. The pain worsens when lying down and improves when sitting up. She has had episodes of severe pain necessitating emergency room visits, with intermittent periods of improvement, notably two weeks of relief after her second ER visit before symptoms recurred.  She was prescribed a regimen of steroids and colchicine  for possible pericarditis. Initially, 20 mg of prednisone  provided significant relief, but symptoms worsened upon reducing the dose to 10 mg. She finds the higher dose more effective. Ibuprofen  also helps, but she is concerned about its interaction with her blood thinner, Eliquis .  CMR planned for March. She expresses concern about the delay in imaging and the impact of her symptoms on her ability to work, having had to take a day off due to discomfort.          Problem List Paroxysmal Afib  -4% burden -CHADSVASC=1  -PVI 04/20/2023 2. PACs -3% burden     ROS: All other ROS reviewed and negative. Pertinent positives noted in the HPI.     Studies Reviewed: SABRA        TTE 04/27/2023  Normal function. The left ventricle has no regional  wall motion  abnormalities.   2. Small iatrogenic ASD post ablation, flow all left to right.   3. There is no evidence of cardiac tamponade.   4. The inferior vena cava is normal in size with greater than 50%  respiratory variability, suggesting right atrial pressure of 3 mmHg.  Physical Exam:   VS:  BP 118/74 (BP Location: Left Arm, Patient Position: Sitting, Cuff Size: Normal)   Pulse 80   Ht 5' 6 (1.676 m)   Wt 222 lb (100.7 kg)   BMI 35.83 kg/m    Wt Readings from Last 3 Encounters:  06/03/23 222 lb (100.7 kg)  05/27/23 222 lb (100.7 kg)  05/21/23 224 lb 3.2 oz (101.7 kg)    GEN: Well nourished, well developed in no acute distress NECK: No JVD; No carotid bruits CARDIAC: RRR, no murmurs, rubs, gallops RESPIRATORY:  Clear to auscultation without rales, wheezing or rhonchi  ABDOMEN: Soft, non-tender, non-distended EXTREMITIES:  No edema; No deformity  ASSESSMENT AND PLAN: .   Assessment and Plan    Post-Afib Ablation Pericarditis Persistent chest pain, exacerbated by exertion and deep inspiration, and relieved by sitting up. Symptoms initially improved with 20mg  prednisone  but worsened when dose was reduced to 10mg . Inflammatory markers are negative. -Increase prednisone  to 20mg  daily for 7 days, then taper to 15mg  daily for 7 days, 10mg  daily for 7 days, 5mg  daily for 7 days, 2.5mg  daily for 7 days, then discontinue. -Continue colchicine . -Expedite cardiac MRI. -If no  improvement, consider Rilonacept. -Follow-up in 6 weeks.   Paroxysmal Afib -no recurrence of Afib -on eliquis  for 3 months -CHADSVASC=0, so no long term AC indciated -has to avoid NSAIDS              Follow-up: Return in about 6 weeks (around 07/15/2023).  Signed, Darryle DASEN. Barbaraann, MD, [facility]  [facility]  [address] [place], [place] [address] [phone]  8:47 AM

## 2023-06-02 NOTE — Telephone Encounter (Signed)
Called patient with no answer. Left message to return call to get scheduled with Dr Val Eagle' neal on 2/7 at 8 am

## 2023-06-02 NOTE — Telephone Encounter (Signed)
   Pt is returning call, she said she will be with a patient as well but will have her phone on and will be waiting for a callback

## 2023-06-03 ENCOUNTER — Encounter: Payer: Self-pay | Admitting: Cardiovascular Disease

## 2023-06-03 ENCOUNTER — Other Ambulatory Visit (HOSPITAL_COMMUNITY): Payer: Self-pay | Admitting: Emergency Medicine

## 2023-06-03 ENCOUNTER — Ambulatory Visit: Payer: BC Managed Care – PPO | Attending: Cardiovascular Disease | Admitting: Cardiovascular Disease

## 2023-06-03 VITALS — BP 118/74 | HR 80 | Ht 66.0 in | Wt 222.0 lb

## 2023-06-03 DIAGNOSIS — I300 Acute nonspecific idiopathic pericarditis: Secondary | ICD-10-CM | POA: Diagnosis not present

## 2023-06-03 DIAGNOSIS — F40240 Claustrophobia: Secondary | ICD-10-CM

## 2023-06-03 MED ORDER — PREDNISONE 5 MG PO TABS: ORAL_TABLET | ORAL | 0 refills | Status: AC

## 2023-06-03 MED ORDER — LORAZEPAM 0.5 MG PO TABS: 0.5000 mg | ORAL_TABLET | Freq: Once | ORAL | 0 refills | Status: AC

## 2023-06-03 NOTE — Patient Instructions (Addendum)
 Medication Instructions:   STOP Ibuprofen     Start Prednisone  Taper  20mg  daily for 7 days, then 15mg  daily for 7 days  10mg  daily for 7 days 5mg  daily for 7 days  2.5mg  daily for 7 days  Then stop   *If you need a refill on your cardiac medications before your next appointment, please call your pharmacy*   Lab Work: None    If you have labs (blood work) drawn today and your tests are completely normal, you will receive your results only by: MyChart Message (if you have MyChart) OR A paper copy in the mail If you have any lab test that is abnormal or we need to change your treatment, we will call you to review the results.   Testing/Procedures: None    Follow-Up: At [facility], you and your health needs are our priority.  As part of our continuing mission to provide you with exceptional heart care, we have created designated Provider Care Teams.  These Care Teams include your primary Cardiologist (physician) and Advanced Practice Providers (APPs -  Physician Assistants and Nurse Practitioners) who all work together to provide you with the care you need, when you need it.  We recommend signing up for the patient portal called MyChart.  Sign up information is provided on this After Visit Summary.  MyChart is used to connect with patients for Virtual Visits (Telemedicine).  Patients are able to view lab/test results, encounter notes, upcoming appointments, etc.  Non-urgent messages can be sent to your provider as well.   To learn more about what you can do with MyChart, go to [email or web address].    Your next appointment:   6 week(s): okay to overbook   The format for your next appointment:   In Person  Provider:   Darryle ONEIDA Decent, MD    Other Instructions

## 2023-06-04 ENCOUNTER — Encounter: Payer: Self-pay | Admitting: Family Medicine

## 2023-06-04 ENCOUNTER — Ambulatory Visit: Payer: BC Managed Care – PPO | Admitting: Family Medicine

## 2023-06-04 ENCOUNTER — Other Ambulatory Visit: Payer: Self-pay | Admitting: Cardiology

## 2023-06-04 ENCOUNTER — Ambulatory Visit (HOSPITAL_COMMUNITY)
Admission: RE | Admit: 2023-06-04 | Discharge: 2023-06-04 | Disposition: A | Discharge status: Home / Self Care | Payer: BC Managed Care – PPO | Source: Ambulatory Visit | Attending: Cardiology

## 2023-06-04 VITALS — BP 118/80 | HR 86 | Temp 98.2°F | Wt 221.2 lb

## 2023-06-04 DIAGNOSIS — R072 Precordial pain: Secondary | ICD-10-CM | POA: Diagnosis not present

## 2023-06-04 DIAGNOSIS — K2900 Acute gastritis without bleeding: Secondary | ICD-10-CM

## 2023-06-04 DIAGNOSIS — R1013 Epigastric pain: Secondary | ICD-10-CM

## 2023-06-04 MED ORDER — GADOBUTROL 1 MMOL/ML IV SOLN
10.0000 mL | Freq: Once | INTRAVENOUS | Status: AC | PRN
  Administered 2023-06-04: 10 mL via INTRAVENOUS

## 2023-06-04 MED ORDER — OMEPRAZOLE 40 MG PO CPDR: 40.0000 mg | DELAYED_RELEASE_CAPSULE | Freq: Every day | ORAL | 3 refills | Status: DC

## 2023-06-04 NOTE — Progress Notes (Signed)
 Jade [name] , Oct 07, 1960, 63 y.o., female MRN: [id number] Patient Care Team    Relationship Specialty Notifications Start End  Catherine Charlies LABOR, DO PCP - General Family Medicine  01/27/23   O'Neal, Darryle Ned, MD PCP - Cardiology Cardiology  01/04/23   Kennyth Chew, MD PCP - Electrophysiology Cardiology  02/01/23   Abran Norleen SAILOR, MD Consulting Physician Gastroenterology  05/14/17   Claudene Lenis, OD Referring Physician Optometry  05/14/17   Elizabeth Silvano HERO, MD Consulting Physician Allergy  05/14/17     Chief Complaint  Patient presents with   Abdominal Pain    Pt stated she feels pain had improved; pain increased again last night and today. Noticed mostly right after eating.      Subjective: Jade [name] is a 63 y.o. Pt presents for an OV to discuss abd pain.  She was seen  mid December and at that time with complained of right upper quadrant pain that had been sharp and intermittent over 2 weeks duration.  She had not had this type of discomfort in the past. She states the pain seems to be occurring about 15-20 minutes after heavier meals.  Pain is sharp and jabbing pain lasts for a few moments until she is able to reposition, and then seems to slowly decrease in intensity, lasting few minutes before complete resolution.   She denies any fevers or chills.   She denies nausea or vomit.    Initially pain ahd improved with short-term omeprazole  use and dietary modifications, until last night and today pain has resurfaced- again after meals.  She again describes the pain as sharp pain.  Patient reports she is being treated for pericarditis with initially colchicine , but now prednisone  taper over the next 4 weeks.  She started the prednisone  5 days ago, and GI symptoms started 2 days ago  US  04/16/2023: IMPRESSION: 1. Increased hepatic parenchymal echogenicity suggestive of steatosis. 2. No cholelithiasis or sonographic evidence for acute cholecystitis.         06/04/2023    1:58 PM 02/08/2023    9:42 AM 10/09/2022    2:44 PM 09/19/2021    8:09 AM 08/19/2021    4:14 PM  Depression screen PHQ 2/9  Decreased Interest 0 1 0 0 0  Down, Depressed, Hopeless 0 0 0 0 0  PHQ - 2 Score 0 1 0 0 0  Altered sleeping 0      Tired, decreased energy 0      Change in appetite 0      Feeling bad or failure about yourself  0      Trouble concentrating 0      Moving slowly or fidgety/restless 0      Suicidal thoughts 0      PHQ-9 Score 0      Difficult doing work/chores Not difficult at all        Allergies  Allergen Reactions   Ciprofloxacin Anaphylaxis   Sulfa Antibiotics     hives   Social History   Social History Narrative   Married. One child.   College-educated, works as a armed forces operational officer.   Smoke alarm in the home. Wears her seatbelt.   Feels safe in her relationship.   Past Medical History:  Diagnosis Date   Anxiety    Asthma    uses inhaler prn   Chest pain    Colon polyps    Diarrhea    with constipation while taking diet pills (Alli)  History of rectal bleeding     2 times in past   HSV-2 (herpes simplex virus 2) infection    Palpitations    in past/due to Advil  pm   Vitamin D  deficiency    Past Surgical History:  Procedure Laterality Date   ABDOMINAL HYSTERECTOMY  1998   fibroids. partial   ATRIAL FIBRILLATION ABLATION N/A 04/20/2023   Procedure: ATRIAL FIBRILLATION ABLATION;  Surgeon: Kennyth Chew, MD;  Location: [facility] INVASIVE CV LAB;  Service: Cardiovascular;  Laterality: N/A;   CESAREAN SECTION     1 time   COLONOSCOPY W/ POLYPECTOMY  2017   RESECTOSCOPIC MYOMECTOMY     Family History  Problem Relation Age of Onset   Asthma Mother    Diabetes Father    Hypertension Father    Heart disease Father    Prostate cancer Father    Atrial fibrillation Sister    Thyroid  disease Sister    Atrial fibrillation Brother    Diabetes Paternal Uncle    Lung cancer Paternal Grandfather    Colon cancer Neg Hx    Colon polyps  Neg Hx    Rectal cancer Neg Hx    Stomach cancer Neg Hx    Esophageal cancer Neg Hx    Allergies as of 06/04/2023       Reactions   Ciprofloxacin Anaphylaxis   Sulfa Antibiotics    hives        Medication List        Accurate as of June 04, 2023  2:36 PM. If you have any questions, ask your nurse or doctor.          albuterol 108 (90 Base) MCG/ACT inhaler Commonly known as: VENTOLIN HFA Inhale into the lungs every 6 (six) hours as needed for wheezing or shortness of breath.   apixaban  5 MG Tabs tablet Commonly known as: ELIQUIS  Take 1 tablet (5 mg total) by mouth 2 (two) times daily.   colchicine  0.6 MG tablet Take 1 tablet (0.6 mg total) by mouth daily.   furosemide  20 MG tablet Commonly known as: Lasix  Take 1 tablet (20 mg total) by mouth daily as needed (wt gain/shortness of breath).   omeprazole  40 MG capsule Commonly known as: PRILOSEC Take 1 capsule (40 mg total) by mouth daily. Started by: Shylin Keizer   predniSONE  5 MG tablet Commonly known as: DELTASONE  Take 2 tablets (10 mg total) by mouth daily with breakfast for 7 days, THEN 1.5 tablets (7.5 mg total) daily with breakfast for 7 days, THEN 1 tablet (5 mg total) daily with breakfast for 7 days, THEN 0.5 tablets (2.5 mg total) daily with breakfast for 7 days. Start taking on: May 29, 2023   predniSONE  5 MG tablet Commonly known as: DELTASONE  Take 4 tablets (20 mg total) by mouth daily with breakfast for 7 days, THEN 3 tablets (15 mg total) daily with breakfast for 7 days, THEN 2 tablets (10 mg total) daily with breakfast for 7 days, THEN 1 tablet (5 mg total) daily with breakfast for 7 days, THEN 0.5 tablets (2.5 mg total) daily with breakfast for 7 days. Start taking on: June 03, 2023   valACYclovir  500 MG tablet Commonly known as: VALTREX  Take 1 tablet (500 mg total) by mouth daily. What changed:  when to take this reasons to take this        All past medical history, surgical  history, allergies, family history, immunizations andmedications were updated in the EMR today and reviewed under the history  and medication portions of their EMR.     ROS Negative, with the exception of above mentioned in HPI   Objective:  BP 118/80   Pulse 86   Temp 98.2 F (36.8 C)   Wt 221 lb 3.2 oz (100.3 kg)   SpO2 98%   BMI 35.70 kg/m  Body mass index is 35.7 kg/m. Physical Exam Vitals and nursing note reviewed.  Constitutional:      General: She is not in acute distress.    Appearance: Normal appearance. She is normal weight. She is not ill-appearing or toxic-appearing.  HENT:     Head: Normocephalic and atraumatic.  Eyes:     General: No scleral icterus.       Right eye: No discharge.        Left eye: No discharge.     Extraocular Movements: Extraocular movements intact.     Conjunctiva/sclera: Conjunctivae normal.     Pupils: Pupils are equal, round, and reactive to light.  Abdominal:     General: Abdomen is flat. Bowel sounds are normal. There is no distension.     Palpations: Abdomen is soft. There is no shifting dullness, hepatomegaly or mass.     Tenderness: There is no abdominal tenderness. There is no guarding or rebound. Negative signs include Murphy's sign and McBurney's sign.  Skin:    Findings: No rash.  Neurological:     Mental Status: She is alert and oriented to person, place, and time. Mental status is at baseline.     Motor: No weakness.     Coordination: Coordination normal.     Gait: Gait normal.  Psychiatric:        Mood and Affect: Mood normal.        Behavior: Behavior normal.        Thought Content: Thought content normal.        Judgment: Judgment normal.    No results found for this or any previous visit (from the past 24 hours).  Assessment/Plan: Manhattan Mccuen is a 63 y.o. female present for OV for  Epigastric pain/gastritis Restart omeprazole  40 mg daily while on prednisone  and continue at least 1 week after stopping  prednisone .  Epigastric/gastritis symptoms resurfaced after anti-inflammatory started for pericarditis. Reassured patient her abdominal ultrasound did not show evidence of her having gallstones. - Comp Met (CMET)>WNL - CBC-WNL - C-reactive protein- WNL - US  Abdomen Complete: hepatic steatosis only.  - omeprazole  (PRILOSEC) 40 MG capsule; Take 1 capsule (40 mg total) by mouth daily.  Dispense: 30 capsule; Refill: 3 -Patient was encouraged to make sure she is taking her other medications at least 2 hours before or 2 hours after taking the above prescribed medications. If stools change become black tarry or blood per rectum patient is to be seen ASAP   Reviewed expectations re: course of current medical issues. Discussed self-management of symptoms. Outlined signs and symptoms indicating need for more acute intervention. Patient verbalized understanding and all questions were answered. Patient received an After-Visit Summary.    No orders of the defined types were placed in this encounter.  Meds ordered this encounter  Medications   omeprazole  (PRILOSEC) 40 MG capsule    Sig: Take 1 capsule (40 mg total) by mouth daily.    Dispense:  30 capsule    Refill:  3   Referral Orders  No referral(s) requested today     Note is dictated utilizing voice recognition software. Although note has been proof read prior to signing, occasional  typographical errors still can be missed. If any questions arise, please do not hesitate to call for verification.   electronically signed by:  Charlies Bellini, DO  [facility] [place]

## 2023-06-06 ENCOUNTER — Encounter: Payer: Self-pay | Admitting: Cardiology

## 2023-06-07 ENCOUNTER — Telehealth: Payer: Self-pay | Admitting: Cardiovascular Disease

## 2023-06-07 MED ORDER — RIVAROXABAN 20 MG PO TABS: 20.0000 mg | ORAL_TABLET | Freq: Every day | ORAL | 3 refills | Status: DC

## 2023-06-07 NOTE — Telephone Encounter (Signed)
 Patient identification verified by 2 forms. Hilton Lucky, RN    Called and spoke to patient  Patient states:   -received message from [name] that MRI was normal and no pericarditis   -she continues to have chest tightness and SOB   -she is taking colchicine  and prednisone  with some improvements   -was not sure if Eliquis  could be causing symptoms   -[name] recommended changing to Xarelto    -she would like Dr. Rolm Clos input prior to making any changes  Patient verbalized understanding, no questions at this time

## 2023-06-07 NOTE — Telephone Encounter (Signed)
 Pt c/o medication issue:  1. Name of Medication: Xarelto    2. How are you currently taking this medication (dosage and times per day)?   3. Are you having a reaction (difficulty breathing--STAT)?   4. What is your medication issue? Patient is requesting call back from Dr. Edsel Grace nurses in regards to suggested med change.

## 2023-06-08 NOTE — Telephone Encounter (Signed)
Sande Rives, MD     That is ok to try.  Gerri Spore T. Flora Lipps, MD, [facility]  [facility] [address] [place], [place] [address] [phone] 6:10 PM  __________________________________________________________________ Patient identification verified by 2 forms. Marilynn Rail, RN    Called and spoke to patient  Relayed provider message  Patient verbalized understanding, no questions at this time

## 2023-06-21 DIAGNOSIS — J301 Allergic rhinitis due to pollen: Secondary | ICD-10-CM | POA: Diagnosis not present

## 2023-06-21 DIAGNOSIS — J4550 Severe persistent asthma, uncomplicated: Secondary | ICD-10-CM | POA: Diagnosis not present

## 2023-06-21 DIAGNOSIS — R0602 Shortness of breath: Secondary | ICD-10-CM | POA: Diagnosis not present

## 2023-06-21 DIAGNOSIS — J45909 Unspecified asthma, uncomplicated: Secondary | ICD-10-CM | POA: Diagnosis not present

## 2023-06-21 DIAGNOSIS — J020 Streptococcal pharyngitis: Secondary | ICD-10-CM | POA: Diagnosis not present

## 2023-06-21 DIAGNOSIS — J3089 Other allergic rhinitis: Secondary | ICD-10-CM | POA: Diagnosis not present

## 2023-06-21 DIAGNOSIS — Z03818 Encounter for observation for suspected exposure to other biological agents ruled out: Secondary | ICD-10-CM | POA: Diagnosis not present

## 2023-07-09 ENCOUNTER — Other Ambulatory Visit (HOSPITAL_COMMUNITY): Payer: BC Managed Care – PPO

## 2023-07-13 ENCOUNTER — Ambulatory Visit: Payer: BC Managed Care – PPO | Admitting: Physician Assistant

## 2023-07-14 NOTE — Progress Notes (Unsigned)
  Cardiology Office Note:  .   Date:  07/14/2023  ID:  Jade [name], DOB Jun 05, 1960, MRN [id number] PCP: Natalia Leatherwood, DO  [facility] Providers Cardiologist:  Reatha Harps, MD Electrophysiologist:  Nobie Putnam, MD { Click to update primary MD,subspecialty MD or APP then REFRESH:1}   History of Present Illness: .   No chief complaint on file.   Jade [name] is a 63 y.o. female with history of pAF who presents for follow-up.      Problem List Paroxysmal Afib  -4% burden -CHADSVASC=1  -PVI 04/20/2023 2. PACs -3% burden  3. Post-Af ablation pericarditis     ROS: All other ROS reviewed and negative. Pertinent positives noted in the HPI.     Studies Reviewed: Marland Kitchen       CMR 06/04/2023 IMPRESSION: 1. Normal LV size and function LVEF 54%   2. No delayed gadolinium enhancement in myocardium or pericardium No pericardial effusion   3.  Normal RV size and function RVEF 54%   4.  Mild LAE   5.  Trivial MR   6.  Estimated cardiac output 4.5 L/min   7.  Normal T1/T2 with nonspecific mildly dilated ECV 33% Physical Exam:   VS:  There were no vitals taken for this visit.   Wt Readings from Last 3 Encounters:  06/04/23 221 lb 3.2 oz (100.3 kg)  06/03/23 222 lb (100.7 kg)  05/27/23 222 lb (100.7 kg)    GEN: Well nourished, well developed in no acute distress NECK: No JVD; No carotid bruits CARDIAC: ***RRR, no murmurs, rubs, gallops RESPIRATORY:  Clear to auscultation without rales, wheezing or rhonchi  ABDOMEN: Soft, non-tender, non-distended EXTREMITIES:  No edema; No deformity  ASSESSMENT AND PLAN: .   ***    {Are you ordering a CV Procedure (e.g. stress test, cath, DCCV, TEE, etc)?   Press F2        :[id number]}   Follow-up: No follow-ups on file.  Time Spent with Patient: I have spent a total of *** minutes caring for this patient today face to face, ordering and reviewing labs/tests, reviewing prior records/medical history, examining the patient,  establishing an assessment and plan, communicating results/findings to the patient/family, and documenting in the medical record.   Signed, Lenna Gilford. Flora Lipps, MD, [facility]  [facility]  [address] [place], [place] [address] [phone]  8:25 AM

## 2023-07-15 ENCOUNTER — Encounter: Payer: Self-pay | Admitting: Cardiovascular Disease

## 2023-07-15 ENCOUNTER — Ambulatory Visit: Payer: BC Managed Care – PPO | Attending: Cardiovascular Disease | Admitting: Cardiovascular Disease

## 2023-07-15 VITALS — BP 106/74 | HR 86 | Ht 66.0 in | Wt 225.0 lb

## 2023-07-15 DIAGNOSIS — R072 Precordial pain: Secondary | ICD-10-CM | POA: Diagnosis not present

## 2023-07-15 DIAGNOSIS — I480 Paroxysmal atrial fibrillation: Secondary | ICD-10-CM

## 2023-07-15 DIAGNOSIS — R0602 Shortness of breath: Secondary | ICD-10-CM

## 2023-07-15 DIAGNOSIS — I300 Acute nonspecific idiopathic pericarditis: Secondary | ICD-10-CM

## 2023-07-15 NOTE — Progress Notes (Unsigned)
 Cardiology Office Note:  .   Date:  07/15/2023  ID:  Jade [name], DOB [birth year], MRN [id number] PCP: Jade Leatherwood, DO  [facility] Providers Cardiologist:  Jade Harps, MD Electrophysiologist:  Jade Putnam, MD {  History of Present Illness: Jade [name]   Jade [name] is a 63 y.o. female w/PMHx of AFib  AFib ablation 04/20/23  ER 04/27/23 Post ablation pericarditis TTE noted LVEF 55-60%, no WMA< no pericardial effusion Symptom better after toradol Elevated Trops as expected post ablation Normal coronary arteries by CT known for her Discharged 04/23/23 On colchicine 0.6mg  daily for a month  called office service 04/25/23, pt reported initially symptoms much improved initially though this day reported Asked her to start ibuprofen 800 mg tid in addition to colchicine 0.6 mg bid   ER 04/27/23 Escalating symptoms, heavy chest, SOB >> as well as nausea followed by feeling weak, clammy, legs/feet feel weak like they will give out  Pericarditis Post-ablation Patient reassured. Will take measures to avoid triggers (reclining) Continue NSAID, which she appears to be tolerating  Nausea, GI distress Started after colchicine, likely side-effect Will hold colchicine for now Weakness, diaphoresis, pallor Consistent with vagal episodes triggered by nausea and pain   Seeing AFib clinic a couple times and Dr. Jimmey [name] most recently on 05/27/23, continued with symptoms, colchicine and NSAI giving her only temporary relief CT scan of chest.  - Repeat CBC, CRP.  - If CRP elevated then empirically add steroids for pericarditis and order cardiac MRI to look for pericardial inflammation.  - Continue colchicine and ibuprofen for now.  Will cover with GI prophylaxis - pantoprazole and sucralfate.  Discussed stopping Eliquis at the 3 mo mark  05/29/23: Dr. Jimmey [name]: based on symptoms and temporal correlation with ablation, we will move forward with aggressive treatment for  relapsing/recurrent pericarditis. She cannot be on high dose NSAIDs or aspirin due to need for Eliquis post ablation. For this reason, we will add a low dose prednisone taper in addition to a 3 month course of colchicine   Saw Dr. Scharlene [name] 06/03/23 Initially, 20 mg of prednisone provided significant relief, but symptoms worsened upon reducing the dose to 10 mg. She finds the higher dose more effective. Ibuprofen also helps, but she is concerned about its interaction with her blood thinner, Eliquis. CMR planned for March. She expresses concern about the delay in imaging and the impact of her symptoms on her ability to work, having had to take a day off due to discomfort.   06/04/23: c.MRI negative for pericarditis ? If symptoms 2/2 Eliquis > Xarelto   Today's visit is scheduled as her 3 mo post ablation visit,    ROS:   Thankfully she is doing better The day she made the switch to xarelto she felt better She does not think she has had AFib, on a efew occasions through the last few months has had some awareness of her HR towards 110's but nothing like her AFib Since feeling better  Has done some gardening, lifting heavy bags of dirt etc.. does not have the same exertional capacity she did, but suspects deconditioned from the last 3 mo of being so sedentary..   Arrhythmia/AAD hx AFib/PV ATach Flecainide prescribed Oct 2024 >> never started  Studies Reviewed: Jade [name]    EKG done today and reviewed by myself:  SR 85bpm, low voltage  06/04/23; c.MRI IMPRESSION: 1. Normal LV size and function LVEF 54% 2. No delayed gadolinium enhancement in myocardium or pericardium No pericardial effusion  3.  Normal RV size and function RVEF 54% 4.  Mild LAE 5.  Trivial MR 6.  Estimated cardiac output 4.5 L/min 7.  Normal T1/T2 with nonspecific mildly dilated ECV 33%  05/28/23: Chest CT IMPRESSION: 1. No evidence of pulmonary embolism. 2. Mild mosaic attenuation of the lungs, which can be seen in the setting  of small airways disease. 3. Prominent left subpectoral lymph node measuring 10 mm short axis, nonspecific. Correlation with recent mammogram is recommended. 4. Aortic atherosclerosis (ICD10-I70.0).  Echo 04/27/23:  1. Left ventricular ejection fraction, by estimation, is 55 to 60%. The  left ventricle has normal function. The left ventricle has no regional  wall motion abnormalities.   2. Small iatrogenic ASD post ablation, flow all left to right.   3. There is no evidence of cardiac tamponade.   4. The inferior vena cava is normal in size with greater than 50%  respiratory variability, suggesting right atrial pressure of 3 mmHg.    PVI ablation 04/20/23 (PFA) CONCLUSIONS: 1. Successful PVI 2. Successful ablation/isolation of the posterior wall 3. Intracardiac echo reveals normal LV size and function, trivial pericardial effusion, normal PV anatomy. 4. No early apparent complications. 5. Resume Eliquis in 2 hours.   01/29/23: Coronary CT FINDINGS: Non-cardiac: See separate report from [facility]. Normal caliber ascending aorta. Pulmonary veins drain normally to the left atrium. Calcium Score: 0 Agatston units. Coronary Arteries: Right dominant with no anomalies LM: No plaque or stenosis. LAD system:  No plaque or stenosis. Circumflex system: No plaque or stenosis. RCA system:  No plaque or stenosis.   IMPRESSION: 1. Coronary artery calcium score 0 Agatston units. This suggests low risk for future cardiac events. 2.  No significant coronary artery disease noted.  Risk Assessment/Calculations:    Physical Exam:   VS:  There were no vitals taken for this visit.   Wt Readings from Last 3 Encounters:  06/04/23 221 lb 3.2 oz (100.3 kg)  06/03/23 222 lb (100.7 kg)  05/27/23 222 lb (100.7 kg)    GEN: Well nourished, well developed in no acute distress NECK: No JVD; No carotid bruits CARDIAC: RRR, no murmurs, rubs, gallops RESPIRATORY:   CTA b/l without rales,  wheezing or rhonchi  ABDOMEN: Soft, non-tender, non-distended EXTREMITIES:  No edema; No deformity   ASSESSMENT AND PLAN: .    paroxysmal AFib CHA2DS2Vasc is one (for aortic atherosclerosis seen on CT) Post ablation pericarditis  Stopping Eliquis seemed to abruptly/clearly make her feel better Hard not to make that connection, have not seen that before clinically, but added as an allergy for her  3 mo post ablation will be 07/19/23 > will stop her Xarelto 3/25    Dispo: back in 6-8 weeks sooner if needed  Signed, Sheilah Pigeon, PA-C

## 2023-07-15 NOTE — Patient Instructions (Signed)
 Medication Instructions:  Your physician recommends that you continue on your current medications as directed. Please refer to the Current Medication list given to you today.    *If you need a refill on your cardiac medications before your next appointment, please call your pharmacy*   Lab Work:  NONE   If you have labs (blood work) drawn today and your tests are completely normal, you will receive your results only by: MyChart Message (if you have MyChart) OR A paper copy in the mail If you have any lab test that is abnormal or we need to change your treatment, we will call you to review the results.   Testing/Procedures: NONE    Follow-Up: At [facility], you and your health needs are our priority.  As part of our continuing mission to provide you with exceptional heart care, we have created designated Provider Care Teams.  These Care Teams include your primary Cardiologist (physician) and Advanced Practice Providers (APPs -  Physician Assistants and Nurse Practitioners) who all work together to provide you with the care you need, when you need it.  We recommend signing up for the patient portal called "MyChart".  Sign up information is provided on this After Visit Summary.  MyChart is used to connect with patients for Virtual Visits (Telemedicine).  Patients are able to view lab/test results, encounter notes, upcoming appointments, etc.  Non-urgent messages can be sent to your provider as well.   To learn more about what you can do with MyChart, go to [email or web address].    Your next appointment:   6 month(s)  The format for your next appointment:   In Person  Provider:   Edd Fabian, FNP, Micah Flesher, PA-C, Marjie Skiff, PA-C, Robet Leu, PA-C, Juanda Crumble, PA-C, Joni Reining, DNP, ANP, Azalee Course, PA-C, Bernadene Person, NP, or Reather Littler, NP       Other Instructions

## 2023-07-16 ENCOUNTER — Encounter: Payer: Self-pay | Admitting: Physician Assistant

## 2023-07-16 ENCOUNTER — Ambulatory Visit: Payer: BC Managed Care – PPO | Admitting: Physician Assistant

## 2023-07-16 VITALS — BP 102/60 | HR 85 | Ht 66.0 in

## 2023-07-16 DIAGNOSIS — I480 Paroxysmal atrial fibrillation: Secondary | ICD-10-CM

## 2023-07-16 DIAGNOSIS — I308 Other forms of acute pericarditis: Secondary | ICD-10-CM

## 2023-07-16 NOTE — Patient Instructions (Addendum)
 Medication Instructions:   STOP TAKING :  XARELTO  ON  07-19-24    *If you need a refill on your cardiac medications before your next appointment, please call your pharmacy*   Lab Work:  NONE ORDERED  TODAY     If you have labs (blood work) drawn today and your tests are completely normal, you will receive your results only by: MyChart Message (if you have MyChart) OR A paper copy in the mail If you have any lab test that is abnormal or we need to change your treatment, we will call you to review the results.   Testing/Procedures:  NONE ORDERED  TODAY     Follow-Up: At [facility], you and your health needs are our priority.  As part of our continuing mission to provide you with exceptional heart care, we have created designated Provider Care Teams.  These Care Teams include your primary Cardiologist (physician) and Advanced Practice Providers (APPs -  Physician Assistants and Nurse Practitioners) who all work together to provide you with the care you need, when you need it.  We recommend signing up for the patient portal called "MyChart".  Sign up information is provided on this After Visit Summary.  MyChart is used to connect with patients for Virtual Visits (Telemedicine).  Patients are able to view lab/test results, encounter notes, upcoming appointments, etc.  Non-urgent messages can be sent to your provider as well.   To learn more about what you can do with MyChart, go to [email or web address].    Your next appointment:    6 -8 week(s) ( CONTACT  CASSIE HALL/ ANGELINE HAMMER FOR EP SCHEDULING ISSUES )   Provider:     You may see Nobie Putnam, MD or one of the following Advanced Practice Providers on your designated Care Team:   Francis Dowse, [place]   Other Instructions

## 2023-07-22 ENCOUNTER — Ambulatory Visit
Admission: RE | Admit: 2023-07-22 | Discharge: 2023-07-22 | Disposition: A | Discharge status: Home / Self Care | Payer: BC Managed Care – PPO | Source: Ambulatory Visit | Attending: Family Medicine | Admitting: Family Medicine

## 2023-07-22 DIAGNOSIS — Z1231 Encounter for screening mammogram for malignant neoplasm of breast: Secondary | ICD-10-CM

## 2023-07-23 ENCOUNTER — Telehealth: Payer: Self-pay

## 2023-07-23 NOTE — Telephone Encounter (Signed)
 Reason for CRM: pt called to follow  up on a mamma gram, for breast center in [place]. Please call pt back at [phone]      LVM for pt. No results for mammogram have been received.

## 2023-07-27 ENCOUNTER — Other Ambulatory Visit: Payer: Self-pay | Admitting: Family Medicine

## 2023-07-27 DIAGNOSIS — R9389 Abnormal findings on diagnostic imaging of other specified body structures: Secondary | ICD-10-CM

## 2023-07-27 DIAGNOSIS — R599 Enlarged lymph nodes, unspecified: Secondary | ICD-10-CM

## 2023-08-04 ENCOUNTER — Telehealth: Payer: Self-pay

## 2023-08-04 NOTE — Telephone Encounter (Signed)
 Pt asking for recommendations for GYN. Advised pt she may not need referral but if required by insurance, she will need appt

## 2023-08-04 NOTE — Telephone Encounter (Signed)
 LM for pt to returncall

## 2023-08-04 NOTE — Telephone Encounter (Signed)
 We have many patients that seem pleased with [facility] [address]

## 2023-08-04 NOTE — Telephone Encounter (Signed)
 Copied from CRM [id number]. Topic: Referral - Request for Referral >> Jul 28, 2023 12:52 PM Lovey Newcomer R wrote: Did the patient discuss referral with their provider in the last year? No (If No - schedule appointment) (If Yes - send message)  Appointment offered? Yes, but patient declined appointment for this referral.  Type of order/referral and detailed reason for visit: Female Gynecologist. Not happy with the current one.  Preference of office, provider, location: Asking if Dr. Claiborne Billings can suggest one.  If referral order, have you been seen by this specialty before? Yes (If Yes, this issue or another issue? When? Where? Currently  Can we respond through MyChart? Yes

## 2023-08-20 ENCOUNTER — Other Ambulatory Visit: Payer: Self-pay | Admitting: Family Medicine

## 2023-08-20 ENCOUNTER — Ambulatory Visit
Admission: RE | Admit: 2023-08-20 | Discharge: 2023-08-20 | Disposition: A | Discharge status: Home / Self Care | Source: Ambulatory Visit | Attending: Family Medicine | Admitting: Family Medicine

## 2023-08-20 ENCOUNTER — Other Ambulatory Visit (HOSPITAL_COMMUNITY): Payer: Self-pay | Admitting: Physician Assistant

## 2023-08-20 DIAGNOSIS — R9389 Abnormal findings on diagnostic imaging of other specified body structures: Secondary | ICD-10-CM

## 2023-08-20 DIAGNOSIS — R599 Enlarged lymph nodes, unspecified: Secondary | ICD-10-CM

## 2023-08-20 DIAGNOSIS — R590 Localized enlarged lymph nodes: Secondary | ICD-10-CM | POA: Diagnosis not present

## 2023-08-23 NOTE — Telephone Encounter (Signed)
 Dr. Salem Crater pt. This RX was prescribed by [facility]. Does Dr. Rolm Clos want to refill? Please advise.

## 2023-08-25 ENCOUNTER — Other Ambulatory Visit: Payer: Self-pay | Admitting: Cardiology

## 2023-08-27 ENCOUNTER — Encounter: Payer: Self-pay | Admitting: Family Medicine

## 2023-08-27 ENCOUNTER — Ambulatory Visit: Admitting: Family Medicine

## 2023-08-27 VITALS — BP 102/74 | HR 86 | Temp 98.2°F | Wt 221.4 lb

## 2023-08-27 DIAGNOSIS — R768 Other specified abnormal immunological findings in serum: Secondary | ICD-10-CM | POA: Diagnosis not present

## 2023-08-27 DIAGNOSIS — M2550 Pain in unspecified joint: Secondary | ICD-10-CM

## 2023-08-27 DIAGNOSIS — M79669 Pain in unspecified lower leg: Secondary | ICD-10-CM | POA: Insufficient documentation

## 2023-08-27 DIAGNOSIS — I480 Paroxysmal atrial fibrillation: Secondary | ICD-10-CM | POA: Diagnosis not present

## 2023-08-27 LAB — SEDIMENTATION RATE: Sed Rate: 23 mm/h (ref 0–30)

## 2023-08-27 NOTE — Progress Notes (Signed)
 Jade [name] , October 03, 1960, 63 y.o., female MRN: [id number] Patient Care Team    Relationship Specialty Notifications Start End  Mariel Shope, DO PCP - General Family Medicine  01/27/23   O'Neal, Cathay Clonts, MD PCP - Cardiology Cardiology  01/04/23   Ardeen Kohler, MD PCP - Electrophysiology Cardiology  02/01/23   Tobin Forts, MD Consulting Physician Gastroenterology  05/14/17   Gigi Kyle, OD Referring Physician Optometry  05/14/17   Mort Ards, MD Consulting Physician Allergy  05/14/17     Chief Complaint  Patient presents with   Foot Pain    Has knot on left calf that has been there for months but started throbbing a few days go which is causing concern. Both feet are painful after shoe changes (few months), hoped it would get better but has not. Stopped blood thinner in March     Subjective: Jade [name] is a 63 y.o. Pt presents for an OV with complaints of not present mid left calf of few months duration.  Associated symptoms include over the last 1 week has noticed throbbing pain in this location.  She has a history of A-fib with recent ablation.  DOAC was discontinued about 4 weeks ago. Pt has tried nothing to ease their symptoms.  She is worried she has a blood clot. Patient reports she does not feel winded or weak like she did when she had A-fib.  She does still feel some palpitations and feels like she is about to go into A-fib but does not think she has.  Patient also complains of bilateral ankle feet, hands and finger pain.  She has had a positive ANA in the past with a low titer of 1: 40 and a positive double-stranded DNA.  She was referred to rheumatology that visit, but reports she started feeling better and never did follow through with that referral.  She would like testing for arthritis.  No family history of rheumatoid or inflammatory arthritis. Patient denies red or swollen joints.     06/04/2023    1:58 PM 02/08/2023    9:42 AM 10/09/2022     2:44 PM 09/19/2021    8:09 AM 08/19/2021    4:14 PM  Depression screen PHQ 2/9  Decreased Interest 0 1 0 0 0  Down, Depressed, Hopeless 0 0 0 0 0  PHQ - 2 Score 0 1 0 0 0  Altered sleeping 0      Tired, decreased energy 0      Change in appetite 0      Feeling bad or failure about yourself  0      Trouble concentrating 0      Moving slowly or fidgety/restless 0      Suicidal thoughts 0      PHQ-9 Score 0      Difficult doing work/chores Not difficult at all        Allergies  Allergen Reactions   Ciprofloxacin Anaphylaxis   Eliquis  [Apixaban ] Shortness Of Breath    SOB/heavy chest   Sulfa Antibiotics     hives   Social History   Social History Narrative   Married. One child.   College-educated, works as a Armed forces operational officer.   Smoke alarm in the home. Wears her seatbelt.   Feels safe in her relationship.   Past Medical History:  Diagnosis Date   Anxiety    Asthma    uses inhaler prn   Chest pain  Colon polyps    Diarrhea    with constipation while taking diet pills (Alli)   History of rectal bleeding     2 times in past   HSV-2 (herpes simplex virus 2) infection    Palpitations    in past/due to Advil  pm   Vitamin D  deficiency    Past Surgical History:  Procedure Laterality Date   ABDOMINAL HYSTERECTOMY  1998   fibroids. partial   ATRIAL FIBRILLATION ABLATION N/A 04/20/2023   Procedure: ATRIAL FIBRILLATION ABLATION;  Surgeon: Ardeen Kohler, MD;  Location: [facility] INVASIVE CV LAB;  Service: Cardiovascular;  Laterality: N/A;   CESAREAN SECTION     1 time   COLONOSCOPY W/ POLYPECTOMY  2017   RESECTOSCOPIC MYOMECTOMY     Family History  Problem Relation Age of Onset   Asthma Mother    Diabetes Father    Hypertension Father    Heart disease Father    Prostate cancer Father    Atrial fibrillation Sister    Thyroid  disease Sister    Atrial fibrillation Brother    Diabetes Paternal Uncle    Lung cancer Paternal Grandfather    Colon cancer Neg Hx    Colon  polyps Neg Hx    Rectal cancer Neg Hx    Stomach cancer Neg Hx    Esophageal cancer Neg Hx    Allergies as of 08/27/2023       Reactions   Ciprofloxacin Anaphylaxis   Eliquis  [apixaban ] Shortness Of Breath   SOB/heavy chest   Sulfa Antibiotics    hives        Medication List        Accurate as of Aug 27, 2023  4:16 PM. If you have any questions, ask your nurse or doctor.          albuterol 108 (90 Base) MCG/ACT inhaler Commonly known as: VENTOLIN HFA Inhale into the lungs every 6 (six) hours as needed for wheezing or shortness of breath.   furosemide  20 MG tablet Commonly known as: Lasix  Take 1 tablet (20 mg total) by mouth daily as needed (wt gain/shortness of breath).   rivaroxaban  20 MG Tabs tablet Commonly known as: XARELTO  Take 1 tablet (20 mg total) by mouth daily with supper.   valACYclovir  500 MG tablet Commonly known as: VALTREX  Take 1 tablet (500 mg total) by mouth daily. What changed:  when to take this reasons to take this        All past medical history, surgical history, allergies, family history, immunizations andmedications were updated in the EMR today and reviewed under the history and medication portions of their EMR.     ROS Negative, with the exception of above mentioned in HPI   Objective:  BP 102/74   Pulse 86   Temp 98.2 F (36.8 C)   Wt 221 lb 6.4 oz (100.4 kg)   SpO2 98%   BMI 35.73 kg/m  Body mass index is 35.73 kg/m. Physical Exam Vitals and nursing note reviewed.  Constitutional:      General: She is not in acute distress.    Appearance: Normal appearance. She is normal weight. She is not ill-appearing or toxic-appearing.  HENT:     Head: Normocephalic and atraumatic.  Eyes:     General: No scleral icterus.       Right eye: No discharge.        Left eye: No discharge.     Extraocular Movements: Extraocular movements intact.     Conjunctiva/sclera: Conjunctivae  normal.     Pupils: Pupils are equal, round, and  reactive to light.  Cardiovascular:     Rate and Rhythm: Normal rate and regular rhythm.     Heart sounds: No murmur heard. Pulmonary:     Effort: Pulmonary effort is normal.     Breath sounds: Normal breath sounds.  Musculoskeletal:        General: Tenderness present. No swelling. Normal range of motion.     Comments:  Left lower extremity: No erythema, no swelling, negative Homans.  Grape size small spongy mass palpated mid calf between gastrocnemius muscle.  Tender to palpation.  Neurovascularly intact distally. Bilateral ankles and feet: No erythema, no soft tissue swelling, full range of motion. Bilateral hands: No erythema, no soft tissue swelling, DIP nodules present on multiple fingers.  No ulnar deviation.  Neurovascularly intact distally  Skin:    Findings: No bruising, erythema or rash.  Neurological:     Mental Status: She is alert and oriented to person, place, and time. Mental status is at baseline.     Motor: No weakness.     Coordination: Coordination normal.     Gait: Gait normal.  Psychiatric:        Mood and Affect: Mood normal.        Behavior: Behavior normal.        Thought Content: Thought content normal.        Judgment: Judgment normal.      No results found. No results found. Results for orders placed or performed in visit on 08/27/23 (from the past 24 hours)  Sedimentation rate     Status: None   Collection Time: 08/27/23 11:27 AM  Result Value Ref Range   Sed Rate 23 0 - 30 mm/hr    Assessment/Plan: Jade [name] is a 63 y.o. female present for OV for   Calf tenderness (Primary) New problem With her history of A-fib and recent discontinuation of DOAC, need to rule out DVT as cause of discomfort.  She does state that although she does not feel like she is gone into A-fib, she has felt still some palpitations. - US  Venous Img Lower Unilateral Left; Future We discussed differential diagnosis of phlebitis, lipoma, DVT versus other as the etiology.   Exams seem to be most consistent with lipoma.  However will rule out DVT. Patient was encouraged to start baby aspirin daily if phlebitis is present this should help with symptoms. If DVT is ruled out, but she is not seeing improvement with baby aspirin daily or masses enlarging, then she will need to follow-up at that time for further evaluation and consider MRI.  Patient reports understanding  Polyarthralgia/ANA positive/double-stranded DNA positive New problem Will repeat labs today and add rheumatoid labs.  She did not follow through with rheumatology a few years ago when the double-stranded DNA was positive.  Will wait on today's lab results and consider referral if appropriate. We discussed the differences between the types of arthritis's and how they would be treated. - ANA, IFA Comprehensive Panel-([facility]) - Sedimentation rate - Cyclic citrul peptide antibody, IgG ([facility]) - Rheumatoid Factor   Reviewed expectations re: course of current medical issues. Discussed self-management of symptoms. Outlined signs and symptoms indicating need for more acute intervention. Patient verbalized understanding and all questions were answered. Patient received an After-Visit Summary.    Orders Placed This Encounter  Procedures   US  Venous Img Lower Unilateral Left   ANA, IFA Comprehensive Panel-([facility])   Sedimentation rate  Cyclic citrul peptide antibody, IgG ([facility])   Rheumatoid Factor   No orders of the defined types were placed in this encounter.  Referral Orders  No referral(s) requested today   45 minutes spent in patient counter today evaluating 2 new problems requiring multiple labs, physical exam and stat imaging  Note is dictated utilizing voice recognition software. Although note has been proof read prior to signing, occasional typographical errors still can be missed. If any questions arise, please do not hesitate to call for verification.   electronically signed by:  Napolean Backbone, DO  [facility] [place]

## 2023-08-30 ENCOUNTER — Telehealth: Payer: Self-pay | Admitting: Family Medicine

## 2023-08-30 DIAGNOSIS — R768 Other specified abnormal immunological findings in serum: Secondary | ICD-10-CM

## 2023-08-30 DIAGNOSIS — M2550 Pain in unspecified joint: Secondary | ICD-10-CM

## 2023-08-30 LAB — ANA, IFA COMPREHENSIVE PANEL
Anti Nuclear Antibody (ANA): POSITIVE — AB
ENA SM Ab Ser-aCnc: <1 AI
SM/RNP: <1 AI
SSA (Ro) (ENA) Antibody, IgG: 2.9 AI — AB
SSB (La) (ENA) Antibody, IgG: <1 AI
Scleroderma (Scl-70) (ENA) Antibody, IgG: <1 AI
ds DNA Ab: 25 [IU]/mL — ABNORMAL HIGH

## 2023-08-30 LAB — ANTI-NUCLEAR AB-TITER (ANA TITER): ANA Titer 1: 1:80 {titer} — ABNORMAL HIGH

## 2023-08-30 LAB — CYCLIC CITRUL PEPTIDE ANTIBODY, IGG: Cyclic Citrullin Peptide Ab: <16 U

## 2023-08-30 LAB — RHEUMATOID FACTOR: Rheumatoid fact SerPl-aCnc: <10 [IU]/mL (ref <14)

## 2023-08-30 MED ORDER — DICLOFENAC SODIUM 75 MG PO TBEC: 75.0000 mg | DELAYED_RELEASE_TABLET | Freq: Two times a day (BID) | ORAL | 1 refills | Status: DC

## 2023-08-30 NOTE — Telephone Encounter (Signed)
 LVM to discuss.  MyChart sent.

## 2023-08-30 NOTE — Telephone Encounter (Signed)
 Please call patient: I have placed a referral to rheumatology for her to be established and further workup for her complaints. Her lab work did indicate a autoimmune disorder being present her ANA titer increasing from last time, double-stranded DNA still positive, she also now has a Ro antibody present. This can be associated with a connective tissue disease disorder, Sjogren's syndrome, polymyositis/dermatomyositis and in some cases systemic lupus.   She has no longer on blood thinners I did call in anti-inflammatory, they can be used daily with food, to help decrease inflammation while we wait on rheumatology referral

## 2023-08-31 ENCOUNTER — Other Ambulatory Visit: Payer: Self-pay

## 2023-08-31 MED ORDER — DICLOFENAC SODIUM 75 MG PO TBEC: 75.0000 mg | DELAYED_RELEASE_TABLET | Freq: Two times a day (BID) | ORAL | 1 refills | Status: AC

## 2023-09-02 ENCOUNTER — Ambulatory Visit (HOSPITAL_BASED_OUTPATIENT_CLINIC_OR_DEPARTMENT_OTHER)
Admission: RE | Admit: 2023-09-02 | Discharge: 2023-09-02 | Disposition: A | Discharge status: Home / Self Care | Source: Ambulatory Visit | Attending: Family Medicine | Admitting: Family Medicine

## 2023-09-02 DIAGNOSIS — M79669 Pain in unspecified lower leg: Secondary | ICD-10-CM | POA: Diagnosis not present

## 2023-09-02 DIAGNOSIS — M79662 Pain in left lower leg: Secondary | ICD-10-CM | POA: Diagnosis not present

## 2023-09-02 DIAGNOSIS — I480 Paroxysmal atrial fibrillation: Secondary | ICD-10-CM | POA: Insufficient documentation

## 2023-09-03 ENCOUNTER — Encounter: Payer: Self-pay | Admitting: Family Medicine

## 2023-09-06 NOTE — Telephone Encounter (Signed)
 Placed referral again for her. I can see the referral, but it does not look like it went through.

## 2023-09-06 NOTE — Telephone Encounter (Signed)
 No further action needed.

## 2023-09-16 NOTE — Progress Notes (Signed)
 Cardiology Office Note:  .   Date:  09/16/2023  ID:  Jade [name], DOB [birth year], MRN [id number] PCP: Mariel Shope, DO  [facility] Providers Cardiologist:  Oneil Bigness, MD Electrophysiologist:  Ardeen Kohler, MD {  History of Present Illness: Jade [name]   Jade [name] is a 63 y.o. female w/PMHx of AFib  AFib ablation 04/20/23  ER 04/27/23 Post ablation pericarditis TTE noted LVEF 55-60%, no WMA< no pericardial effusion Symptom better after toradol  Elevated Trops as expected post ablation Normal coronary arteries by CT known for her Discharged 04/23/23 On colchicine  0.6mg  daily for a month  called office service 04/25/23, pt reported initially symptoms much improved initially though this day reported Asked her to start ibuprofen  800 mg tid in addition to colchicine  0.6 mg bid   ER 04/27/23 Escalating symptoms, heavy chest, SOB >> as well as nausea followed by feeling weak, clammy, legs/feet feel weak like they will give out  Pericarditis Post-ablation Patient reassured. Will take measures to avoid triggers (reclining) Continue NSAID, which she appears to be tolerating  Nausea, GI distress Started after colchicine , likely side-effect Will hold colchicine  for now Weakness, diaphoresis, pallor Consistent with vagal episodes triggered by nausea and pain   Seeing AFib clinic a couple times and [name] most recently on 05/27/23, continued with symptoms, colchicine  and NSAI giving her only temporary relief CT scan of chest.  - Repeat CBC, CRP.  - If CRP elevated then empirically add steroids for pericarditis and order cardiac MRI to look for pericardial inflammation.  - Continue colchicine  and ibuprofen  for now.  Will cover with GI prophylaxis - pantoprazole  and sucralfate .  Discussed stopping Eliquis  at the 3 mo mark  05/29/23: [name]: based on symptoms and temporal correlation with ablation, we will move forward with aggressive treatment for  relapsing/recurrent pericarditis. She cannot be on high dose NSAIDs or aspirin due to need for Eliquis  post ablation. For this reason, we will add a low dose prednisone  taper in addition to a 3 month course of colchicine    Saw [name] 06/03/23 Initially, 20 mg of prednisone  provided significant relief, but symptoms worsened upon reducing the dose to 10 mg. She finds the higher dose more effective. Ibuprofen  also helps, but she is concerned about its interaction with her blood thinner, Eliquis . CMR planned for March. She expresses concern about the delay in imaging and the impact of her symptoms on her ability to work, having had to take a day off due to discomfort.   06/04/23: c.MRI negative for pericarditis ? If symptoms 2/2 Eliquis  > Xarelto     I saw her 07/16/23 Thankfully she is doing better The day she made the switch to xarelto  she felt better She does not think she has had AFib, on a efew occasions through the last few months has had some awareness of her HR towards 110's but nothing like her AFib Since feeling better  Has done some gardening, lifting heavy bags of dirt etc.. does not have the same exertional capacity she did, but suspects deconditioned from the last 3 mo of being so sedentary.. Unusual though she felt certain the Eliquis  was the cause of her post procedure symptoms> at least the timing of resolution of symptoms was after Eliquis  was stopped Planned to stop Xarelto  after the 3 mo date  Subsequently with her PMD with lab work did indicate a autoimmune disorder being present her ANA titer increasing from last time, double-stranded DNA still positive, she also now has a  Ro antibody present.  Referred to Rheumatology  Today's visit is scheduled as a planned follow up visit ROS:   She had been doing quite well until last weekend Had  been gardening, doing some heaver physical work, without symptoms though got a sense of her AFib, heart beat felt different and a bit fast,  checked the rate was 106, relaxed some and it settled away Did feel like her AFib, was alarming/worrisome to her Monday at work [employer]) was dong nothing out of the ordinary started to get tightness in her chest making her SOB, eventually took off her bra feeling a some relief.Similar symptoms Tuesday, none since  She does not think this symptoms is anxiety related, doesn't feel like she has anything in her life to feel anxious about, family/work life are good, she is nearing retirement and really looking forward to it   Arrhythmia/AAD hx AFib/PV ATach Flecainide  prescribed Oct 2024 >> never started Post ablation CP, SOB discussed above with extensive w/u  Studies Reviewed: Jade [name]    EKG done today and reviewed by myself SR 86bpm, low voltage, unchanged  06/04/23; c.MRI IMPRESSION: 1. Normal LV size and function LVEF 54% 2. No delayed gadolinium enhancement in myocardium or pericardium No pericardial effusion 3.  Normal RV size and function RVEF 54% 4.  Mild LAE 5.  Trivial MR 6.  Estimated cardiac output 4.5 L/min 7.  Normal T1/T2 with nonspecific mildly dilated ECV 33%  05/28/23: Chest CT IMPRESSION: 1. No evidence of pulmonary embolism. 2. Mild mosaic attenuation of the lungs, which can be seen in the setting of small airways disease. 3. Prominent left subpectoral lymph node measuring 10 mm short axis, nonspecific. Correlation with recent mammogram is recommended. 4. Aortic atherosclerosis (ICD10-I70.0).  Echo 04/27/23:  1. Left ventricular ejection fraction, by estimation, is 55 to 60%. The  left ventricle has normal function. The left ventricle has no regional  wall motion abnormalities.   2. Small iatrogenic ASD post ablation, flow all left to right.   3. There is no evidence of cardiac tamponade.   4. The inferior vena cava is normal in size with greater than 50%  respiratory variability, suggesting right atrial pressure of 3 mmHg.    PVI ablation 04/20/23  (PFA) CONCLUSIONS: 1. Successful PVI 2. Successful ablation/isolation of the posterior wall 3. Intracardiac echo reveals normal LV size and function, trivial pericardial effusion, normal PV anatomy. 4. No early apparent complications. 5. Resume Eliquis  in 2 hours.   01/29/23: Coronary CT FINDINGS: Non-cardiac: See separate report from [facility]. Normal caliber ascending aorta. Pulmonary veins drain normally to the left atrium. Calcium Score: 0 Agatston units. Coronary Arteries: Right dominant with no anomalies LM: No plaque or stenosis. LAD system:  No plaque or stenosis. Circumflex system: No plaque or stenosis. RCA system:  No plaque or stenosis.   IMPRESSION: 1. Coronary artery calcium score 0 Agatston units. This suggests low risk for future cardiac events. 2.  No significant coronary artery disease noted.  Risk Assessment/Calculations:    Physical Exam:   VS:  There were no vitals taken for this visit.   Wt Readings from Last 3 Encounters:  08/27/23 221 lb 6.4 oz (100.4 kg)  07/15/23 225 lb (102.1 kg)  06/04/23 221 lb 3.2 oz (100.3 kg)    GEN: Well nourished, well developed in no acute distress NECK: No JVD; No carotid bruits CARDIAC: RRR, no murmurs, rubs, gallops RESPIRATORY: CTA b/l without rales, wheezing or rhonchi  ABDOMEN: Soft, non-tender, non-distended  EXTREMITIES: No edema; No deformity   ASSESSMENT AND PLAN: .    paroxysmal AFib CHA2DS2Vasc is one (for aortic atherosclerosis seen on CT) Post ablation pericarditis  Discussed symptom, we could monitor her, decided to hold off if becomes recurrent we can plan ambulatory monitoring  2. Chest tightness/symptom Prior cardiac testing is very reassuring with no coronary ca Preserved LVEF D/w [name], very confident not a cardiac etiology Would further d/w her PMD    Dispo: back in 4 mo,  sooner if needed  Signed, Debbie Fails, PA-C

## 2023-09-17 ENCOUNTER — Encounter: Payer: Self-pay | Admitting: Physician Assistant

## 2023-09-17 ENCOUNTER — Ambulatory Visit: Attending: Physician Assistant | Admitting: Physician Assistant

## 2023-09-17 VITALS — BP 96/64 | Ht 66.0 in | Wt 222.5 lb

## 2023-09-17 DIAGNOSIS — R0789 Other chest pain: Secondary | ICD-10-CM | POA: Diagnosis not present

## 2023-09-17 DIAGNOSIS — I480 Paroxysmal atrial fibrillation: Secondary | ICD-10-CM | POA: Diagnosis not present

## 2023-09-17 NOTE — Patient Instructions (Signed)
 Medication Instructions:    *If you need a refill on your cardiac medications before your next appointment, please call your pharmacy*  Lab Work: NONE ORDERED  TODAY    If you have labs (blood work) drawn today and your tests are completely normal, you will receive your results only by: MyChart Message (if you have MyChart) OR A paper copy in the mail If you have any lab test that is abnormal or we need to change your treatment, we will call you to review the results.  Testing/Procedures: .no    Follow-Up: At [facility], you and your health needs are our priority.  As part of our continuing mission to provide you with exceptional heart care, our providers are all part of one team.  This team includes your primary Cardiologist (physician) and Advanced Practice Providers or APPs (Physician Assistants and Nurse Practitioners) who all work together to provide you with the care you need, when you need it.  Your next appointment:    4 month(s) ( CONTACT  CASSIE HALL/ ANGELINE HAMMER FOR EP SCHEDULING ISSUES )    Provider:    You may see Ardeen Kohler, MD or one of the following Advanced Practice Providers on your designated Care Team:   Mertha Abrahams, [place]  We recommend signing up for the patient portal called "MyChart".  Sign up information is provided on this After Visit Summary.  MyChart is used to connect with patients for Virtual Visits (Telemedicine).  Patients are able to view lab/test results, encounter notes, upcoming appointments, etc.  Non-urgent messages can be sent to your provider as well.   To learn more about what you can do with MyChart, go to [email or web address].   Other Instructions

## 2023-09-26 IMAGING — MG MM DIGITAL SCREENING BILAT W/ TOMO AND CAD
8 series · 8 of 24 positions shown · non-contrast
Comparison: Previous exam(s).

CLINICAL DATA: Screening.

EXAM:
DIGITAL SCREENING BILATERAL MAMMOGRAM WITH TOMOSYNTHESIS AND CAD
TECHNIQUE: Bilateral screening digital craniocaudal and mediolateral oblique
mammograms were obtained. Bilateral screening digital breast
tomosynthesis was performed. The images were evaluated with
computer-aided detection.

[L MLO synth-2D]
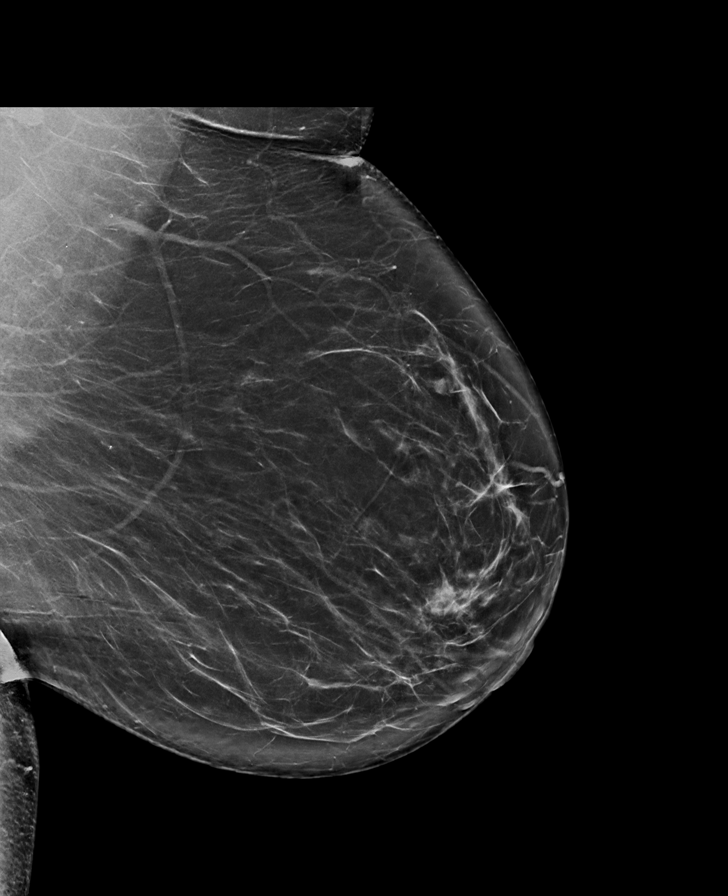

[R MLO synth-2D]
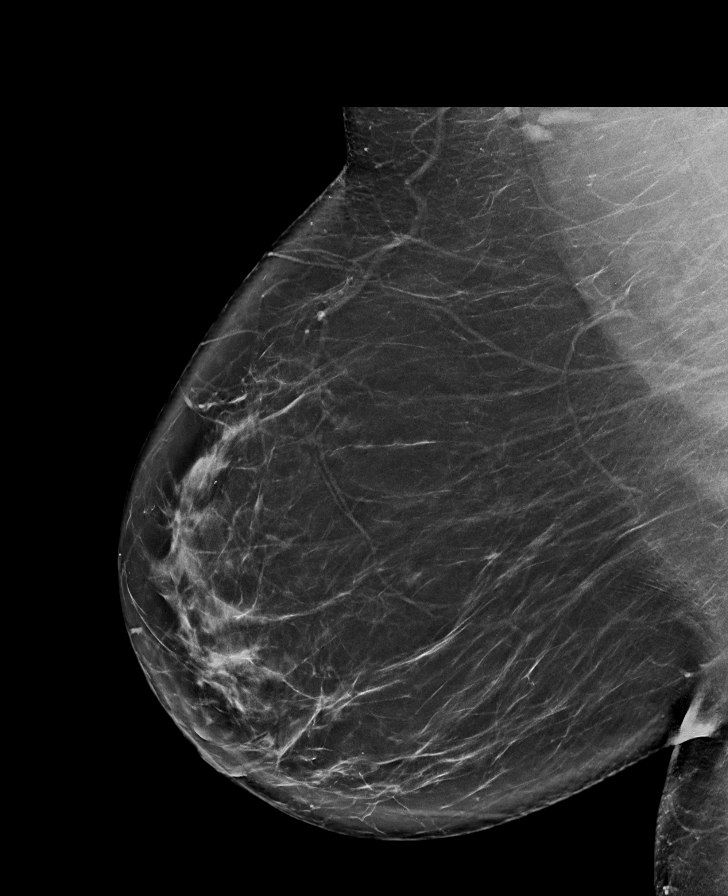

[L CC synth-2D]
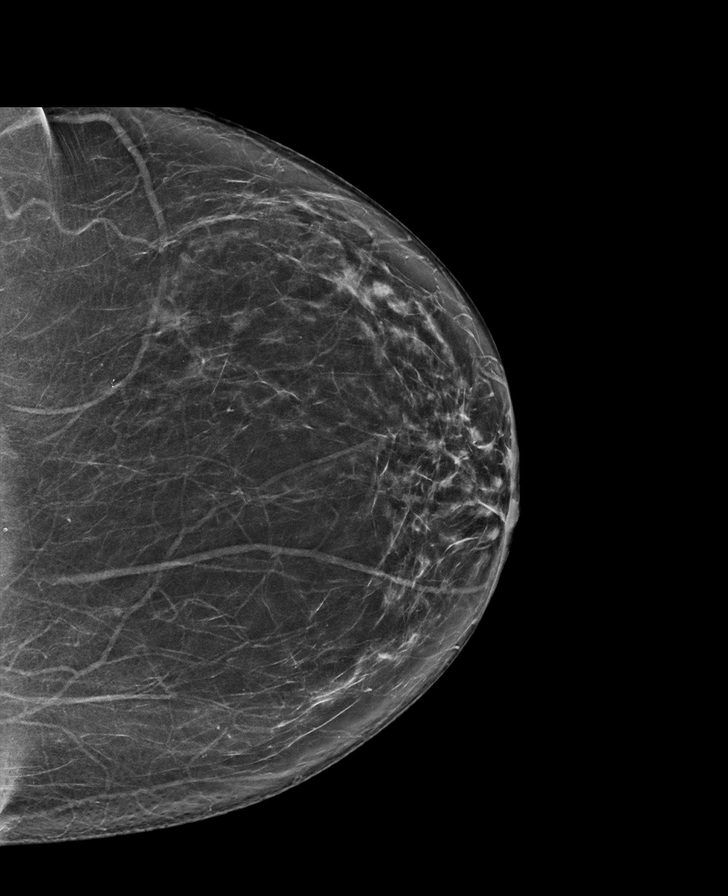

[R CC synth-2D]
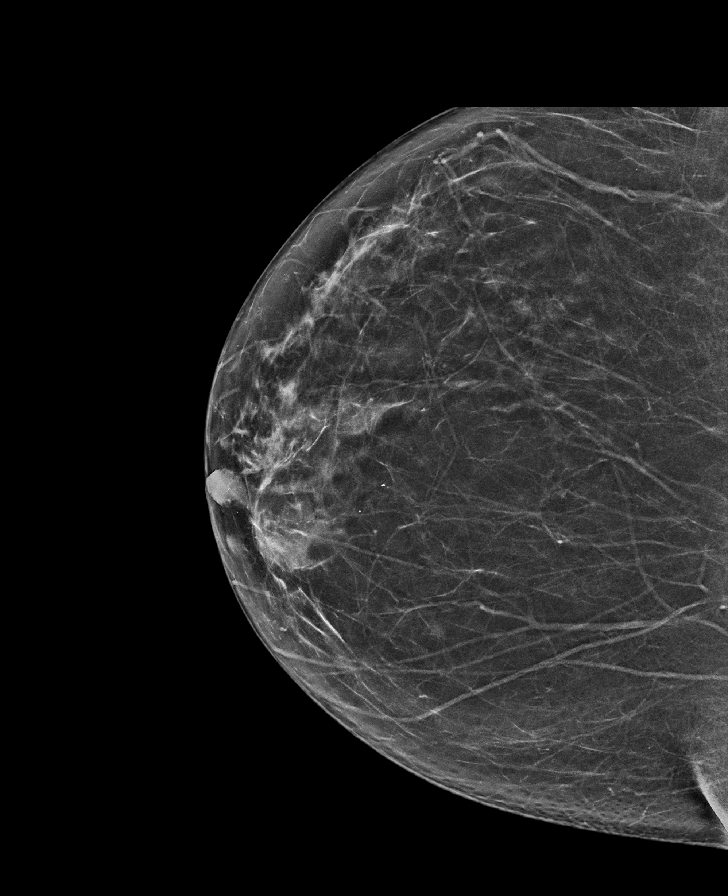

[L MLO tomo · tomo slice 47/92.0]
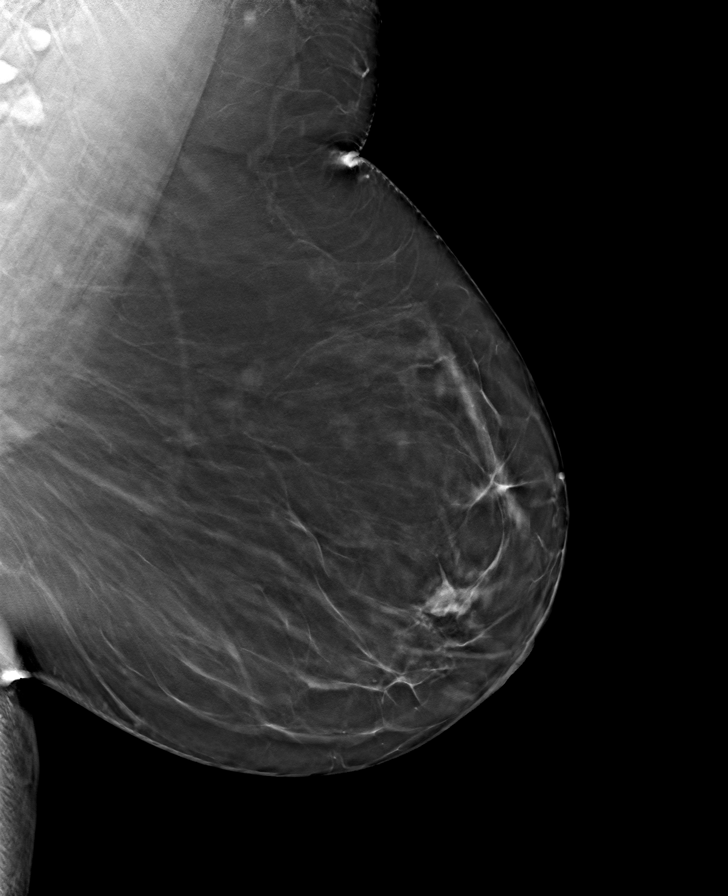

[R CC tomo · tomo slice 37/73.0]
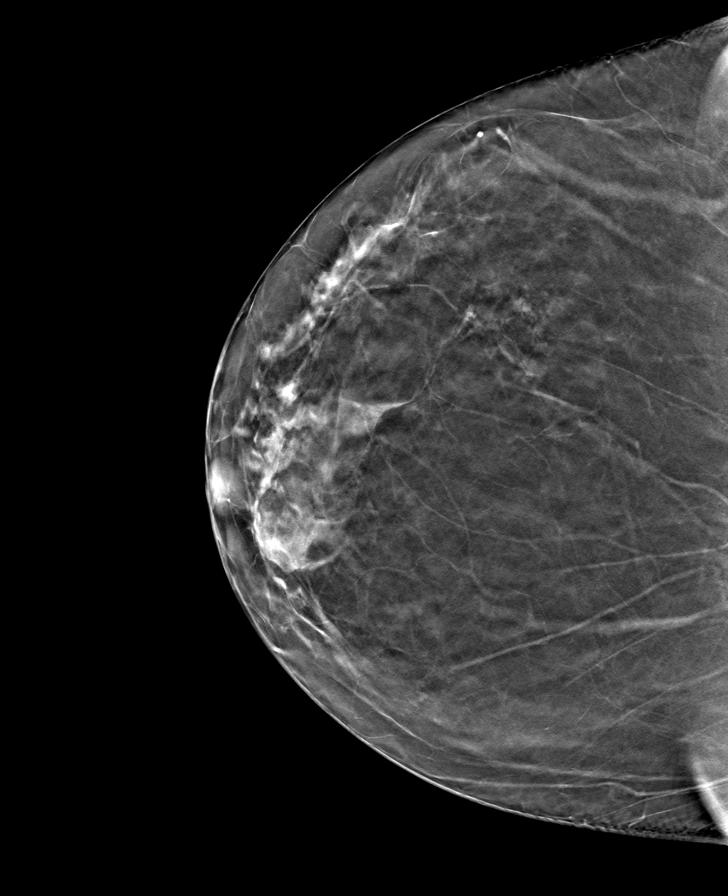

[L CC tomo · tomo slice 40/79.0]
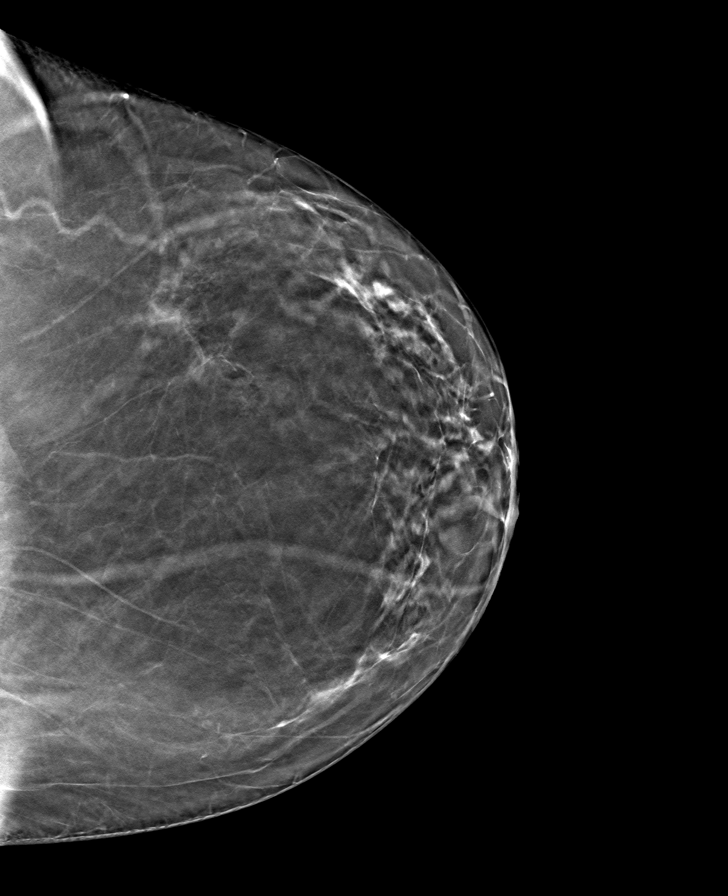

[R MLO tomo · tomo slice 47/93.0]
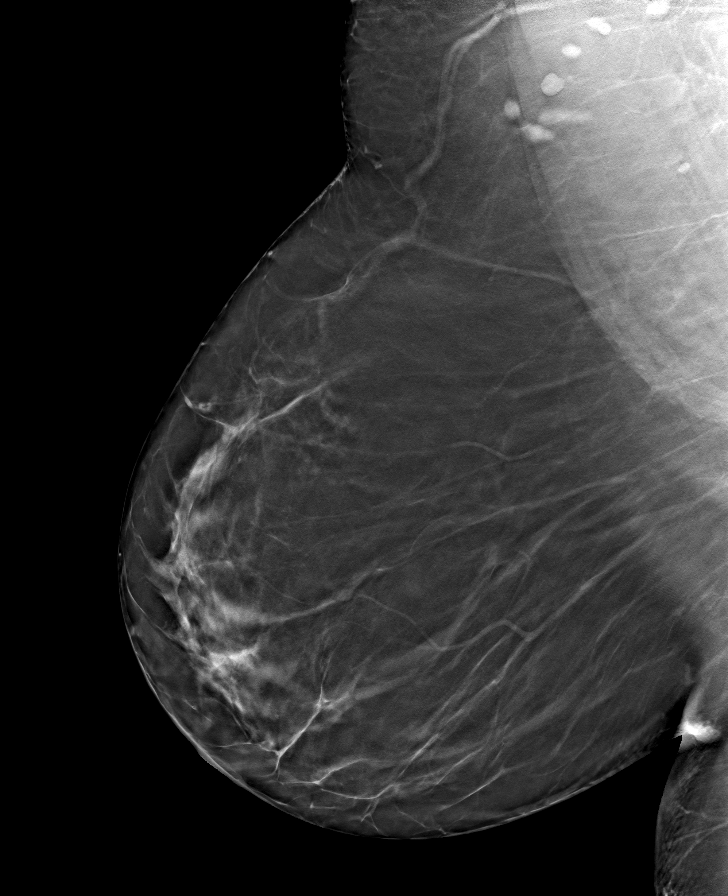

[8 of 24 positions shown; findings below may reference images not displayed]

ACR Breast Density Category b: There are scattered areas of
fibroglandular density.
FINDINGS: There are no findings suspicious for malignancy.
IMPRESSION: No mammographic evidence of malignancy. A result letter of this
screening mammogram will be mailed directly to the patient.

RECOMMENDATION:
Screening mammogram in one year. (Code:51-O-LD2)

BI-RADS CATEGORY  1: Negative.

## 2023-10-15 ENCOUNTER — Ambulatory Visit (INDEPENDENT_AMBULATORY_CARE_PROVIDER_SITE_OTHER): Payer: BC Managed Care – PPO | Admitting: Family Medicine

## 2023-10-15 ENCOUNTER — Encounter: Payer: Self-pay | Admitting: Family Medicine

## 2023-10-15 VITALS — BP 100/64 | HR 89 | Temp 98.2°F | Ht 66.0 in | Wt 217.8 lb

## 2023-10-15 DIAGNOSIS — I480 Paroxysmal atrial fibrillation: Secondary | ICD-10-CM | POA: Diagnosis not present

## 2023-10-15 DIAGNOSIS — K760 Fatty (change of) liver, not elsewhere classified: Secondary | ICD-10-CM | POA: Diagnosis not present

## 2023-10-15 DIAGNOSIS — B009 Herpesviral infection, unspecified: Secondary | ICD-10-CM | POA: Diagnosis not present

## 2023-10-15 DIAGNOSIS — Z13220 Encounter for screening for lipoid disorders: Secondary | ICD-10-CM | POA: Diagnosis not present

## 2023-10-15 DIAGNOSIS — Z0000 Encounter for general adult medical examination without abnormal findings: Secondary | ICD-10-CM | POA: Diagnosis not present

## 2023-10-15 DIAGNOSIS — E2839 Other primary ovarian failure: Secondary | ICD-10-CM

## 2023-10-15 DIAGNOSIS — Z1211 Encounter for screening for malignant neoplasm of colon: Secondary | ICD-10-CM

## 2023-10-15 DIAGNOSIS — Z23 Encounter for immunization: Secondary | ICD-10-CM

## 2023-10-15 DIAGNOSIS — J4520 Mild intermittent asthma, uncomplicated: Secondary | ICD-10-CM

## 2023-10-15 DIAGNOSIS — E538 Deficiency of other specified B group vitamins: Secondary | ICD-10-CM

## 2023-10-15 DIAGNOSIS — Z1231 Encounter for screening mammogram for malignant neoplasm of breast: Secondary | ICD-10-CM

## 2023-10-15 DIAGNOSIS — E559 Vitamin D deficiency, unspecified: Secondary | ICD-10-CM | POA: Diagnosis not present

## 2023-10-15 DIAGNOSIS — Z131 Encounter for screening for diabetes mellitus: Secondary | ICD-10-CM | POA: Diagnosis not present

## 2023-10-15 LAB — COMPREHENSIVE METABOLIC PANEL WITH GFR
ALT: 13 U/L (ref 0–35)
AST: 17 U/L (ref 0–37)
Albumin: 4.3 g/dL (ref 3.5–5.2)
Alkaline Phosphatase: 61 U/L (ref 39–117)
BUN: 13 mg/dL (ref 6–23)
CO2: 26 meq/L (ref 19–32)
Calcium: 9.5 mg/dL (ref 8.4–10.5)
Chloride: 104 meq/L (ref 96–112)
Creatinine, Ser: 0.6 mg/dL (ref 0.40–1.20)
GFR: 97.23 mL/min (ref >60.00)
Glucose, Bld: 105 mg/dL — ABNORMAL HIGH (ref 70–99)
Potassium: 4.1 meq/L (ref 3.5–5.1)
Sodium: 139 meq/L (ref 135–145)
Total Bilirubin: 0.6 mg/dL (ref 0.2–1.2)
Total Protein: 7 g/dL (ref 6.0–8.3)

## 2023-10-15 LAB — CBC
HCT: 40.8 % (ref 36.0–46.0)
Hemoglobin: 13.4 g/dL (ref 12.0–15.0)
MCHC: 32.8 g/dL (ref 30.0–36.0)
MCV: 86.3 fl (ref 78.0–100.0)
Platelets: 235 10*3/uL (ref 150.0–400.0)
RBC: 4.73 Mil/uL (ref 3.87–5.11)
RDW: 13.5 % (ref 11.5–15.5)
WBC: 6.3 10*3/uL (ref 4.0–10.5)

## 2023-10-15 LAB — VITAMIN D 25 HYDROXY (VIT D DEFICIENCY, FRACTURES): VITD: 24.25 ng/mL — ABNORMAL LOW (ref 30.00–100.00)

## 2023-10-15 LAB — LIPID PANEL
Cholesterol: 210 mg/dL — ABNORMAL HIGH (ref 0–200)
HDL: 61.2 mg/dL (ref >39.00)
LDL Cholesterol: 127 mg/dL — ABNORMAL HIGH (ref 0–99)
NonHDL: 148.78
Total CHOL/HDL Ratio: 3
Triglycerides: 111 mg/dL (ref 0.0–149.0)
VLDL: 22.2 mg/dL (ref 0.0–40.0)

## 2023-10-15 LAB — B12 AND FOLATE PANEL
Folate: 11.7 ng/mL (ref >5.9)
Vitamin B-12: 357 pg/mL (ref 211–911)

## 2023-10-15 LAB — TSH: TSH: 2.14 u[IU]/mL (ref 0.35–5.50)

## 2023-10-15 LAB — HEMOGLOBIN A1C: Hgb A1c MFr Bld: 5.9 % (ref 4.6–6.5)

## 2023-10-15 MED ORDER — VALACYCLOVIR HCL 500 MG PO TABS: 500.0000 mg | ORAL_TABLET | Freq: Every day | ORAL | 3 refills | Status: AC

## 2023-10-15 MED ORDER — MONTELUKAST SODIUM 10 MG PO TABS: 10.0000 mg | ORAL_TABLET | Freq: Every day | ORAL | 3 refills | Status: AC

## 2023-10-15 NOTE — Progress Notes (Signed)
 Patient ID: Jade [name], female  DOB: 02-Jul-1960, 63 y.o.   MRN: [id number] Patient Care Team    Relationship Specialty Notifications Start End  Mariel Shope, DO PCP - General Family Medicine  01/27/23   O'Neal, Cathay Clonts, MD PCP - Cardiology Cardiology  01/04/23   Ardeen Kohler, MD PCP - Electrophysiology Cardiology  02/01/23   Tobin Forts, MD Consulting Physician Gastroenterology  05/14/17   Gigi Kyle, OD Referring Physician Optometry  05/14/17   Mort Ards, MD Consulting Physician Allergy  05/14/17     Chief Complaint  Patient presents with   Annual Exam    Pt is fasting.     Subjective: Jade [name] is a 63 y.o.  Female  present for CPE  All past medical history, surgical history, allergies, family history, immunizations, medications and social history were updated in the electronic medical record today. All recent labs, ED visits and hospitalizations within the last year were reviewed.  Health maintenance:  Colonoscopy: completed 11/15/2020, by Dr. Elvin Hammer, 10 yr follow up Mammogram: completed: 08/20/2023- [facility] Cervical cancer screening: History of hysterectomy>pap not indicated Immunizations: tdap UTD 08/2020, Influenza (encouraged yearly),shingrix completed. PNA20. Infectious disease screening: HIV and hep c completed  DEXA: ordered today [facility] Assistive device: none Oxygen VHQ:IONG Patient has a Dental home. Hospitalizations/ED visits:reviewed   Patient reports she went to her cardiologist about having occasional winded feelings, especially with activity.  He did not feel this was cardiac related, and felt possibly related to deconditioning.  He is prescribed inhalers and follows with allergy and asthma for her asthma symptoms.  Will uses her inhalers as needed and currently is not taking any antihistamines.     10/15/2023    8:11 AM 06/04/2023    1:58 PM 02/08/2023    9:42 AM 10/09/2022    2:44 PM 09/19/2021    8:09 AM  Depression screen  PHQ 2/9  Decreased Interest 0 0 1 0 0  Down, Depressed, Hopeless 0 0 0 0 0  PHQ - 2 Score 0 0 1 0 0  Altered sleeping 1 0     Tired, decreased energy 1 0     Change in appetite 0 0     Feeling bad or failure about yourself  0 0     Trouble concentrating 0 0     Moving slowly or fidgety/restless 0 0     Suicidal thoughts 0 0     PHQ-9 Score 2 0     Difficult doing work/chores Not difficult at all Not difficult at all         10/15/2023    8:11 AM 06/04/2023    1:58 PM 05/14/2017    9:19 AM  GAD 7 : Generalized Anxiety Score  Nervous, Anxious, on Edge 0 0 0  Control/stop worrying 0 0 0  Worry too much - different things 0 0 0  Trouble relaxing 0 0 0  Restless 0 0 0  Easily annoyed or irritable 0 0 0  Afraid - awful might happen 0 0 0  Total GAD 7 Score 0 0 0  Anxiety Difficulty Not difficult at all Not difficult at all     Immunization History  Administered Date(s) Administered   Hepb-cpg 09/19/2021, 10/24/2021   Influenza,inj,Quad PF,6+ Mos 03/31/2019   Influenza-Unspecified 01/25/2017, 01/25/2021, 01/09/2023   Moderna Sars-Covid-2 Vaccination 04/18/2019, 05/12/2019   PNEUMOCOCCAL CONJUGATE-20 10/15/2023   Tdap 09/13/2020   Zoster Recombinant(Shingrix) 09/13/2020, 12/20/2020   Past Medical History:  Diagnosis Date   Anxiety    Asthma    uses inhaler prn   Chest pain    Colon polyps    Diarrhea    with constipation while taking diet pills (Alli)   History of rectal bleeding     2 times in past   HSV-2 (herpes simplex virus 2) infection    Palpitations    in past/due to Advil  pm   Vitamin D  deficiency    Allergies  Allergen Reactions   Ciprofloxacin Anaphylaxis   Eliquis  [Apixaban ] Shortness Of Breath    SOB/heavy chest   Sulfa Antibiotics     hives   Past Surgical History:  Procedure Laterality Date   ABDOMINAL HYSTERECTOMY  1998   fibroids. partial   ATRIAL FIBRILLATION ABLATION N/A 04/20/2023   Procedure: ATRIAL FIBRILLATION ABLATION;  Surgeon:  Ardeen Kohler, MD;  Location: [facility] INVASIVE CV LAB;  Service: Cardiovascular;  Laterality: N/A;   CESAREAN SECTION     1 time   COLONOSCOPY W/ POLYPECTOMY  2017   RESECTOSCOPIC MYOMECTOMY     Family History  Problem Relation Age of Onset   Asthma Mother    Diabetes Father    Hypertension Father    Heart disease Father    Prostate cancer Father    Atrial fibrillation Sister    Thyroid  disease Sister    Atrial fibrillation Brother    Diabetes Paternal Uncle    Lung cancer Paternal Grandfather    Colon cancer Neg Hx    Colon polyps Neg Hx    Rectal cancer Neg Hx    Stomach cancer Neg Hx    Esophageal cancer Neg Hx    Social History   Social History Narrative   Married. One child.   College-educated, works as a Armed forces operational officer.   Smoke alarm in the home. Wears her seatbelt.   Feels safe in her relationship.    Allergies as of 10/15/2023       Reactions   Ciprofloxacin Anaphylaxis   Eliquis  [apixaban ] Shortness Of Breath   SOB/heavy chest   Sulfa Antibiotics    hives        Medication List        Accurate as of October 15, 2023 12:59 PM. If you have any questions, ask your nurse or doctor.          STOP taking these medications    colchicine  0.6 MG tablet Stopped by: Napolean Backbone   furosemide  20 MG tablet Commonly known as: LASIX  Stopped by: Napolean Backbone   LORazepam  0.5 MG tablet Commonly known as: ATIVAN  Stopped by: Dane Bloch   metoprolol  succinate 25 MG 24 hr tablet Commonly known as: TOPROL -XL Stopped by: Imoni Kohen   pantoprazole  40 MG tablet Commonly known as: PROTONIX  Stopped by: Napolean Backbone   Xarelto  20 MG Tabs tablet Generic drug: rivaroxaban  Stopped by: Napolean Backbone       TAKE these medications    Airsupra 90-80 MCG/ACT Aero Generic drug: Albuterol-Budesonide SMARTSIG:2 Puff(s) Via Inhaler Every 2 Hours PRN   albuterol 108 (90 Base) MCG/ACT inhaler Commonly known as: VENTOLIN HFA Inhale into the lungs every 6 (six) hours  as needed for wheezing or shortness of breath.   diclofenac  75 MG EC tablet Commonly known as: VOLTAREN  Take 1 tablet (75 mg total) by mouth 2 (two) times daily. What changed:  when to take this reasons to take this   ipratropium-albuterol 0.5-2.5 (3) MG/3ML Soln Commonly known as: DUONEB SMARTSIG:1 Vial(s) Every 4-6 Hours PRN  montelukast 10 MG tablet Commonly known as: SINGULAIR Take 1 tablet (10 mg total) by mouth at bedtime. What changed: See the new instructions. Changed by: Napolean Backbone   Trelegy Ellipta 200-62.5-25 MCG/ACT Aepb Generic drug: Fluticasone -Umeclidin-Vilant Inhale 1 puff into the lungs daily.   valACYclovir  500 MG tablet Commonly known as: VALTREX  Take 1 tablet (500 mg total) by mouth daily. What changed:  when to take this reasons to take this        All past medical history, surgical history, allergies, family history, immunizations andmedications were updated in the EMR today and reviewed under the history and medication portions of their EMR.       ROS 14 pt review of systems performed and negative (unless mentioned in an HPI)  Objective: BP 100/64   Pulse 89   Temp 98.2 F (36.8 C)   Ht 5' 6 (1.676 m)   Wt 217 lb 12.8 oz (98.8 kg)   SpO2 97%   BMI 35.15 kg/m  Physical Exam Vitals and nursing note reviewed.  Constitutional:      General: She is not in acute distress.    Appearance: Normal appearance. She is obese. She is not ill-appearing or toxic-appearing.  HENT:     Head: Normocephalic and atraumatic.     Right Ear: Tympanic membrane, ear canal and external ear normal. There is no impacted cerumen.     Left Ear: Tympanic membrane, ear canal and external ear normal. There is no impacted cerumen.     Nose: No congestion or rhinorrhea.     Mouth/Throat:     Mouth: Mucous membranes are moist.     Pharynx: Oropharynx is clear. No oropharyngeal exudate or posterior oropharyngeal erythema.   Eyes:     General: No scleral icterus.        Right eye: No discharge.        Left eye: No discharge.     Extraocular Movements: Extraocular movements intact.     Conjunctiva/sclera: Conjunctivae normal.     Pupils: Pupils are equal, round, and reactive to light.    Cardiovascular:     Rate and Rhythm: Normal rate and regular rhythm.     Pulses: Normal pulses.     Heart sounds: Normal heart sounds. No murmur heard.    No friction rub. No gallop.  Pulmonary:     Effort: Pulmonary effort is normal. No respiratory distress.     Breath sounds: Normal breath sounds. No stridor. No wheezing, rhonchi or rales.  Chest:     Chest wall: No tenderness.  Abdominal:     General: Abdomen is flat. Bowel sounds are normal. There is no distension.     Palpations: Abdomen is soft. There is no mass.     Tenderness: There is no abdominal tenderness. There is no right CVA tenderness, left CVA tenderness, guarding or rebound.     Hernia: No hernia is present.   Musculoskeletal:        General: No swelling, tenderness or deformity. Normal range of motion.     Cervical back: Normal range of motion and neck supple. No rigidity or tenderness.     Right lower leg: No edema.     Left lower leg: No edema.  Lymphadenopathy:     Cervical: No cervical adenopathy.   Skin:    General: Skin is warm and dry.     Coloration: Skin is not jaundiced or pale.     Findings: No bruising, erythema, lesion or rash.  Neurological:     General: No focal deficit present.     Mental Status: She is alert and oriented to person, place, and time. Mental status is at baseline.     Cranial Nerves: No cranial nerve deficit.     Sensory: No sensory deficit.     Motor: No weakness.     Coordination: Coordination normal.     Gait: Gait normal.     Deep Tendon Reflexes: Reflexes normal.   Psychiatric:        Mood and Affect: Mood normal.        Behavior: Behavior normal.        Thought Content: Thought content normal.        Judgment: Judgment normal.        No results found.  Assessment/plan: Jade [name] is a 63 y.o. female present for CPE  HSV-2 (herpes simplex virus 2) infection Stable Continue valtrex .   Routine general medical examination at a health care facility Colonoscopy: completed 11/15/2020, by Dr. Elvin Hammer, 10 yr follow up Mammogram: completed: 08/20/2023- [facility] Cervical cancer screening: History of hysterectomy>pap not indicated Immunizations: tdap UTD 08/2020, Influenza (encouraged yearly),shingrix completed.  Infectious disease screening: HIV and hep c completed  DEXA:order- [facility] Patient was encouraged to exercise greater than 150 minutes a week. Patient was encouraged to choose a diet filled with fresh fruits and vegetables, and lean meats. AVS provided to patient today for education/recommendation on gender specific health and safety maintenance. - CBC - Comprehensive metabolic panel with GFR - TSH  Hepatic steatosis Lipid screening - Lipid panel Diabetes mellitus screening - Hemoglobin A1c  Paroxysmal atrial fibrillation (HCC)-status post ablation Managed by cardiology  Need for vaccination for pneumococcus - Pneumococcal conjugate vaccine 20-valent  Vitamin D  deficiency - Vitamin D  (25 hydroxy) B12 deficiency - B12 and Folate Panel Estrogen deficiency - DG Bone Density; Future  Asthma Continue to follow with allergy and asthma Encouraged her to use her daily inhaler Restart antihistamine of choice before bed Restart Singulair-refilled for her today Discussed the winded feelings could be secondary to mild allergy symptoms resurfacing.  Staying on the antihistamine and Singulair would be recommended indefinitely.  Return in about 1 year (around 10/15/2024).   Orders Placed This Encounter  Procedures   DG Bone Density   Pneumococcal conjugate vaccine 20-valent   CBC   Comprehensive metabolic panel with GFR   Hemoglobin A1c   Lipid panel   TSH   Vitamin D  (25 hydroxy)   B12 and  Folate Panel   Meds ordered this encounter  Medications   valACYclovir  (VALTREX ) 500 MG tablet    Sig: Take 1 tablet (500 mg total) by mouth daily.    Dispense:  90 tablet    Refill:  3   montelukast (SINGULAIR) 10 MG tablet    Sig: Take 1 tablet (10 mg total) by mouth at bedtime.    Dispense:  90 tablet    Refill:  3   Referral Orders  No referral(s) requested today     Electronically signed by: Napolean Backbone, DO [facility]

## 2023-10-15 NOTE — Patient Instructions (Addendum)
 Return in about 1 year (around 10/15/2024).        Great to see you today.  I have refilled the medication(s) we provide.   If labs were collected or images ordered, we will inform you of  results once we have received them and reviewed. We will contact you either by echart message, or telephone call.  Please give ample time to the testing facility, and our office to run,  receive and review results. Please do not call inquiring of results, even if you can see them in your chart. We will contact you as soon as we are able. If it has been over 1 week since the test was completed, and you have not yet heard from us , then please call us .    - echart message- for normal results that have been seen by the patient already.   - telephone call: abnormal results or if patient has not viewed results in their echart.  If a referral to a specialist was entered for you, please call us  in 2 weeks if you have not heard from the specialist office to schedule.

## 2023-10-18 ENCOUNTER — Ambulatory Visit: Payer: Self-pay | Admitting: Family Medicine

## 2023-10-21 ENCOUNTER — Ambulatory Visit: Payer: BC Managed Care – PPO | Admitting: Cardiovascular Disease

## 2023-10-22 DIAGNOSIS — Z6835 Body mass index (BMI) 35.0-35.9, adult: Secondary | ICD-10-CM | POA: Diagnosis not present

## 2023-10-22 DIAGNOSIS — Z01419 Encounter for gynecological examination (general) (routine) without abnormal findings: Secondary | ICD-10-CM | POA: Diagnosis not present

## 2023-10-22 DIAGNOSIS — Z1151 Encounter for screening for human papillomavirus (HPV): Secondary | ICD-10-CM | POA: Diagnosis not present

## 2023-11-05 ENCOUNTER — Ambulatory Visit: Admitting: Podiatry

## 2023-11-05 DIAGNOSIS — M62461 Contracture of muscle, right lower leg: Secondary | ICD-10-CM | POA: Diagnosis not present

## 2023-11-05 DIAGNOSIS — M62462 Contracture of muscle, left lower leg: Secondary | ICD-10-CM | POA: Diagnosis not present

## 2023-11-05 DIAGNOSIS — M722 Plantar fascial fibromatosis: Secondary | ICD-10-CM

## 2023-11-05 NOTE — Progress Notes (Signed)
 Subjective:  Patient ID: Jade [name], female    DOB: [birth year],  MRN: [id number]  Chief Complaint  Patient presents with   Foot Pain    Pt stated that she is having  pain in both her feet she stated that it is pretty constant throughout the day she stated that it wakes her up at night     63 y.o. female presents with the above complaint. Patient presents with bilateral heel pain that just came out of nowhere has been hurting her for quite some time has been going on constantly throughout the day.  It wakes her up at night she went to get it evaluated pain scale 7 out of 10 dull aching nature she has not seen anyone else prior to seeing me denies any other acute complaints.  Denies any other acute issues   Review of Systems: Negative except as noted in the HPI. Denies N/V/F/Ch.  Past Medical History:  Diagnosis Date   Anxiety    Asthma    uses inhaler prn   Chest pain    Colon polyps    Diarrhea    with constipation while taking diet pills (Alli)   History of rectal bleeding     2 times in past   HSV-2 (herpes simplex virus 2) infection    Palpitations    in past/due to Advil  pm   Vitamin D  deficiency     Current Outpatient Medications:    AIRSUPRA 90-80 MCG/ACT AERO, SMARTSIG:2 Puff(s) Via Inhaler Every 2 Hours PRN, Disp: , Rfl:    albuterol (VENTOLIN HFA) 108 (90 Base) MCG/ACT inhaler, Inhale into the lungs every 6 (six) hours as needed for wheezing or shortness of breath., Disp: , Rfl:    diclofenac  (VOLTAREN ) 75 MG EC tablet, Take 1 tablet (75 mg total) by mouth 2 (two) times daily. (Patient taking differently: Take 75 mg by mouth as needed.), Disp: 180 tablet, Rfl: 1   ipratropium-albuterol (DUONEB) 0.5-2.5 (3) MG/3ML SOLN, SMARTSIG:1 Vial(s) Every 4-6 Hours PRN, Disp: , Rfl:    montelukast  (SINGULAIR ) 10 MG tablet, Take 1 tablet (10 mg total) by mouth at bedtime., Disp: 90 tablet, Rfl: 3   TRELEGY ELLIPTA 200-62.5-25 MCG/ACT AEPB, Inhale 1 puff into the lungs daily.,  Disp: , Rfl:    valACYclovir  (VALTREX ) 500 MG tablet, Take 1 tablet (500 mg total) by mouth daily., Disp: 90 tablet, Rfl: 3  Social History   Tobacco Use  Smoking Status Never  Smokeless Tobacco Never  Tobacco Comments   Never smoked 04/30/23    Allergies  Allergen Reactions   Ciprofloxacin Anaphylaxis   Eliquis  [Apixaban ] Shortness Of Breath    SOB/heavy chest   Sulfa Antibiotics     hives   Objective:  There were no vitals filed for this visit. There is no height or weight on file to calculate BMI. Constitutional Well developed. Well nourished.  Vascular Dorsalis pedis pulses palpable bilaterally. Posterior tibial pulses palpable bilaterally. Capillary refill normal to all digits.  No cyanosis or clubbing noted. Pedal hair growth normal.  Neurologic Normal speech. Oriented to person, place, and time. Epicritic sensation to light touch grossly present bilaterally.  Dermatologic Nails well groomed and normal in appearance. No open wounds. No skin lesions.  Orthopedic: Normal joint ROM without pain or crepitus bilaterally. No visible deformities. Tender to palpation at the calcaneal tuber bilaterally. No pain with calcaneal squeeze bilaterally. Ankle ROM diminished range of motion bilaterally. Silfverskiold Test: positive bilaterally.   Radiographs: None  Assessment:  1. Plantar fasciitis of right foot   2. Plantar fasciitis of left foot   3. Gastrocnemius equinus, bilateral    Plan:  Patient was evaluated and treated and all questions answered.  Plantar Fasciitis, bilaterally with underlying gastrocnemius equinus - XR reviewed as above.  - Educated on icing and stretching. Instructions given.  - Injection delivered to the plantar fascia as below. - DME: Plantar fascial brace dispensed to support the medial longitudinal arch of the foot and offload pressure from the heel and prevent arch collapse during weightbearing - Pharmacologic management:  None  Procedure: Injection Tendon/Ligament Location: Bilateral plantar fascia at the glabrous junction; medial approach. Skin Prep: alcohol Injectate: 0.5 cc 0.5% marcaine plain, 0.5 cc of 1% Lidocaine , 0.5 cc kenalog  10. Disposition: Patient tolerated procedure well. Injection site dressed with a band-aid.  No follow-ups on file.

## 2023-11-18 DIAGNOSIS — D2221 Melanocytic nevi of right ear and external auricular canal: Secondary | ICD-10-CM | POA: Diagnosis not present

## 2023-11-18 DIAGNOSIS — D2372 Other benign neoplasm of skin of left lower limb, including hip: Secondary | ICD-10-CM | POA: Diagnosis not present

## 2023-11-18 DIAGNOSIS — D225 Melanocytic nevi of trunk: Secondary | ICD-10-CM | POA: Diagnosis not present

## 2023-11-18 DIAGNOSIS — Z129 Encounter for screening for malignant neoplasm, site unspecified: Secondary | ICD-10-CM | POA: Diagnosis not present

## 2023-12-02 DIAGNOSIS — H25811 Combined forms of age-related cataract, right eye: Secondary | ICD-10-CM | POA: Diagnosis not present

## 2023-12-02 DIAGNOSIS — Z01818 Encounter for other preprocedural examination: Secondary | ICD-10-CM | POA: Diagnosis not present

## 2023-12-03 ENCOUNTER — Ambulatory Visit: Admitting: Podiatry

## 2023-12-03 DIAGNOSIS — M722 Plantar fascial fibromatosis: Secondary | ICD-10-CM

## 2023-12-03 DIAGNOSIS — Q667 Congenital pes cavus, unspecified foot: Secondary | ICD-10-CM | POA: Diagnosis not present

## 2023-12-03 NOTE — Progress Notes (Signed)
 Subjective:  Patient ID: Jade [name], female    DOB: May 16, 1960,  MRN: [id number]  Chief Complaint  Patient presents with   Plantar Fasciitis    Pt stated that the injection did great for the first few weeks but the pain is starting to come back     63 y.o. female presents with the above complaint.  Patient presents with complaint of bilateral heel pain.  She states that she is doing a lot better the injection helped she is here for another round of injection she would like to discuss orthotics   Review of Systems: Negative except as noted in the HPI. Denies N/V/F/Ch.  Past Medical History:  Diagnosis Date   Anxiety    Asthma    uses inhaler prn   Chest pain    Colon polyps    Diarrhea    with constipation while taking diet pills (Alli)   History of rectal bleeding     2 times in past   HSV-2 (herpes simplex virus 2) infection    Palpitations    in past/due to Advil  pm   Vitamin D  deficiency     Current Outpatient Medications:    AIRSUPRA 90-80 MCG/ACT AERO, SMARTSIG:2 Puff(s) Via Inhaler Every 2 Hours PRN, Disp: , Rfl:    albuterol (VENTOLIN HFA) 108 (90 Base) MCG/ACT inhaler, Inhale into the lungs every 6 (six) hours as needed for wheezing or shortness of breath., Disp: , Rfl:    diclofenac  (VOLTAREN ) 75 MG EC tablet, Take 1 tablet (75 mg total) by mouth 2 (two) times daily. (Patient taking differently: Take 75 mg by mouth as needed.), Disp: 180 tablet, Rfl: 1   ipratropium-albuterol (DUONEB) 0.5-2.5 (3) MG/3ML SOLN, SMARTSIG:1 Vial(s) Every 4-6 Hours PRN, Disp: , Rfl:    montelukast  (SINGULAIR ) 10 MG tablet, Take 1 tablet (10 mg total) by mouth at bedtime., Disp: 90 tablet, Rfl: 3   TRELEGY ELLIPTA 200-62.5-25 MCG/ACT AEPB, Inhale 1 puff into the lungs daily., Disp: , Rfl:    valACYclovir  (VALTREX ) 500 MG tablet, Take 1 tablet (500 mg total) by mouth daily., Disp: 90 tablet, Rfl: 3  Social History   Tobacco Use  Smoking Status Never  Smokeless Tobacco Never   Tobacco Comments   Never smoked 04/30/23    Allergies  Allergen Reactions   Ciprofloxacin Anaphylaxis   Eliquis  [Apixaban ] Shortness Of Breath    SOB/heavy chest   Sulfa Antibiotics     hives   Objective:  There were no vitals filed for this visit. There is no height or weight on file to calculate BMI. Constitutional Well developed. Well nourished.  Vascular Dorsalis pedis pulses palpable bilaterally. Posterior tibial pulses palpable bilaterally. Capillary refill normal to all digits.  No cyanosis or clubbing noted. Pedal hair growth normal.  Neurologic Normal speech. Oriented to person, place, and time. Epicritic sensation to light touch grossly present bilaterally.  Dermatologic Nails well groomed and normal in appearance. No open wounds. No skin lesions.  Orthopedic: Normal joint ROM without pain or crepitus bilaterally. No visible deformities. Tender to palpation at the calcaneal tuber bilaterally. No pain with calcaneal squeeze bilaterally. Ankle ROM diminished range of motion bilaterally. Silfverskiold Test: positive bilaterally.   Radiographs: None  Assessment:   1. Plantar fasciitis of left foot   2. Plantar fasciitis of right foot   3. Pes cavus     Plan:  Patient was evaluated and treated and all questions answered.  Plantar Fasciitis, bilaterally with underlying gastrocnemius equinus - XR reviewed as  above.  - Educated on icing and stretching. Instructions given.  - Second injection delivered to the plantar fascia as below. - DME: Plantar fascial brace dispensed to support the medial longitudinal arch of the foot and offload pressure from the heel and prevent arch collapse during weightbearing - Pharmacologic management: None pes cavus  Pes cavus -I explained to patient the etiology of pes cavus and relationship with Planter fasciitis and various treatment options were discussed.  Given patient foot structure in the setting of Planter fasciitis I  believe patient will benefit from custom-made orthotics to help control the hindfoot motion support the arch of the foot and take the stress away from plantar fascial.  Patient agrees with the plan like to proceed with orthotics -Patient was casted for orthotics   Procedure: Injection Tendon/Ligament Location: Bilateral plantar fascia at the glabrous junction; medial approach. Skin Prep: alcohol Injectate: 0.5 cc 0.5% marcaine plain, 0.5 cc of 1% Lidocaine , 0.5 cc kenalog  10. Disposition: Patient tolerated procedure well. Injection site dressed with a band-aid.  No follow-ups on file.

## 2023-12-24 DIAGNOSIS — J45909 Unspecified asthma, uncomplicated: Secondary | ICD-10-CM | POA: Diagnosis not present

## 2023-12-24 DIAGNOSIS — H25811 Combined forms of age-related cataract, right eye: Secondary | ICD-10-CM | POA: Diagnosis not present

## 2023-12-24 DIAGNOSIS — H268 Other specified cataract: Secondary | ICD-10-CM | POA: Diagnosis not present

## 2023-12-31 ENCOUNTER — Ambulatory Visit: Admitting: Podiatry

## 2023-12-31 DIAGNOSIS — M722 Plantar fascial fibromatosis: Secondary | ICD-10-CM

## 2023-12-31 NOTE — Progress Notes (Signed)
 Subjective:  Patient ID: Jade [name], female    DOB: July 23, 1960,  MRN: [id number]  Chief Complaint  Patient presents with   Plantar Fasciitis    Pt stated that her right foot is doing better she stated that she still is having some discomfort in her left foot she stated she is still having discomfort in her left ankle she said it almost feels like her foot is going inward when she walks     63 y.o. female presents with the above complaint.  Patient presents with complaint of bilateral heel pain.  He states the left side is still very painful denies any other acute complaints.  She wants to discuss next treatment plan.   Review of Systems: Negative except as noted in the HPI. Denies N/V/F/Ch.  Past Medical History:  Diagnosis Date   Anxiety    Asthma    uses inhaler prn   Chest pain    Colon polyps    Diarrhea    with constipation while taking diet pills (Alli)   History of rectal bleeding     2 times in past   HSV-2 (herpes simplex virus 2) infection    Palpitations    in past/due to Advil  pm   Vitamin D  deficiency     Current Outpatient Medications:    AIRSUPRA 90-80 MCG/ACT AERO, SMARTSIG:2 Puff(s) Via Inhaler Every 2 Hours PRN, Disp: , Rfl:    albuterol (VENTOLIN HFA) 108 (90 Base) MCG/ACT inhaler, Inhale into the lungs every 6 (six) hours as needed for wheezing or shortness of breath., Disp: , Rfl:    diclofenac  (VOLTAREN ) 75 MG EC tablet, Take 1 tablet (75 mg total) by mouth 2 (two) times daily. (Patient taking differently: Take 75 mg by mouth as needed.), Disp: 180 tablet, Rfl: 1   ipratropium-albuterol (DUONEB) 0.5-2.5 (3) MG/3ML SOLN, SMARTSIG:1 Vial(s) Every 4-6 Hours PRN, Disp: , Rfl:    montelukast  (SINGULAIR ) 10 MG tablet, Take 1 tablet (10 mg total) by mouth at bedtime., Disp: 90 tablet, Rfl: 3   TRELEGY ELLIPTA 200-62.5-25 MCG/ACT AEPB, Inhale 1 puff into the lungs daily., Disp: , Rfl:    valACYclovir  (VALTREX ) 500 MG tablet, Take 1 tablet (500 mg total) by  mouth daily., Disp: 90 tablet, Rfl: 3  Social History   Tobacco Use  Smoking Status Never  Smokeless Tobacco Never  Tobacco Comments   Never smoked 04/30/23    Allergies  Allergen Reactions   Ciprofloxacin Anaphylaxis   Eliquis  [Apixaban ] Shortness Of Breath    SOB/heavy chest   Sulfa Antibiotics     hives   Objective:  There were no vitals filed for this visit. There is no height or weight on file to calculate BMI. Constitutional Well developed. Well nourished.  Vascular Dorsalis pedis pulses palpable bilaterally. Posterior tibial pulses palpable bilaterally. Capillary refill normal to all digits.  No cyanosis or clubbing noted. Pedal hair growth normal.  Neurologic Normal speech. Oriented to person, place, and time. Epicritic sensation to light touch grossly present bilaterally.  Dermatologic Nails well groomed and normal in appearance. No open wounds. No skin lesions.  Orthopedic: Normal joint ROM without pain or crepitus bilaterally. No visible deformities. Tender to palpation at the calcaneal tuber bilaterally. No pain with calcaneal squeeze bilaterally. Ankle ROM diminished range of motion bilaterally. Silfverskiold Test: positive bilaterally.   Radiographs: None  Assessment:   No diagnosis found.   Plan:  Patient was evaluated and treated and all questions answered.  Plantar Fasciitis, bilaterally with underlying gastrocnemius  equinus - XR reviewed as above.  - Educated on icing and stretching. Instructions given.  - No further injection as they have not helped - DME: Cam boot immobilization - Pharmacologic management: None pes cavus  Pes cavus -I explained to patient the etiology of pes cavus and relationship with Planter fasciitis and various treatment options were discussed.  Given patient foot structure in the setting of Planter fasciitis I believe patient will benefit from custom-made orthotics to help control the hindfoot motion support the  arch of the foot and take the stress away from plantar fascial.  Patient agrees with the plan like to proceed with orthotics - Patient is orthotics were dispensed they are functioning well no acute complaints     No follow-ups on file.

## 2024-01-07 ENCOUNTER — Ambulatory Visit: Admitting: Physician Assistant

## 2024-01-27 ENCOUNTER — Ambulatory Visit: Payer: Self-pay

## 2024-01-27 NOTE — Telephone Encounter (Signed)
 FYI Only or Action Required?: FYI only for provider.  Patient was last seen in primary care on 10/15/2023 by Catherine Fuller A, DO.  Called Nurse Triage reporting Abdominal Pain.  Symptoms began several days ago.  Interventions attempted: OTC medications: Tylenol .  Symptoms are: stable.  Triage Disposition: See Within 3 Days in Office (overriding See Physician Within 24 Hours)  Patient/caregiver understands and will follow disposition?: Yes                             Copied from CRM [id number]. Topic: Clinical - Red Word Triage >> Jan 27, 2024  9:31 AM Jade [name] wrote: [facility] that prompted transfer to Nurse Triage: Patient states she is having severe sharp pains on the right side of her abdomen, and severe cramping. Reason for Disposition  [1] MODERATE pain (e.g., interferes with normal activities) AND [2] pain comes and goes (cramps) AND [3] present > 24 hours  (Exception: Pain with Vomiting or Diarrhea - see that Guideline.)  Answer Assessment - Initial Assessment Questions 1. LOCATION: Where does it hurt?      RUQ 2. RADIATION: Does the pain shoot anywhere else? (e.g., chest, back)     States pain stays localized 3. ONSET: When did the pain begin? (e.g., minutes, hours or days ago)      Sunday afternoon 4. SUDDEN: Gradual or sudden onset?     Sudden 5. PATTERN Does the pain come and go, or is it constant?     Comes and goes- cramping 6. SEVERITY: How bad is the pain?  (e.g., Scale 1-10; mild, moderate, or severe)     States pain has eased up, but she is experiencing lingering sharp pain and cramping, rates pain a 5 8. CAUSE: What do you think is causing the stomach pain? (e.g., gallstones, recent abdominal surgery)     Unsure  9. RELIEVING/AGGRAVATING FACTORS: What makes it better or worse? (e.g., antacids, bending or twisting motion, bowel movement)     Pain is worse after eating 10. OTHER SYMPTOMS: Do you have any other  symptoms? (e.g., back pain, diarrhea, fever, urination pain, vomiting)     Mild nausea, denies vomiting, denies diarrhea, sore throat and fever over the weekend- denies at this time 11. PREGNANCY: Is there any chance you are pregnant? When was your last menstrual period?     N/A    This RN scheduled patient for first available appointment in office.  Protocols used: Abdominal Pain - Female-A-AH

## 2024-01-28 ENCOUNTER — Ambulatory Visit: Admitting: Family Medicine

## 2024-01-28 ENCOUNTER — Encounter: Payer: Self-pay | Admitting: Family Medicine

## 2024-01-28 ENCOUNTER — Ambulatory Visit: Admitting: Podiatry

## 2024-01-28 VITALS — BP 106/72 | HR 83 | Temp 98.3°F | Wt 223.0 lb

## 2024-01-28 DIAGNOSIS — R109 Unspecified abdominal pain: Secondary | ICD-10-CM

## 2024-01-28 DIAGNOSIS — K2900 Acute gastritis without bleeding: Secondary | ICD-10-CM

## 2024-01-28 LAB — COMPREHENSIVE METABOLIC PANEL WITH GFR
ALT: 13 U/L (ref 0–35)
AST: 17 U/L (ref 0–37)
Albumin: 4.2 g/dL (ref 3.5–5.2)
Alkaline Phosphatase: 56 U/L (ref 39–117)
BUN: 15 mg/dL (ref 6–23)
CO2: 27 meq/L (ref 19–32)
Calcium: 9.3 mg/dL (ref 8.4–10.5)
Chloride: 104 meq/L (ref 96–112)
Creatinine, Ser: 0.72 mg/dL (ref 0.40–1.20)
GFR: 90.38 mL/min (ref >60.00)
Glucose, Bld: 109 mg/dL — ABNORMAL HIGH (ref 70–99)
Potassium: 3.9 meq/L (ref 3.5–5.1)
Sodium: 139 meq/L (ref 135–145)
Total Bilirubin: 0.4 mg/dL (ref 0.2–1.2)
Total Protein: 6.8 g/dL (ref 6.0–8.3)

## 2024-01-28 LAB — C-REACTIVE PROTEIN: CRP: <0.5 mg/dL (ref 0.5–20.0)

## 2024-01-28 LAB — CBC WITH DIFFERENTIAL/PLATELET
Basophils Absolute: 0.1 K/uL (ref 0.0–0.1)
Basophils Relative: 1 % (ref 0.0–3.0)
Eosinophils Absolute: 0.1 K/uL (ref 0.0–0.7)
Eosinophils Relative: 1.6 % (ref 0.0–5.0)
HCT: 39.5 % (ref 36.0–46.0)
Hemoglobin: 13.1 g/dL (ref 12.0–15.0)
Lymphocytes Relative: 30.3 % (ref 12.0–46.0)
Lymphs Abs: 2.4 K/uL (ref 0.7–4.0)
MCHC: 33.3 g/dL (ref 30.0–36.0)
MCV: 85.6 fl (ref 78.0–100.0)
Monocytes Absolute: 0.5 K/uL (ref 0.1–1.0)
Monocytes Relative: 6.7 % (ref 3.0–12.0)
Neutro Abs: 4.8 K/uL (ref 1.4–7.7)
Neutrophils Relative %: 60.4 % (ref 43.0–77.0)
Platelets: 227 K/uL (ref 150.0–400.0)
RBC: 4.62 Mil/uL (ref 3.87–5.11)
RDW: 13.7 % (ref 11.5–15.5)
WBC: 7.9 K/uL (ref 4.0–10.5)

## 2024-01-28 LAB — LIPASE: Lipase: 23 U/L (ref 11.0–59.0)

## 2024-01-28 MED ORDER — PANTOPRAZOLE SODIUM 40 MG PO TBEC: 40.0000 mg | DELAYED_RELEASE_TABLET | Freq: Every day | ORAL | 3 refills | Status: DC

## 2024-01-28 MED ORDER — SUCRALFATE 1 G PO TABS: 1.0000 g | ORAL_TABLET | Freq: Three times a day (TID) | ORAL | 0 refills | Status: AC

## 2024-01-28 NOTE — Patient Instructions (Signed)
 Avoid NSAIDS, alcohol and spicy or greasy foods.   Hydrate well.  Carafate  use for 2 weeks Protonix  once a day for 4 weeks.      Inflammation of the Stomach in Adults (Gastritis): What to Know Gastritis is when the lining of your stomach gets red and swollen (inflammation). There're two kinds of gastritis. There's gastritis that happens quickly. This is called acute gastritis. There's gastritis that happens over a long time. This is called chronic gastritis. Gastritis must be treated. If not, you can have sores or bleeding in your stomach. What are the causes? Germs that get to your stomach and cause an infection. Drinking too much alcohol. Taking some medicines. Too much acid in the stomach. Disease of the stomach. Allergies. Cancer treatments (radiation). Smoking cigarettes or using products that contain nicotine or tobacco. What increases the risk? Having a disease of the intestines. Having an autoimmune disease, such as Crohn's disease. This is when your immune system attacks other organs in the body. Using aspirin, ibuprofen , and other NSAIDs to treat other conditions. Stress. What are the signs or symptoms? Pain or burning in your belly. Feeling like you may throw up. Throwing up. Feeling too full after you eat. Losing weight. Bad breath. Blood in your throw-up or poop. In some cases, there are no symptoms. How is this treated? Gastritis is treated with medicines. The medicines that are used depend on what caused the condition. If the condition was caused by germs (bacteria), you'll be given antibiotics. If the condition was caused by too much acid in the stomach, you'll be given antacids, H2 blockers, or proton pump inhibitors. You may also be told to stop taking certain medicines, such as aspirin, ibuprofen , and other NSAIDs. Follow these instructions at home: Medicines Take your medicines only as told. Take your antibiotics as told. Do not stop taking them even  if you start to feel better. Alcohol use Do not drink alcohol if: Your doctor tells you not to drink. You are pregnant, may be pregnant, or are planning to become pregnant. If you drink alcohol: Limit how much you have to: 0-1 drink a day if you're female. 0-2 drinks a day if you're female. Know how much alcohol is in your drink. In the [place] one drink is one 12 oz bottle of beer (355 mL), one 5 oz glass of wine (148 mL), or one 1 oz glass of hard liquor (44 mL). General instructions Eat small meals often, instead of large meals. Avoid foods and drinks that make your symptoms worse. Drink more fluid as told. Do not smoke, vape, or use nicotine or tobacco. Talk with your doctor about ways to manage stress. You may be told to: Get regular exercise. Do deep breathing. Do meditation or yoga. Contact a doctor if: Your symptoms get worse. The pain in your belly gets worse. Your symptoms go away and then come back. You have a fever. Get help right away if: You throw up blood or a substance that looks like coffee grounds. You have black or dark red poop. You throw up every time you drink something. These symptoms may be an emergency. Call 911 right away. Do not wait to see if the symptoms will go away. Do not drive yourself to the hospital. This information is not intended to replace advice given to you by your health care provider. Make sure you discuss any questions you have with your health care provider. Document Revised: 06/15/2023 Document Reviewed: 06/15/2023 Elsevier Patient Education  2025  [employer].

## 2024-01-28 NOTE — Progress Notes (Signed)
 Jade [name] , October 16, 1960, 63 y.o., female MRN: [id number] Patient Care Team    Relationship Specialty Notifications Start End  Catherine Charlies LABOR, DO PCP - General Family Medicine  01/27/23   Abran Norleen SAILOR, MD Consulting Physician Gastroenterology  05/14/17   Claudene Lenis, OD Referring Physician Optometry  05/14/17   Elizabeth Silvano HERO, MD Consulting Physician Allergy  05/14/17   Tobie Franky SQUIBB, DPM Consulting Physician Podiatry  01/28/24   Kennyth Chew, MD Consulting Physician Cardiology  01/28/24   O'Neal, Darryle Ned, MD Consulting Physician Cardiology  01/28/24   Marne Kelly Nest, MD Consulting Physician Obstetrics and Gynecology  01/28/24   Sharl Selinda Dover, MD Consulting Physician Orthopedic Surgery  01/28/24     Chief Complaint  Patient presents with   Abdominal Pain    Sunday; abdominal pain, cramping, nausea, constipation. Pt feels she has improved but still c/o cramping. Denies vomiting, diarrhea.      Subjective: Jade [name] is a 63 y.o. Pt presents for an OV with complaints of upper epigastric pain of 5 days duration.  Associated symptoms include mild sore throat on Friday, chills on Saturday and then epigastric pain started on Sunday.  She reports she does have grandchildren which have been sick. Pt has tried nothing to ease their symptoms. Reports she has not had a fever or vomiting.  She is tolerating p.o. She reports a stomach fullness that feels like constipation, which she reports she has had a bowel movement yesterday and today, both normal, no melena.  S/p abdominal hysterectomy Colonoscopy 10/2020-Dr. Abran.  Normal with the exception of a few diverticula in the right colon.     10/15/2023    8:11 AM 06/04/2023    1:58 PM 02/08/2023    9:42 AM 10/09/2022    2:44 PM 09/19/2021    8:09 AM  Depression screen PHQ 2/9  Decreased Interest 0 0 1 0 0  Down, Depressed, Hopeless 0 0 0 0 0  PHQ - 2 Score 0 0 1 0 0  Altered sleeping 1 0     Tired,  decreased energy 1 0     Change in appetite 0 0     Feeling bad or failure about yourself  0 0     Trouble concentrating 0 0     Moving slowly or fidgety/restless 0 0     Suicidal thoughts 0 0     PHQ-9 Score 2 0     Difficult doing work/chores Not difficult at all Not difficult at all       Allergies  Allergen Reactions   Ciprofloxacin Anaphylaxis and Other (See Comments)   Eliquis  [Apixaban ] Shortness Of Breath    SOB/heavy chest   Sulfa Antibiotics     hives   Cat Dander Other (See Comments)   Dog Epithelium (Canis Lupus Familiaris) Other (See Comments)   Social History   Social History Narrative   Married. One child.   College-educated, works as a Armed forces operational officer.   Smoke alarm in the home. Wears her seatbelt.   Feels safe in her relationship.   Past Medical History:  Diagnosis Date   Anxiety    Asthma    uses inhaler prn   Chest pain    Colon polyps    Diarrhea    with constipation while taking diet pills (Alli)   History of rectal bleeding     2 times in past   HSV-2 (herpes simplex virus 2) infection  Palpitations    in past/due to Advil  pm   Vitamin D  deficiency    Past Surgical History:  Procedure Laterality Date   ABDOMINAL HYSTERECTOMY  1998   fibroids. partial   ATRIAL FIBRILLATION ABLATION N/A 04/20/2023   Procedure: ATRIAL FIBRILLATION ABLATION;  Surgeon: Kennyth Chew, MD;  Location: [facility] INVASIVE CV LAB;  Service: Cardiovascular;  Laterality: N/A;   CESAREAN SECTION     1 time   COLONOSCOPY W/ POLYPECTOMY  2017   RESECTOSCOPIC MYOMECTOMY     Family History  Problem Relation Age of Onset   Asthma Mother    Diabetes Father    Hypertension Father    Heart disease Father    Prostate cancer Father    Atrial fibrillation Sister    Thyroid  disease Sister    Atrial fibrillation Brother    Diabetes Paternal Uncle    Lung cancer Paternal Grandfather    Colon cancer Neg Hx    Colon polyps Neg Hx    Rectal cancer Neg Hx    Stomach cancer Neg  Hx    Esophageal cancer Neg Hx    Allergies as of 01/28/2024       Reactions   Ciprofloxacin Anaphylaxis, Other (See Comments)   Eliquis  [apixaban ] Shortness Of Breath   SOB/heavy chest   Sulfa Antibiotics    hives   Cat Dander Other (See Comments)   Dog Epithelium (canis Lupus Familiaris) Other (See Comments)        Medication List        Accurate as of January 28, 2024 12:16 PM. If you have any questions, ask your nurse or doctor.          Airsupra 90-80 MCG/ACT Aero Generic drug: Albuterol-Budesonide SMARTSIG:2 Puff(s) Via Inhaler Every 2 Hours PRN   albuterol 108 (90 Base) MCG/ACT inhaler Commonly known as: VENTOLIN HFA Inhale into the lungs every 6 (six) hours as needed for wheezing or shortness of breath.   colchicine  0.6 MG tablet Take 0.6 mg by mouth daily.   diclofenac  75 MG EC tablet Commonly known as: VOLTAREN  Take 1 tablet (75 mg total) by mouth 2 (two) times daily. What changed:  when to take this reasons to take this   furosemide  20 MG tablet Commonly known as: LASIX  TAKE 1 TABLET (20 MG TOTAL) BY MOUTH DAILY AS NEEDED (WT GAIN/SHORTNESS OF BREATH).   ipratropium-albuterol 0.5-2.5 (3) MG/3ML Soln Commonly known as: DUONEB SMARTSIG:1 Vial(s) Every 4-6 Hours PRN   metoprolol  succinate 25 MG 24 hr tablet Commonly known as: TOPROL -XL Take 25 mg by mouth daily.   montelukast  10 MG tablet Commonly known as: SINGULAIR  Take 1 tablet (10 mg total) by mouth at bedtime.   pantoprazole  40 MG tablet Commonly known as: PROTONIX  Take 1 tablet (40 mg total) by mouth daily.   sucralfate  1 g tablet Commonly known as: Carafate  Take 1 tablet (1 g total) by mouth 4 (four) times daily -  with meals and at bedtime. Started by: Charlies Bellini   Trelegy Ellipta 200-62.5-25 MCG/ACT Aepb Generic drug: Fluticasone -Umeclidin-Vilant Inhale 1 puff into the lungs daily.   valACYclovir  500 MG tablet Commonly known as: VALTREX  Take 1 tablet (500 mg total) by mouth  daily.   Xarelto  20 MG Tabs tablet Generic drug: rivaroxaban  Take 20 mg by mouth daily with supper.        All past medical history, surgical history, allergies, family history, immunizations andmedications were updated in the EMR today and reviewed under the history and medication portions  of their EMR.     Review of Systems  Constitutional:  Positive for chills and malaise/fatigue. Negative for fever.  HENT: Negative.    Eyes: Negative.   Respiratory: Negative.    Cardiovascular: Negative.   Gastrointestinal:  Positive for abdominal pain and nausea. Negative for constipation, diarrhea, melena and vomiting.  Genitourinary: Negative.   Musculoskeletal: Negative.   Skin:  Negative for rash.  Neurological: Negative.   All other systems reviewed and are negative.  Negative, with the exception of above mentioned in HPI   Objective:  BP 106/72   Pulse 83   Temp 98.3 F (36.8 C)   Wt 223 lb (101.2 kg)   SpO2 97%   BMI 35.99 kg/m  Body mass index is 35.99 kg/m. Physical Exam Vitals and nursing note reviewed.  Constitutional:      General: She is not in acute distress.    Appearance: Normal appearance. She is normal weight. She is not ill-appearing or toxic-appearing.  HENT:     Head: Normocephalic and atraumatic.  Eyes:     General: No scleral icterus.       Right eye: No discharge.        Left eye: No discharge.     Extraocular Movements: Extraocular movements intact.     Conjunctiva/sclera: Conjunctivae normal.     Pupils: Pupils are equal, round, and reactive to light.  Abdominal:     General: Abdomen is protuberant. Bowel sounds are normal. There is no distension. There are no signs of injury.     Palpations: Abdomen is soft. There is no fluid wave, hepatomegaly or mass.     Tenderness: There is abdominal tenderness in the epigastric area. There is no guarding or rebound. Negative signs include Murphy's sign, McBurney's sign and psoas sign.     Hernia: No hernia  is present.  Skin:    Findings: No rash.  Neurological:     Mental Status: She is alert and oriented to person, place, and time. Mental status is at baseline.     Motor: No weakness.     Coordination: Coordination normal.     Gait: Gait normal.  Psychiatric:        Mood and Affect: Mood normal.        Behavior: Behavior normal.        Thought Content: Thought content normal.        Judgment: Judgment normal.      No results found. No results found. No results found for this or any previous visit (from the past 24 hours).  Assessment/Plan: Makhya Arave is a 63 y.o. female present for OV for  Abdominal pain, unspecified abdominal location (Primary)/Acute gastritis without hemorrhage, unspecified gastritis type - CBC w/Diff - Comp Met (CMET) - Lipase - C-reactive protein Suspect symptoms are most likely related to a viral gastritis.  Will collect labs today to rule out pancreatitis or colitis.  She has mild tenderness on exam directly over anatomical stomach and duodenal bulb. Discussed bland soft diet Hydrate well Start Protonix  40 mg daily for 4 weeks Start Carafate  3 times daily AC and nightly for 2 weeks Avoid use of NSAIDs, or alcohol or consuming spicy/greasy foods for the next week or more  Reviewed expectations re: course of current medical issues. Discussed self-management of symptoms. Outlined signs and symptoms indicating need for more acute intervention. Patient verbalized understanding and all questions were answered. Patient received an After-Visit Summary.    Orders Placed This Encounter  Procedures  CBC w/Diff   Comp Met (CMET)   Lipase   C-reactive protein   Meds ordered this encounter  Medications   pantoprazole  (PROTONIX ) 40 MG tablet    Sig: Take 1 tablet (40 mg total) by mouth daily.    Dispense:  30 tablet    Refill:  3   sucralfate  (CARAFATE ) 1 g tablet    Sig: Take 1 tablet (1 g total) by mouth 4 (four) times daily -  with meals and at  bedtime.    Dispense:  120 tablet    Refill:  0   Referral Orders  No referral(s) requested today     Note is dictated utilizing voice recognition software. Although note has been proof read prior to signing, occasional typographical errors still can be missed. If any questions arise, please do not hesitate to call for verification.   electronically signed by:  Charlies Bellini, DO  [facility] [place]

## 2024-01-30 ENCOUNTER — Other Ambulatory Visit: Payer: Self-pay | Admitting: Cardiovascular Disease

## 2024-01-31 ENCOUNTER — Ambulatory Visit: Payer: Self-pay | Admitting: Family Medicine

## 2024-02-03 NOTE — Progress Notes (Unsigned)
 Cardiology Office Note:  .   Date:  02/03/2024  ID:  Jade [name], DOB Feb 17, 1961, MRN [id number] PCP: Jade [name] LABOR, DO  [facility] Providers Cardiologist:  None {  History of Present Illness: Jade [name]   Jade [name] is a 63 y.o. female w/PMHx of AFib  AFib ablation 04/20/23  ER 04/27/23 Post ablation pericarditis TTE noted LVEF 55-60%, no WMA< no pericardial effusion Symptom better after toradol  Elevated Trops as expected post ablation Normal coronary arteries by CT known for her Discharged 04/23/23 On colchicine  0.6mg  daily for a month  called office service 04/25/23, pt reported initially symptoms much improved initially though this day reported Asked her to start ibuprofen  800 mg tid in addition to colchicine  0.6 mg bid   ER 04/27/23 Escalating symptoms, heavy chest, SOB >> as well as nausea followed by feeling weak, clammy, legs/feet feel weak like they will give out  Pericarditis Post-ablation Patient reassured. Will take measures to avoid triggers (reclining) Continue NSAID, which she appears to be tolerating  Nausea, GI distress Started after colchicine , likely side-effect Will hold colchicine  for now Weakness, diaphoresis, pallor Consistent with vagal episodes triggered by nausea and pain   Seeing AFib clinic a couple times and Dr. Kennyth most recently on 05/27/23, continued with symptoms, colchicine  and NSAI giving her only temporary relief CT scan of chest.  - Repeat CBC, CRP.  - If CRP elevated then empirically add steroids for pericarditis and order cardiac MRI to look for pericardial inflammation.  - Continue colchicine  and ibuprofen  for now.  Will cover with GI prophylaxis - pantoprazole  and sucralfate .  Discussed stopping Eliquis  at the 3 mo mark  05/29/23: Dr. Kennyth: based on symptoms and temporal correlation with ablation, we will move forward with aggressive treatment for relapsing/recurrent pericarditis. She cannot be on high dose NSAIDs or  aspirin due to need for Eliquis  post ablation. For this reason, we will add a low dose prednisone  taper in addition to a 3 month course of colchicine    Saw Dr. Burton 06/03/23 Initially, 20 mg of prednisone  provided significant relief, but symptoms worsened upon reducing the dose to 10 mg. She finds the higher dose more effective. Ibuprofen  also helps, but she is concerned about its interaction with her blood thinner, Eliquis . CMR planned for March. She expresses concern about the delay in imaging and the impact of her symptoms on her ability to work, having had to take a day off due to discomfort.   06/04/23: c.MRI negative for pericarditis ? If symptoms 2/2 Eliquis  > Xarelto     I saw her 07/16/23 Thankfully she is doing better The day she made the switch to xarelto  she felt better She does not think she has had AFib, on a efew occasions through the last few months has had some awareness of her HR towards 110's but nothing like her AFib Since feeling better  Has done some gardening, lifting heavy bags of dirt etc.. does not have the same exertional capacity she did, but suspects deconditioned from the last 3 mo of being so sedentary.. 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  Referred to Rheumatology  I saw her 09/17/23 She had been doing quite well until last weekend Had  been gardening, doing some heaver physical work, without symptoms though got a sense of her AFib, heart beat felt different and a bit fast, checked the rate was 106, relaxed some and it settled away Did feel like her AFib, was alarming/worrisome to her Monday  at work [employer]) was dong nothing out of the ordinary started to get tightness in her chest making her SOB, eventually took off her bra feeling a some relief.Similar symptoms Tuesday, none since She does not think this symptoms is anxiety related, doesn't feel like she has anything in her life to feel anxious about, family/work life are good, she is nearing retirement and really looking forward to it Given her extensive cardiac evaluation, in d/w Dr. Kennyth, felt confident her symptoms were not of cardiac origin and advised to f/u with her PMD for potential non-cardiac etiologies  Today's visit is scheduled as a 58mo follow up visit ROS:   *** symptoms *** AFib, otherwise *** annual visit?  Arrhythmia/AAD hx AFib/PV ATach Flecainide  prescribed Oct 2024 >> never started Post ablation CP, SOB discussed above with extensive w/u  Studies Reviewed: Jade [name]    EKG not done today  06/04/23; c.MRI IMPRESSION: 1. Normal LV size and function LVEF 54% 2. No delayed gadolinium enhancement in myocardium or pericardium No pericardial effusion 3.  Normal RV size and function RVEF 54% 4.  Mild LAE 5.  Trivial MR 6.  Estimated cardiac output 4.5 L/min 7.  Normal T1/T2 with nonspecific mildly dilated ECV 33%  05/28/23: Chest CT IMPRESSION: 1. No evidence of pulmonary embolism. 2. Mild mosaic attenuation of the lungs, which can be seen in the setting of small airways disease. 3. Prominent left subpectoral lymph node measuring 10 mm short axis, nonspecific. Correlation with recent mammogram is recommended. 4. Aortic atherosclerosis (ICD10-I70.0).  Echo 04/27/23:  1. Left ventricular ejection fraction, by estimation, is 55 to 60%. The  left ventricle has normal function. The left ventricle has no regional  wall motion abnormalities.   2. Small iatrogenic ASD post ablation, flow all left to right.   3. There is no evidence of cardiac tamponade.   4. The inferior vena cava is normal in size  with greater than 50%  respiratory variability, suggesting right atrial pressure of 3 mmHg.    PVI ablation 04/20/23 (PFA) CONCLUSIONS: 1. Successful PVI 2. Successful ablation/isolation of the posterior wall 3. Intracardiac echo reveals normal LV size and function, trivial pericardial effusion, normal PV anatomy. 4. No early apparent complications. 5. Resume Eliquis  in 2 hours.   01/29/23: Coronary CT FINDINGS: Non-cardiac: See separate report from [facility] Rad[facility]. Normal caliber ascending aorta. Pulmonary veins drain normally to the left atrium. Calcium Score: 0 Agatston units. Coronary Arteries: Right dominant with no anomalies LM: No plaque or stenosis. LAD system:  No plaque or stenosis. Circumflex system: No plaque or stenosis. RCA system:  No plaque or stenosis.   IMPRESSION: 1. Coronary artery calcium score 0 Agatston units. This suggests low risk for future cardiac events. 2.  No significant coronary artery disease noted.  Risk Assessment/Calculations:    Physical Exam:   VS:  There were no vitals taken for this visit.   Wt Readings from Last 3 Encounters:  01/28/24 223 lb (101.2 kg)  10/15/23 217 lb 12.8 oz (98.8 kg)  09/17/23 222 lb 8 oz (100.9 kg)    GEN: Well nourished, well developed in no acute distress NECK:  No JVD; No carotid bruits CARDIAC: *** RRR, no murmurs, rubs, gallops RESPIRATORY: *** CTA b/l without rales, wheezing or rhonchi  ABDOMEN: Soft, non-tender, non-distended EXTREMITIES: *** No edema; No deformity   ASSESSMENT AND PLAN: .    paroxysmal AFib CHA2DS2Vasc is one (for aortic atherosclerosis seen on CT) Post ablation pericarditis ***  2. Chest tightness/symptom Prior cardiac testing is very reassuring with no coronary ca Preserved LVEF D/w Dr. Kennyth, very confident not a cardiac etiology Would further d/w her PMD    Dispo: back in *** mo,  sooner if needed  Signed, [name] Macario Arthur, PA-C

## 2024-02-04 ENCOUNTER — Ambulatory Visit: Attending: Physician Assistant | Admitting: Physician Assistant

## 2024-02-04 VITALS — BP 100/60 | HR 85 | Ht 66.0 in | Wt 222.0 lb

## 2024-02-04 DIAGNOSIS — I480 Paroxysmal atrial fibrillation: Secondary | ICD-10-CM

## 2024-02-04 NOTE — Patient Instructions (Signed)
 Medication Instructions:   Your physician recommends that you continue on your current medications as directed. Please refer to the Current Medication list given to you today.  *If you need a refill on your cardiac medications before your next appointment, please call your pharmacy*   Lab Work: NONE ORDERED  TODAY    If you have labs (blood work) drawn today and your tests are completely normal, you will receive your results only by: MyChart Message (if you have MyChart) OR A paper copy in the mail If you have any lab test that is abnormal or we need to change your treatment, we will call you to review the results.    Testing/Procedures: NONE ORDERED  TODAY    Follow-Up: At [facility], you and your health needs are our priority.  As part of our continuing mission to provide you with exceptional heart care, our providers are all part of one team.  This team includes your primary Cardiologist (physician) and Advanced Practice Providers or APPs (Physician Assistants and Nurse Practitioners) who all work together to provide you with the care you need, when you need it.  Your next appointment:  6 month(s)  Provider:  You may see  Charlies Arthur, PA-C    We recommend signing up for the patient portal called MyChart.  Sign up information is provided on this After Visit Summary.  MyChart is used to connect with patients for Virtual Visits (Telemedicine).  Patients are able to view lab/test results, encounter notes, upcoming appointments, etc.  Non-urgent messages can be sent to your provider as well.   To learn more about what you can do with MyChart, go to [email or web address].    Other Instructions

## 2024-02-08 DIAGNOSIS — H43812 Vitreous degeneration, left eye: Secondary | ICD-10-CM | POA: Diagnosis not present

## 2024-02-11 NOTE — Progress Notes (Signed)
 Office Visit Note  Patient: Jade [name]             Date of Birth: [birth year]           MRN: [id number]             PCP: Catherine Charlies LABOR, DO Referring: Catherine Charlies LABOR, DO Visit Date: 02/25/2024 Occupation: Dental Hygienist  Subjective:  Positive ANA, joint pain  History of Present Illness: Jade [name] is a 63 y.o. female seen for the evaluation of polyarthralgia and positive ANA.  According the patient her symptoms started about 5 years ago with increased hand pain.  At the time she was seen by her PCP and was referred here.  She states over time her symptoms improved and she canceled the appointment.  She states the joint pain persists over the years and had been gradually getting worse.  She describes discomfort in her neck, lower back, both hands, hips, knees and her feet.  She had her left knee joint injury in the past and had arthroscopic surgery which is more painful.  She notices swelling in her hands.  She was seen by her PCP in May 2025 at that time she had labs which came positive for ANA.  She was referred to me for further evaluation.  She denies any history of oral ulcers, nasal ulcers, sicca symptoms, malar rash, photosensitivity, Raynaud's, hair loss.  She gives history of joint pain and inflammation.  There is no family history of autoimmune disease.  She is left-handed, works as a armed forces operational officer.  She is married, gravida 1, para 1.  There is no history of preeclampsia or DVTs.  She drinks alcohol occasionally and never been a smoker.  She enjoys gardening. Patient states she had an abscessed tooth which was extracted last week.  She was treated with antibiotics.    Activities of Daily Living:  Patient reports morning stiffness for 20 minutes.   Patient Reports nocturnal pain.  Difficulty dressing/grooming: Reports Difficulty climbing stairs: Reports Difficulty getting out of chair: Reports Difficulty using hands for taps, buttons, cutlery, and/or writing:  Reports  Review of Systems  Constitutional:  Positive for fatigue.  HENT:  Negative for mouth sores and mouth dryness.   Eyes:  Negative for dryness.  Respiratory:  Negative for shortness of breath.   Cardiovascular:  Negative for chest pain and palpitations.  Gastrointestinal:  Negative for blood in stool, constipation and diarrhea.  Endocrine: Negative for increased urination.  Genitourinary:  Negative for involuntary urination.  Musculoskeletal:  Positive for joint pain, gait problem, joint pain, joint swelling, myalgias, morning stiffness and myalgias. Negative for muscle weakness and muscle tenderness.  Skin:  Negative for color change, rash, hair loss and sensitivity to sunlight.  Allergic/Immunologic: Positive for susceptible to infections.  Neurological:  Negative for dizziness and headaches.  Hematological:  Negative for swollen glands.  Psychiatric/Behavioral:  Positive for sleep disturbance. Negative for depressed mood. The patient is not nervous/anxious.     PMFS History:  Patient Active Problem List   Diagnosis Date Noted   Polyarthralgia 08/30/2023   Positive ANA (antinuclear antibody) 08/30/2023   Ds DNA antibody positive 08/30/2023   Calf tenderness 08/27/2023   Paroxysmal atrial fibrillation (HCC)-status post ablation 04/30/2023   Pericarditis 04/22/2023   Hepatic steatosis 04/16/2023   Chondromalacia of left patella 09/08/2021   DDD (degenerative disc disease), lumbar 04/07/2019   Nonallopathic lesion of lumbosacral region 04/07/2019   Nonallopathic lesion of sacral region 04/07/2019   Inflammation of  sacroiliac joint 11/25/2018   Lumbar spondylosis 08/30/2018   Arthritis of carpometacarpal [facility]) joint of right thumb 12/24/2017   Nonallopathic lesion of cervical region 12/24/2017   Nonallopathic lesion of thoracic region 12/24/2017   Nonallopathic lesion of rib cage 12/24/2017   Obesity (BMI 30-39.9) 05/14/2017   H/O vitamin D  deficiency 01/24/2016    Menopausal symptoms 12/02/2012   Asthma    HSV-2 (herpes simplex virus 2) infection     Past Medical History:  Diagnosis Date   Anxiety    Asthma    uses inhaler prn   Chest pain    Colon polyps    Diarrhea    with constipation while taking diet pills (Alli)   History of rectal bleeding     2 times in past   HSV-2 (herpes simplex virus 2) infection    Palpitations    in past/due to Advil  pm   Vitamin D  deficiency     Family History  Problem Relation Age of Onset   Asthma Mother    Diabetes Father    Hypertension Father    Heart disease Father    Prostate cancer Father    Thyroid  disease Sister    Atrial fibrillation Sister    Atrial fibrillation Brother    Diabetes Paternal Uncle    Lung cancer Paternal Grandfather    Healthy Daughter    Colon cancer Neg Hx    Colon polyps Neg Hx    Rectal cancer Neg Hx    Stomach cancer Neg Hx    Esophageal cancer Neg Hx    Past Surgical History:  Procedure Laterality Date   ABDOMINAL HYSTERECTOMY  1998   fibroids. partial   ATRIAL FIBRILLATION ABLATION N/A 04/20/2023   Procedure: ATRIAL FIBRILLATION ABLATION;  Surgeon: Kennyth Chew, MD;  Location: [facility] INVASIVE CV LAB;  Service: Cardiovascular;  Laterality: N/A;   CATARACT EXTRACTION Bilateral    2020, 2025   CESAREAN SECTION     1 time   COLONOSCOPY W/ POLYPECTOMY  2017   RESECTOSCOPIC MYOMECTOMY     Social History   Tobacco Use   Smoking status: Never    Passive exposure: Past   Smokeless tobacco: Never   Tobacco comments:    Never smoked 04/30/23  Vaping Use   Vaping status: Never Used  Substance Use Topics   Alcohol use: Yes    Alcohol/week: 1.0 - 2.0 standard drink of alcohol    Types: 1 - 2 Glasses of wine per week    Comment: 1-2 glasses of wine occ. 04/30/23   Drug use: No   Social History   Social History Narrative   Married. One child.   College-educated, works as a armed forces operational officer.   Smoke alarm in the home. Wears her seatbelt.   Feels safe in  her relationship.     Immunization History  Administered Date(s) Administered   Hepb-cpg 09/19/2021, 10/24/2021   Influenza,inj,Quad PF,6+ Mos 03/31/2019   Influenza-Unspecified 01/25/2017, 01/25/2021, 01/09/2023   Moderna Sars-Covid-2 Vaccination 04/18/2019, 05/12/2019   PNEUMOCOCCAL CONJUGATE-20 10/15/2023   Tdap 09/13/2020   Zoster Recombinant(Shingrix) 09/13/2020, 12/20/2020     Objective: Vital Signs: BP 103/70 (BP Location: Right Arm, Patient Position: Sitting, Cuff Size: Large)   Pulse 80   Temp 98.4 F (36.9 C)   Resp 17   Ht 5' 6 (1.676 m)   Wt 225 lb (102.1 kg)   BMI 36.32 kg/m    Physical Exam Vitals and nursing note reviewed.  Constitutional:  Appearance: She is well-developed.  HENT:     Head: Normocephalic and atraumatic.  Eyes:     Conjunctiva/sclera: Conjunctivae normal.  Cardiovascular:     Rate and Rhythm: Normal rate and regular rhythm.     Heart sounds: Normal heart sounds.  Pulmonary:     Effort: Pulmonary effort is normal.     Breath sounds: Normal breath sounds.  Abdominal:     General: Bowel sounds are normal.     Palpations: Abdomen is soft.  Musculoskeletal:     Cervical back: Normal range of motion.  Lymphadenopathy:     Cervical: No cervical adenopathy.  Skin:    General: Skin is warm and dry.     Capillary Refill: Capillary refill takes less than 2 seconds.  Neurological:     Mental Status: She is alert and oriented to person, place, and time.  Psychiatric:        Behavior: Behavior normal.      Musculoskeletal Exam: Cervical, thoracic and lumbar spine were in good range of motion.  She did comfort range of motion of her lumbar spine and tenderness over the lower lumbar region.  There was no SI joint tenderness.  Shoulder joints, elbow joints, wrist joints, MCPs, PIPs and DIPs were in good range of motion with no synovitis.  She had bilateral PIP and DIP thickening.  Hip joints and knee joints were in good range of motion  without any warmth swelling or effusion.  She had tenderness over bilateral trochanteric bursa.  There was no tenderness over ankles or MTPs.  Bilateral pes cavus and dorsal spurs were noted.  CDAI Exam: CDAI Score: -- Patient Global: --; Provider Global: -- Swollen: --; Tender: -- Joint Exam 02/25/2024   No joint exam has been documented for this visit   There is currently no information documented on the homunculus. Go to the Rheumatology activity and complete the homunculus joint exam.  Investigation: No additional findings.  Imaging: DG Ankle Complete Left Result Date: 02/18/2024 Please see detailed radiograph report in office note.   Recent Labs: Lab Results  Component Value Date   WBC 7.9 01/28/2024   HGB 13.1 01/28/2024   PLT 227.0 01/28/2024   NA 139 01/28/2024   K 3.9 01/28/2024   CL 104 01/28/2024   CO2 27 01/28/2024   GLUCOSE 109 (H) 01/28/2024   BUN 15 01/28/2024   CREATININE 0.72 01/28/2024   BILITOT 0.4 01/28/2024   ALKPHOS 56 01/28/2024   AST 17 01/28/2024   ALT 13 01/28/2024   PROT 6.8 01/28/2024   ALBUMIN 4.2 01/28/2024   CALCIUM 9.3 01/28/2024   Aug 27, 2023 ANA 1: 80 NS, dsDNA 25, SSA 2.9, SSB negative, Smith negative, RNP negative, SCL 70 negative, RF negative, anti-CCP negative, sed rate 23 October 15, 2023 LDL 127, B12 357, folate 11.7, hemoglobin A1c 5.9, TSH 2.14, vitamin D24.25 January 28, 2024 CRP<0.5  Speciality Comments: No specialty comments available.  Procedures:  No procedures performed Allergies: Ciprofloxacin, Eliquis  [apixaban ], Sulfa antibiotics, Cat dander, and Dog epithelium (canis lupus familiaris)   Assessment / Plan:     Visit Diagnoses: Polyarthralgia-patient complains of pain and discomfort in multiple joints over the last 5 years.  She complains of discomfort in her neck, lower back, hands, hips, knees, feet.  Positive ANA (antinuclear antibody) -she was found to have positive ANA, double-stranded ENA and SSA antibody in  May 2025.  She denies any history of oral ulcers, nasal ulcers, malar rash, sicca symptoms, Raynaud's, lymphadenopathy, hair  loss.  She gives history of joint pain and joint discomfort.  Patient states she recently had dental abscess and tooth extraction.  She will come back in 10 days for repeat lab work.  I did detailed discussion regarding the significance of positive ANA, double-stranded ENA and positive SSA antibody association with Sjogren's and also lupus was discussed.  However she does not have  many symptoms except for joint pain.- Plan: Protein / creatinine ratio, urine, ANA, Sjogrens syndrome-A extractable nuclear antibody, Anti-DNA antibody, double-stranded, C3 and C4, Beta-2 glycoprotein antibodies, Cardiolipin antibodies, IgG, IgM, IgA, Lupus Anticoagulant Eval w/Reflex, Glucose 6 phosphate dehydrogenase, Thiopurine methyltransferase(tpmt)rbc, Serum protein electrophoresis with reflex, QuantiFERON-TB Gold Plus, Hepatitis B core antibody, IgM, Hepatitis B surface antigen, Hepatitis C antibody  Arthritis of carpometacarpal (CMC) joint of right thumb  Pain in both hands -she complains of pain and discomfort in her bilateral hands.  She has worked as a armed forces operational officer for many years.  She also enjoys gardening.  Bilateral CMC, PIP and DIP thickening was noted.  Clinical findings were suggestive of osteoarthritis.  Detail counts regarding osteoarthritis was provided.  A handout on osteoarthritis was provided.  No synovitis was noted on the examination.  I will obtain x-rays and labs.  A handout on hand muscle strength exercise was given.  Joint protection was discussed.  Plan: XR Hand 2 View Right, XR Hand 2 View Left.  The x-rays were suggestive of osteoarthritis.  Trochanteric bursitis of both hips -she complains of discomfort in her hips.  She describes nocturnal pain when she sleeps on her side.  Her findings are suggestive of bilateral trochanteric bursitis.  IT band stretches were  demonstrated.  Chondromalacia of left patella-she has intermittent pain in her knee joints.  She states she had left knee joint arthroscopic surgery in the past due to an injury.  Pain in both feet -she complains of discomfort in her ankles and her feet.  She has seen a podiatrist for left ankle pain.  She was diagnosed with plantar fasciitis.  Patient states she has not had much relief so far.  She is using orthotics.  Plan: XR Foot 2 Views Right, XR Foot 2 Views Left.  The x-ray was suggestive of osteoarthritis.  Pes cavus of both feet-she has bilateral pes cavus and dorsal spurs.  Pain, neck -she complains of discomfort and stiffness in her cervical spine for many years.  She denies any radiculopathy.  A handout on neck exercises was given.  Plan: XR Cervical Spine 2 or 3 views.  No significant disc space narrowing was noted.  Anterior osteophytes were noted.  Facet joint arthropathy was noted.  Chronic midline low back pain without sciatica -she complains of lower back pain.  A handout on back exercises was given.  Plan: XR Lumbar Spine 2-3 Views.  Lumbar facet joint arthropathy was noted.  Paroxysmal atrial fibrillation (HCC)-status post ablation - Post ablation pericarditis.  I advised her to get clearance from her cardiologist in case we have to use hydroxychloroquine.  Hepatic steatosis  Vitamin D  deficiency-she has been taking vitamin D .  Moderate asthma, unspecified whether complicated, unspecified whether persistent - Intermittent asthma with occasional severe flares.  Orders: Orders Placed This Encounter  Procedures   XR Hand 2 View Right   XR Hand 2 View Left   XR Foot 2 Views Right   XR Foot 2 Views Left   XR Cervical Spine 2 or 3 views   XR Lumbar Spine 2-3 Views   Protein /  creatinine ratio, urine   ANA   Sjogrens syndrome-A extractable nuclear antibody   Anti-DNA antibody, double-stranded   C3 and C4   Beta-2 glycoprotein antibodies   Cardiolipin antibodies, IgG,  IgM, IgA   Lupus Anticoagulant Eval w/Reflex   Glucose 6 phosphate dehydrogenase   Thiopurine methyltransferase(tpmt)rbc   Serum protein electrophoresis with reflex   QuantiFERON-TB Gold Plus   Hepatitis B core antibody, IgM   Hepatitis B surface antigen   Hepatitis C antibody   No orders of the defined types were placed in this encounter.    Follow-Up Instructions: Return for Positive ANA, polyarthralgia.   Maya Nash, MD  Note - This record has been created using Animal nutritionist.  Chart creation errors have been sought, but may not always  have been located. Such creation errors do not reflect on  the standard of medical care.

## 2024-02-18 ENCOUNTER — Ambulatory Visit

## 2024-02-18 ENCOUNTER — Ambulatory Visit: Admitting: Podiatry

## 2024-02-18 DIAGNOSIS — Q667 Congenital pes cavus, unspecified foot: Secondary | ICD-10-CM | POA: Diagnosis not present

## 2024-02-18 DIAGNOSIS — M722 Plantar fascial fibromatosis: Secondary | ICD-10-CM

## 2024-02-18 NOTE — Addendum Note (Signed)
 Addended by: Priscille Shadduck on: 02/18/2024 10:37 AM   Modules accepted: Orders

## 2024-02-18 NOTE — Progress Notes (Signed)
 Subjective:  Patient ID: Jade [name], female    DOB: Nov 01, 1960,  MRN: [id number]  Chief Complaint  Patient presents with   Plantar Fasciitis    Pt stated that her right foot is doing fine she still feels some discomfort with her left foot/ankle she stated that the pain in her ankle has been keeping her up at night she requested to have an ankle xray done today.     63 y.o. female presents with the above complaint.  Patient presents with complaint left ankle pain/capsulitis/ATFL instability.  She states that has been causing her more more discomfort.  She may be compensating due to plantar fasciitis.  She wanted to discuss treatment options for this.  Has been keeping her up at night.  She  feels like she is walking on the outside of her foot.  Pain scale 7 out of 10 dull aching nature would like to discuss treatment options for her.  Her orthotics are helping a little bit.  Her foot still feels like she is putting a lot of stress on the outside of the ankle   Review of Systems: Negative except as noted in the HPI. Denies N/V/F/Ch.  Past Medical History:  Diagnosis Date   Anxiety    Asthma    uses inhaler prn   Chest pain    Colon polyps    Diarrhea    with constipation while taking diet pills (Alli)   History of rectal bleeding     2 times in past   HSV-2 (herpes simplex virus 2) infection    Palpitations    in past/due to Advil  pm   Vitamin D  deficiency     Current Outpatient Medications:    AIRSUPRA 90-80 MCG/ACT AERO, SMARTSIG:2 Puff(s) Via Inhaler Every 2 Hours PRN, Disp: , Rfl:    albuterol (VENTOLIN HFA) 108 (90 Base) MCG/ACT inhaler, Inhale into the lungs every 6 (six) hours as needed for wheezing or shortness of breath., Disp: , Rfl:    diclofenac  (VOLTAREN ) 75 MG EC tablet, Take 1 tablet (75 mg total) by mouth 2 (two) times daily. (Patient taking differently: Take 75 mg by mouth as needed.), Disp: 180 tablet, Rfl: 1   furosemide  (LASIX ) 20 MG tablet, TAKE 1 TABLET  (20 MG TOTAL) BY MOUTH DAILY AS NEEDED (WT GAIN/SHORTNESS OF BREATH). (Patient not taking: Reported on 02/04/2024), Disp: , Rfl:    ipratropium-albuterol (DUONEB) 0.5-2.5 (3) MG/3ML SOLN, SMARTSIG:1 Vial(s) Every 4-6 Hours PRN, Disp: , Rfl:    montelukast  (SINGULAIR ) 10 MG tablet, Take 1 tablet (10 mg total) by mouth at bedtime., Disp: 90 tablet, Rfl: 3   pantoprazole  (PROTONIX ) 40 MG tablet, Take 1 tablet (40 mg total) by mouth daily., Disp: 30 tablet, Rfl: 3   sucralfate  (CARAFATE ) 1 g tablet, Take 1 tablet (1 g total) by mouth 4 (four) times daily -  with meals and at bedtime., Disp: 120 tablet, Rfl: 0   TRELEGY ELLIPTA 200-62.5-25 MCG/ACT AEPB, Inhale 1 puff into the lungs daily., Disp: , Rfl:    valACYclovir  (VALTREX ) 500 MG tablet, Take 1 tablet (500 mg total) by mouth daily., Disp: 90 tablet, Rfl: 3  Social History   Tobacco Use  Smoking Status Never  Smokeless Tobacco Never  Tobacco Comments   Never smoked 04/30/23    Allergies  Allergen Reactions   Ciprofloxacin Anaphylaxis and Other (See Comments)   Eliquis  [Apixaban ] Shortness Of Breath    SOB/heavy chest   Sulfa Antibiotics     hives  Cat Dander Other (See Comments)   Dog Epithelium (Canis Lupus Familiaris) Other (See Comments)   Objective:  There were no vitals filed for this visit. There is no height or weight on file to calculate BMI. Constitutional Well developed. Well nourished.  Vascular Dorsalis pedis pulses palpable bilaterally. Posterior tibial pulses palpable bilaterally. Capillary refill normal to all digits.  No cyanosis or clubbing noted. Pedal hair growth normal.  Neurologic Normal speech. Oriented to person, place, and time. Epicritic sensation to light touch grossly present bilaterally.  Dermatologic Nails well groomed and normal in appearance. No open wounds. No skin lesions.  Orthopedic: Pes cavus foot structure noted with ATFL pain.  Pain in the lateral ankle.  No pain at the medial ankle no  pain at the Achilles tendon pain at the midfoot plantar fascia   Radiographs: 3 views of skeletally mature the right foot: Mild osteoarthritis noted of the ankle joint no other abnormalities noted pes cavus foot structure noted.  No osteochondral lesion Assessment:   1. Plantar fasciitis of left foot    Plan:  Patient was evaluated and treated and all questions answered.  Right chronic ankle instability with intermittent ATFL ligament pain with pes cavus foot structure - All questions and concerns were discussed with the patient in extensive detail I discussed with her cam boot immobilization versus ankle brace she would like to focus on mom bracing options for now. - I will discuss with her in extensive detail about padding opposed to the orthotic.  She agrees with the plan she is scheduled to see Trish to to pronate her foot  No follow-ups on file.

## 2024-02-25 ENCOUNTER — Ambulatory Visit

## 2024-02-25 ENCOUNTER — Ambulatory Visit: Attending: Rheumatology | Admitting: Rheumatology

## 2024-02-25 ENCOUNTER — Ambulatory Visit
Admission: RE | Admit: 2024-02-25 | Discharge: 2024-02-25 | Disposition: A | Discharge status: Home / Self Care | Source: Ambulatory Visit | Attending: Family Medicine | Admitting: Family Medicine

## 2024-02-25 ENCOUNTER — Other Ambulatory Visit: Payer: Self-pay | Admitting: Family Medicine

## 2024-02-25 ENCOUNTER — Other Ambulatory Visit

## 2024-02-25 ENCOUNTER — Encounter: Payer: Self-pay | Admitting: Rheumatology

## 2024-02-25 VITALS — BP 103/70 | HR 80 | Temp 98.4°F | Resp 17 | Ht 66.0 in | Wt 225.0 lb

## 2024-02-25 DIAGNOSIS — M79642 Pain in left hand: Secondary | ICD-10-CM

## 2024-02-25 DIAGNOSIS — Q6671 Congenital pes cavus, right foot: Secondary | ICD-10-CM

## 2024-02-25 DIAGNOSIS — R7689 Other specified abnormal immunological findings in serum: Secondary | ICD-10-CM | POA: Diagnosis not present

## 2024-02-25 DIAGNOSIS — M79672 Pain in left foot: Secondary | ICD-10-CM | POA: Diagnosis not present

## 2024-02-25 DIAGNOSIS — M2550 Pain in unspecified joint: Secondary | ICD-10-CM | POA: Diagnosis not present

## 2024-02-25 DIAGNOSIS — K760 Fatty (change of) liver, not elsewhere classified: Secondary | ICD-10-CM

## 2024-02-25 DIAGNOSIS — M1811 Unilateral primary osteoarthritis of first carpometacarpal joint, right hand: Secondary | ICD-10-CM | POA: Diagnosis not present

## 2024-02-25 DIAGNOSIS — G8929 Other chronic pain: Secondary | ICD-10-CM

## 2024-02-25 DIAGNOSIS — J4540 Moderate persistent asthma, uncomplicated: Secondary | ICD-10-CM

## 2024-02-25 DIAGNOSIS — M542 Cervicalgia: Secondary | ICD-10-CM

## 2024-02-25 DIAGNOSIS — M79641 Pain in right hand: Secondary | ICD-10-CM

## 2024-02-25 DIAGNOSIS — M545 Low back pain, unspecified: Secondary | ICD-10-CM

## 2024-02-25 DIAGNOSIS — R9389 Abnormal findings on diagnostic imaging of other specified body structures: Secondary | ICD-10-CM

## 2024-02-25 DIAGNOSIS — M7062 Trochanteric bursitis, left hip: Secondary | ICD-10-CM

## 2024-02-25 DIAGNOSIS — R599 Enlarged lymph nodes, unspecified: Secondary | ICD-10-CM

## 2024-02-25 DIAGNOSIS — M47816 Spondylosis without myelopathy or radiculopathy, lumbar region: Secondary | ICD-10-CM

## 2024-02-25 DIAGNOSIS — M7061 Trochanteric bursitis, right hip: Secondary | ICD-10-CM

## 2024-02-25 DIAGNOSIS — I480 Paroxysmal atrial fibrillation: Secondary | ICD-10-CM

## 2024-02-25 DIAGNOSIS — E559 Vitamin D deficiency, unspecified: Secondary | ICD-10-CM

## 2024-02-25 DIAGNOSIS — M2242 Chondromalacia patellae, left knee: Secondary | ICD-10-CM

## 2024-02-25 DIAGNOSIS — M79671 Pain in right foot: Secondary | ICD-10-CM

## 2024-02-25 DIAGNOSIS — Q6672 Congenital pes cavus, left foot: Secondary | ICD-10-CM

## 2024-02-25 DIAGNOSIS — R590 Localized enlarged lymph nodes: Secondary | ICD-10-CM | POA: Diagnosis not present

## 2024-02-25 DIAGNOSIS — J45909 Unspecified asthma, uncomplicated: Secondary | ICD-10-CM

## 2024-02-25 NOTE — Patient Instructions (Signed)
 Low Back Sprain or Strain Rehab Ask your health care provider which exercises are safe for you. Do exercises exactly as told by your health care provider and adjust them as directed. It is normal to feel mild stretching, pulling, tightness, or discomfort as you do these exercises. Stop right away if you feel sudden pain or your pain gets worse. Do not begin these exercises until told by your health care provider. Stretching and range-of-motion exercises These exercises warm up your muscles and joints and improve the movement and flexibility of your back. These exercises also help to relieve pain, numbness, and tingling. Lumbar rotation  Lie on your back on a firm bed or the floor with your knees bent. Straighten your arms out to your sides so each arm forms a 90-degree angle (right angle) with a side of your body. Slowly move (rotate) both of your knees to one side of your body until you feel a stretch in your lower back (lumbar). Try not to let your shoulders lift off the floor. Hold this position for __________ seconds. Tense your abdominal muscles and slowly move your knees back to the starting position. Repeat this exercise on the other side of your body. Repeat __________ times. Complete this exercise __________ times a day. Single knee to chest  Lie on your back on a firm bed or the floor with both legs straight. Bend one of your knees. Use your hands to move your knee up toward your chest until you feel a gentle stretch in your lower back and buttock. Hold your leg in this position by holding on to the front of your knee. Keep your other leg as straight as possible. Hold this position for __________ seconds. Slowly return to the starting position. Repeat with your other leg. Repeat __________ times. Complete this exercise __________ times a day. Prone extension on elbows  Lie on your abdomen on a firm bed or the floor (prone position). Prop yourself up on your elbows. Use your arms  to help lift your chest up until you feel a gentle stretch in your abdomen and your lower back. This will place some of your body weight on your elbows. If this is uncomfortable, try stacking pillows under your chest. Your hips should stay down, against the surface that you are lying on. Keep your hip and back muscles relaxed. Hold this position for __________ seconds. Slowly relax your upper body and return to the starting position. Repeat __________ times. Complete this exercise __________ times a day. Strengthening exercises These exercises build strength and endurance in your back. Endurance is the ability to use your muscles for a long time, even after they get tired. Pelvic tilt This exercise strengthens the muscles that lie deep in the abdomen. Lie on your back on a firm bed or the floor with your legs extended. Bend your knees so they are pointing toward the ceiling and your feet are flat on the floor. Tighten your lower abdominal muscles to press your lower back against the floor. This motion will tilt your pelvis so your tailbone points up toward the ceiling instead of pointing to your feet or the floor. To help with this exercise, you may place a small towel under your lower back and try to push your back into the towel. Hold this position for __________ seconds. Let your muscles relax completely before you repeat this exercise. Repeat __________ times. Complete this exercise __________ times a day. Alternating arm and leg raises  Get on your hands  and knees on a firm surface. If you are on a hard floor, you may want to use padding, such as an exercise mat, to cushion your knees. Line up your arms and legs. Your hands should be directly below your shoulders, and your knees should be directly below your hips. Lift your left leg behind you. At the same time, raise your right arm and straighten it in front of you. Do not lift your leg higher than your hip. Do not lift your arm higher  than your shoulder. Keep your abdominal and back muscles tight. Keep your hips facing the ground. Do not arch your back. Keep your balance carefully, and do not hold your breath. Hold this position for __________ seconds. Slowly return to the starting position. Repeat with your right leg and your left arm. Repeat __________ times. Complete this exercise __________ times a day. Abdominal set with straight leg raise  Lie on your back on a firm bed or the floor. Bend one of your knees and keep your other leg straight. Tense your abdominal muscles and lift your straight leg up, 4-6 inches (10-15 cm) off the ground. Keep your abdominal muscles tight and hold this position for __________ seconds. Do not hold your breath. Do not arch your back. Keep it flat against the ground. Keep your abdominal muscles tense as you slowly lower your leg back to the starting position. Repeat with your other leg. Repeat __________ times. Complete this exercise __________ times a day. Single leg lower with bent knees Lie on your back on a firm bed or the floor. Tense your abdominal muscles and lift your feet off the floor, one foot at a time, so your knees and hips are bent in 90-degree angles (right angles). Your knees should be over your hips and your lower legs should be parallel to the floor. Keeping your abdominal muscles tense and your knee bent, slowly lower one of your legs so your toe touches the ground. Lift your leg back up to return to the starting position. Do not hold your breath. Do not let your back arch. Keep your back flat against the ground. Repeat with your other leg. Repeat __________ times. Complete this exercise __________ times a day. Posture and body mechanics Good posture and healthy body mechanics can help to relieve stress in your body's tissues and joints. Body mechanics refers to the movements and positions of your body while you do your daily activities. Posture is part of body  mechanics. Good posture means: Your spine is in its natural S-curve position (neutral). Your shoulders are pulled back slightly. Your head is not tipped forward (neutral). Follow these guidelines to improve your posture and body mechanics in your everyday activities. Standing  When standing, keep your spine neutral and your feet about hip-width apart. Keep a slight bend in your knees. Your ears, shoulders, and hips should line up. When you do a task in which you stand in one place for a long time, place one foot up on a stable object that is 2-4 inches (5-10 cm) high, such as a footstool. This helps keep your spine neutral. Sitting  When sitting, keep your spine neutral and keep your feet flat on the floor. Use a footrest, if necessary, and keep your thighs parallel to the floor. Avoid rounding your shoulders, and avoid tilting your head forward. When working at a desk or a computer, keep your desk at a height where your hands are slightly lower than your elbows. Slide your  chair under your desk so you are close enough to maintain good posture. When working at a computer, place your monitor at a height where you are looking straight ahead and you do not have to tilt your head forward or downward to look at the screen. Resting When lying down and resting, avoid positions that are most painful for you. If you have pain with activities such as sitting, bending, stooping, or squatting, lie in a position in which your body does not bend very much. For example, avoid curling up on your side with your arms and knees near your chest (fetal position). If you have pain with activities such as standing for a long time or reaching with your arms, lie with your spine in a neutral position and bend your knees slightly. Try the following positions: Lying on your side with a pillow between your knees. Lying on your back with a pillow under your knees. Lifting  When lifting objects, keep your feet at least  shoulder-width apart and tighten your abdominal muscles. Bend your knees and hips and keep your spine neutral. It is important to lift using the strength of your legs, not your back. Do not lock your knees straight out. Always ask for help to lift heavy or awkward objects. This information is not intended to replace advice given to you by your health care provider. Make sure you discuss any questions you have with your health care provider. Document Revised: 08/17/2022 Document Reviewed: 07/01/2020 [employer] Patient Education  2024 [employer]Cervical Strain and Sprain Rehab Ask your health care provider which exercises are safe for you. Do exercises exactly as told by your health care provider and adjust them as directed. It is normal to feel mild stretching, pulling, tightness, or discomfort as you do these exercises. Stop right away if you feel sudden pain or your pain gets worse. Do not begin these exercises until told by your health care provider. Stretching and range-of-motion exercises Cervical side bending  Using good posture, sit on a stable chair or stand up. Without moving your shoulders, slowly tilt your left / right ear to your shoulder until you feel a stretch in the neck muscles on the opposite side. You should be looking straight ahead. Hold for __________ seconds. Repeat with the other side of your neck. Repeat __________ times. Complete this exercise __________ times a day. Cervical rotation  Using good posture, sit on a stable chair or stand up. Slowly turn your head to the side as if you are looking over your left / right shoulder. Keep your eyes level with the ground. Stop when you feel a stretch along the side and the back of your neck. Hold for __________ seconds. Repeat this by turning to your other side. Repeat __________ times. Complete this exercise __________ times a day. Thoracic extension and pectoral stretch  Roll a towel or a small blanket so it is about 4  inches (10 cm) in diameter. Lie down on your back on a firm surface. Put the towel in the middle of your back across your spine. It should not be under your shoulder blades. Put your hands behind your head and let your elbows fall out to your sides. Hold for __________ seconds. Repeat __________ times. Complete this exercise __________ times a day. Strengthening exercises Upper cervical flexion  Lie on your back with a thin pillow behind your head or a small, rolled-up towel under your neck. Gently tuck your chin toward your chest and nod your head  down to look toward your feet. Do not lift your head off the pillow. Hold for __________ seconds. Release the tension slowly. Relax your neck muscles completely before you repeat this exercise. Repeat __________ times. Complete this exercise __________ times a day. Cervical extension  Stand about 6 inches (15 cm) away from a wall, with your back facing the wall. Place a soft object, about 6-8 inches (15-20 cm) in diameter, between the back of your head and the wall. A soft object could be a small pillow, a ball, or a folded towel. Gently tilt your head back and press into the soft object. Keep your jaw and forehead relaxed. Hold for __________ seconds. Release the tension slowly. Relax your neck muscles completely before you repeat this exercise. Repeat __________ times. Complete this exercise __________ times a day. Posture and body mechanics Body mechanics refer to the movements and positions of your body while you do your daily activities. Posture is part of body mechanics. Good posture and healthy body mechanics can help to relieve stress in your body's tissues and joints. Good posture means that your spine is in its natural S-curve position (your spine is neutral), your shoulders are pulled back slightly, and your head is not tipped forward. The following are general guidelines for using improved posture and body mechanics in your everyday  activities. Sitting  When sitting, keep your spine neutral and keep your feet flat on the floor. Use a footrest, if needed, and keep your thighs parallel to the floor. Avoid rounding your shoulders. Avoid tilting your head forward. When working at a desk or a computer, keep your desk at a height where your hands are slightly lower than your elbows. Slide your chair under your desk so you are close enough to maintain good posture. When working at a computer, place your monitor at a height where you are looking straight ahead and you do not have to tilt your head forward or downward to look at the screen. Standing  When standing, keep your spine neutral and keep your feet about hip-width apart. Keep a slight bend in your knees. Your ears, shoulders, and hips should line up. When you do a task in which you stand in one place for a long time, place one foot up on a stable object that is 2-4 inches (5-10 cm) high, such as a footstool. This helps keep your spine neutral. Resting When lying down and resting, avoid positions that are most painful for you. Try to support your neck in a neutral position. You can use a contour pillow or a small rolled-up towel. Your pillow should support your neck but not push on it. This information is not intended to replace advice given to you by your health care provider. Make sure you discuss any questions you have with your health care provider. Document Revised: 08/17/2022 Document Reviewed: 11/03/2021 [employer] Patient Education  2024 [employer]Hand Exercises Hand exercises can be helpful for almost anyone. They can strengthen your hands and improve flexibility and movement. The exercises can also increase blood flow to the hands. These results can make your work and daily tasks easier for you. Hand exercises can be especially helpful for people who have joint pain from arthritis or nerve damage from using their hands over and over. These exercises can also help  people who injure a hand. Exercises Most of these hand exercises are gentle stretching and motion exercises. It is usually safe to do them often throughout the day. Warming up your  hands before exercise may help reduce stiffness. You can do this with gentle massage or by placing your hands in warm water for 10-15 minutes. It is normal to feel some stretching, pulling, tightness, or mild discomfort when you begin new exercises. In time, this will improve. Remember to always be careful and stop right away if you feel sudden, very bad pain or your pain gets worse. You want to get better and be safe. Ask your health care provider which exercises are safe for you. Do exercises exactly as told by your provider and adjust them as told. Do not begin these exercises until told by your provider. Knuckle bend or claw fist  Stand or sit with your arm, hand, and all five fingers pointed straight up. Make sure to keep your wrist straight. Gently bend your fingers down toward your palm until the tips of your fingers are touching your palm. Keep your big knuckle straight and only bend the small knuckles in your fingers. Hold this position for 10 seconds. Straighten your fingers back to your starting position. Repeat this exercise 5-10 times with each hand. Full finger fist  Stand or sit with your arm, hand, and all five fingers pointed straight up. Make sure to keep your wrist straight. Gently bend your fingers into your palm until the tips of your fingers are touching the middle of your palm. Hold this position for 10 seconds. Extend your fingers back to your starting position, stretching every joint fully. Repeat this exercise 5-10 times with each hand. Straight fist  Stand or sit with your arm, hand, and all five fingers pointed straight up. Make sure to keep your wrist straight. Gently bend your fingers at the big knuckle, where your fingers meet your hand, and at the middle knuckle. Keep the knuckle at  the tips of your fingers straight and try to touch the bottom of your palm. Hold this position for 10 seconds. Extend your fingers back to your starting position, stretching every joint fully. Repeat this exercise 5-10 times with each hand. Tabletop  Stand or sit with your arm, hand, and all five fingers pointed straight up. Make sure to keep your wrist straight. Gently bend your fingers at the big knuckle, where your fingers meet your hand, as far down as you can. Keep the small knuckles in your fingers straight. Think of forming a tabletop with your fingers. Hold this position for 10 seconds. Extend your fingers back to your starting position, stretching every joint fully. Repeat this exercise 5-10 times with each hand. Finger spread  Place your hand flat on a table with your palm facing down. Make sure your wrist stays straight. Spread your fingers and thumb apart from each other as far as you can until you feel a gentle stretch. Hold this position for 10 seconds. Bring your fingers and thumb tight together again. Hold this position for 10 seconds. Repeat this exercise 5-10 times with each hand. Making circles  Stand or sit with your arm, hand, and all five fingers pointed straight up. Make sure to keep your wrist straight. Make a circle by touching the tip of your thumb to the tip of your index finger. Hold for 10 seconds. Then open your hand wide. Repeat this motion with your thumb and each of your fingers. Repeat this exercise 5-10 times with each hand. Thumb motion  Sit with your forearm resting on a table and your wrist straight. Your thumb should be facing up toward the ceiling. Keep your  fingers relaxed as you move your thumb. Lift your thumb up as high as you can toward the ceiling. Hold for 10 seconds. Bend your thumb across your palm as far as you can, reaching the tip of your thumb for the small finger (pinkie) side of your palm. Hold for 10 seconds. Repeat this exercise  5-10 times with each hand. Grip strengthening  Hold a stress ball or other soft ball in the middle of your hand. Slowly increase the pressure, squeezing the ball as much as you can without causing pain. Think of bringing the tips of your fingers into the middle of your palm. All of your finger joints should bend when doing this exercise. Hold your squeeze for 10 seconds, then relax. Repeat this exercise 5-10 times with each hand. Contact a health care provider if: Your hand pain or discomfort gets much worse when you do an exercise. Your hand pain or discomfort does not improve within 2 hours after you exercise. If you have either of these problems, stop doing these exercises right away. Do not do them again unless your provider says that you can. Get help right away if: You develop sudden, severe hand pain or swelling. If this happens, stop doing these exercises right away. Do not do them again unless your provider says that you can. This information is not intended to replace advice given to you by your health care provider. Make sure you discuss any questions you have with your health care provider. Document Revised: 04/28/2022 Document Reviewed: 04/28/2022 Else[employer] Patient Education  2024 [employer]Osteoarthritis  Osteoarthritis is a type of arthritis. It refers to joint pain or joint disease. Osteoarthritis affects tissue that covers the ends of bones in joints (cartilage). Cartilage acts as a cushion between the bones and helps them move smoothly. Osteoarthritis occurs when cartilage in the joints gets worn down. Osteoarthritis is sometimes called wear and tear arthritis. Osteoarthritis is the most common form of arthritis. It often occurs in older people. It is a condition that gets worse over time. The joints most often affected by this condition are in the fingers, toes, hips, knees, and spine, including the neck and lower back. What are the causes? This condition is caused by the  wearing down of cartilage that covers the ends of bones. What increases the risk? The following factors may make you more likely to develop this condition: Being age 37 or older. Obesity. Overuse of joints. Past injury of a joint. Past surgery on a joint. Family history of osteoarthritis. What are the signs or symptoms? The main symptoms of this condition are pain, swelling, and stiffness in the joint. Other symptoms may include: An enlarged joint. More pain and further damage caused by small pieces of bone or cartilage that break off and float inside of the joint. Small deposits of bone (osteophytes) that grow on the edges of the joint. A grating or scraping feeling inside the joint when you move it. Popping or creaking sounds when you move. Difficulty walking or exercising. An inability to grip items, twist your hand, or control the movements of your hands and fingers. How is this diagnosed? This condition may be diagnosed based on: Your medical history. A physical exam. Your symptoms. X-rays of the affected joints. Blood tests to rule out other types of arthritis. How is this treated? There is no cure for this condition, but treatment can help control pain and improve joint function. Treatment may include a combination of therapies, such as: Pain relief  techniques, such as: Applying heat and cold to the joint. Massage. A form of talk therapy called cognitive behavioral therapy (CBT). This therapy helps you set goals and follow up on the changes that you make. Medicines for pain and inflammation. The medicines can be taken by mouth or applied to the skin. They include: NSAIDs, such as ibuprofen . Prescription medicines. Strong anti-inflammatory medicines (corticosteroids). Certain nutritional supplements. A prescribed exercise program. You may work with a physical therapist. Assistive devices, such as a brace, wrap, splint, specialized glove, or cane. A weight control  plan. Surgery, such as: An osteotomy. This is done to reposition the bones and relieve pain or to remove loose pieces of bone and cartilage. Joint replacement surgery. You may need this surgery if you have advanced osteoarthritis. Follow these instructions at home: Activity Rest your affected joints as told by your health care provider. Exercise as told by your provider. The provider may recommend specific types of exercise, such as: Strengthening exercises. These are done to strengthen the muscles that support joints affected by arthritis. Aerobic activities. These are exercises, such as brisk walking or water aerobics, that increase your heart rate. Range-of-motion activities. These help your joints move more easily. Balance and agility exercises. Managing pain, stiffness, and swelling     If told, apply heat to the affected area as often as told by your provider. Use the heat source that your provider recommends, such as a moist heat pack or a heating pad. If you have a removable assistive device, remove it as told by your provider. Place a towel between your skin and the heat source. If your provider tells you to keep the assistive device on while you apply heat, place a towel between the assistive device and the heat source. Leave the heat on for 20-30 minutes. If told, put ice on the affected area. If you have a removable assistive device, remove it as told by your provider. Put ice in a plastic bag. Place a towel between your skin and the bag. If your provider tells you to keep the assistive device on during icing, place a towel between the assistive device and the bag. Leave the ice on for 20 minutes, 2-3 times a day. If your skin turns bright red, remove the ice or heat right away to prevent skin damage. The risk of damage is higher if you cannot feel pain, heat, or cold. Move your fingers or toes often to reduce stiffness and swelling. Raise (elevate) the affected area above the  level of your heart while you are sitting or lying down. General instructions Take over-the-counter and prescription medicines only as told by your provider. Maintain a healthy weight. Follow instructions from your provider for weight control. Do not use any products that contain nicotine or tobacco. These products include cigarettes, chewing tobacco, and vaping devices, such as e-cigarettes. If you need help quitting, ask your provider. Use assistive devices as told by your provider. Where to find more information [employer]thritis and Musculoskeletal and Skin Diseases: niams.http://www.myers.net/ [employer] on Aging: baseringtones.pl [employer]umatology: rheumatology.org Contact a health care provider if: You have redness, swelling, or a feeling of warmth in a joint that gets worse. You have a fever along with joint or muscle aches. You develop a rash. You have trouble doing your normal activities. You have pain that gets worse and is not relieved by pain medicine. This information is not intended to replace advice given to you by your health care  provider. Make sure you discuss any questions you have with your health care provider. Document Revised: 12/11/2021 Document Reviewed: 12/11/2021 Elsevier Patient Education  2024 Elsevier Inc.  Vaccines You are taking a medication(s) that can suppress your immune system.  The following immunizations are recommended: Flu annually RSV Covid-19  Td/Tdap (tetanus, diphtheria, pertussis) every 10 years Pneumonia (Prevnar 15 then Pneumovax 23 at least 1 year apart.  Alternatively, can take Prevnar 20 without needing additional dose) Shingrix: 2 doses from 4 weeks to 6 months apart  Please check with your PCP to make sure you are up to date.

## 2024-02-26 HISTORY — PX: AXILLARY LYMPH NODE BIOPSY: SHX5737

## 2024-02-29 ENCOUNTER — Ambulatory Visit
Admission: RE | Admit: 2024-02-29 | Discharge: 2024-02-29 | Disposition: A | Source: Ambulatory Visit | Attending: Family Medicine | Admitting: Family Medicine

## 2024-02-29 ENCOUNTER — Ambulatory Visit
Admission: RE | Admit: 2024-02-29 | Discharge: 2024-02-29 | Disposition: A | Discharge status: Home / Self Care | Source: Ambulatory Visit | Attending: Family Medicine | Admitting: Family Medicine

## 2024-02-29 ENCOUNTER — Other Ambulatory Visit (HOSPITAL_COMMUNITY)
Admission: RE | Admit: 2024-02-29 | Discharge: 2024-02-29 | Disposition: A | Discharge status: Home / Self Care | Source: Ambulatory Visit | Attending: Family Medicine | Admitting: Family Medicine

## 2024-02-29 DIAGNOSIS — R599 Enlarged lymph nodes, unspecified: Secondary | ICD-10-CM

## 2024-02-29 DIAGNOSIS — R590 Localized enlarged lymph nodes: Secondary | ICD-10-CM | POA: Diagnosis not present

## 2024-03-01 LAB — CYTOLOGY - NON PAP

## 2024-03-02 LAB — SURGICAL PATHOLOGY

## 2024-03-03 ENCOUNTER — Other Ambulatory Visit

## 2024-03-03 ENCOUNTER — Telehealth: Payer: Self-pay | Admitting: Rheumatology

## 2024-03-03 ENCOUNTER — Other Ambulatory Visit: Payer: Self-pay | Admitting: *Deleted

## 2024-03-03 ENCOUNTER — Telehealth: Payer: Self-pay | Admitting: Cardiovascular Disease

## 2024-03-03 DIAGNOSIS — Z1159 Encounter for screening for other viral diseases: Secondary | ICD-10-CM | POA: Diagnosis not present

## 2024-03-03 DIAGNOSIS — Z111 Encounter for screening for respiratory tuberculosis: Secondary | ICD-10-CM | POA: Diagnosis not present

## 2024-03-03 DIAGNOSIS — R7689 Other specified abnormal immunological findings in serum: Secondary | ICD-10-CM

## 2024-03-03 NOTE — Telephone Encounter (Signed)
 Cardiovascular: QT prolongation, ventricular arrhythmias, and torsades de points have been reported; monitoring recommended and discontinue use if suspected. Correct electrolyte imbalances prior to treatment [6].  Cardiovascular: Avoid use in patients with congenital or documented acquired QT prolongation and known risk factors for QT prolongation including cardiac disease (eg, heart failure, myocardial infarction), proarrhythmic conditions (eg, bradycardia [less than 50 beats per minute]), history of ventricular dysrhythmias, uncorrected hypokalemia and/or hypomagnesemia, and concomitant administration with QT interval prolonging drugs  Recommend EKG if patient starts medication

## 2024-03-03 NOTE — Progress Notes (Signed)
 Office Visit Note  Patient: Jade [name]             Date of Birth: Aug 02, 1960           MRN: [id number]             PCP: Catherine Charlies LABOR, DO Referring: Catherine Charlies LABOR, DO Visit Date: 03/17/2024 Occupation: Dental Hygienist  Subjective:  Discussed lab and x-ray results  History of Present Illness: Jade [name] is a 63 y.o. female with positive ANA, fatigue, arthralgias and sicca symptoms.  She was accompanied by her husband Jade [name] today.  She complains of dry eyes.  She denies any history of dry mouth.  She continues to have some discomfort in her both hands, both hips, feet, neck and lower back.  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    Activities of Daily Living:  Patient reports morning stiffness for 30 minutes.   Patient Reports nocturnal pain.  Difficulty dressing/grooming: Reports Difficulty climbing stairs: Reports Difficulty getting out of chair: Reports Difficulty using hands for taps, buttons, cutlery, and/or writing: Reports  Review of Systems  Constitutional:  Positive for fatigue.  HENT:  Positive for mouth dryness. Negative for mouth sores.   Eyes:  Positive for dryness.  Respiratory:  Positive for shortness of breath.   Cardiovascular:  Positive for chest pain and palpitations.  Gastrointestinal:  Positive for constipation and diarrhea. Negative for blood in stool.  Endocrine: Negative for increased urination.  Genitourinary:  Negative for involuntary urination.  Musculoskeletal:  Positive for joint pain, joint pain, muscle weakness and morning stiffness. Negative for gait problem, joint swelling, myalgias, muscle tenderness and myalgias.  Skin:  Negative for color change, rash, hair loss and sensitivity to sunlight.  Allergic/Immunologic: Positive for susceptible to infections.  Neurological:  Negative for dizziness and headaches.  Hematological:  Negative for swollen glands.  Psychiatric/Behavioral:  Positive for sleep  disturbance. Negative for depressed mood. The patient is not nervous/anxious.     PMFS History:  Patient Active Problem List   Diagnosis Date Noted   Polyarthralgia 08/30/2023   Positive ANA (antinuclear antibody) 08/30/2023   Ds DNA antibody positive 08/30/2023   Calf tenderness 08/27/2023   Paroxysmal atrial fibrillation (HCC)-status post ablation 04/30/2023   Pericarditis 04/22/2023   Hepatic steatosis 04/16/2023   Chondromalacia of left patella 09/08/2021   DDD (degenerative disc disease), lumbar 04/07/2019   Nonallopathic lesion of lumbosacral region 04/07/2019   Nonallopathic lesion of sacral region 04/07/2019   Inflammation of sacroiliac joint 11/25/2018   Lumbar spondylosis 08/30/2018   Arthritis of carpometacarpal [facility]) joint of right thumb 12/24/2017   Nonallopathic lesion of cervical region 12/24/2017   Nonallopathic lesion of thoracic region 12/24/2017   Nonallopathic lesion of rib cage 12/24/2017   Obesity (BMI 30-39.9) 05/14/2017   H/O vitamin D  deficiency 01/24/2016   Menopausal symptoms 12/02/2012   Asthma    HSV-2 (herpes simplex virus 2) infection     Past Medical History:  Diagnosis Date   Anxiety    Asthma    uses inhaler prn   Chest pain    Colon polyps    Diarrhea    with constipation while taking diet pills (Alli)   History of rectal bleeding     2 times in past   HSV-2 (herpes simplex virus 2) infection    Palpitations    in past/due to Advil  pm   Vitamin D  deficiency     Family History  Problem Relation  Age of Onset   Asthma Mother    Diabetes Father    Hypertension Father    Heart disease Father    Prostate cancer Father    Thyroid  disease Sister    Atrial fibrillation Sister    Atrial fibrillation Brother    Diabetes Paternal Uncle    Lung cancer Paternal Grandfather    Healthy Daughter    Colon cancer Neg Hx    Colon polyps Neg Hx    Rectal cancer Neg Hx    Stomach cancer Neg Hx    Esophageal cancer Neg Hx    Past Surgical  History:  Procedure Laterality Date   ABDOMINAL HYSTERECTOMY  1998   fibroids. partial   ATRIAL FIBRILLATION ABLATION N/A 04/20/2023   Procedure: ATRIAL FIBRILLATION ABLATION;  Surgeon: Kennyth Chew, MD;  Location: [facility] INVASIVE CV LAB;  Service: Cardiovascular;  Laterality: N/A;   AXILLARY LYMPH NODE BIOPSY Left 02/2024   CATARACT EXTRACTION Bilateral    2020, 2025   CESAREAN SECTION     1 time   COLONOSCOPY W/ POLYPECTOMY  2017   RESECTOSCOPIC MYOMECTOMY     Social History   Tobacco Use   Smoking status: Never    Passive exposure: Past   Smokeless tobacco: Never   Tobacco comments:    Never smoked 04/30/23  Vaping Use   Vaping status: Never Used  Substance Use Topics   Alcohol use: Yes    Alcohol/week: 1.0 - 2.0 standard drink of alcohol    Types: 1 - 2 Glasses of wine per week    Comment: 1-2 glasses of wine occ. 04/30/23   Drug use: No   Social History   Social History Narrative   Married. One child.   College-educated, works as a armed forces operational officer.   Smoke alarm in the home. Wears her seatbelt.   Feels safe in her relationship.     Immunization History  Administered Date(s) Administered   Hepb-cpg 09/19/2021, 10/24/2021   Influenza,inj,Quad PF,6+ Mos 03/31/2019   Influenza-Unspecified 01/25/2017, 01/25/2021, 01/09/2023   Moderna Sars-Covid-2 Vaccination 04/18/2019, 05/12/2019   PNEUMOCOCCAL CONJUGATE-20 10/15/2023   Tdap 09/13/2020   Zoster Recombinant(Shingrix) 09/13/2020, 12/20/2020     Objective: Vital Signs: BP 114/78   Pulse 85   Temp 98.7 F (37.1 C)   Resp 16   Ht 5' 6 (1.676 m)   Wt 223 lb 9.6 oz (101.4 kg)   BMI 36.09 kg/m    Physical Exam Vitals and nursing note reviewed.  Constitutional:      Appearance: She is well-developed.  HENT:     Head: Normocephalic and atraumatic.  Eyes:     Conjunctiva/sclera: Conjunctivae normal.  Cardiovascular:     Rate and Rhythm: Normal rate and regular rhythm.     Heart sounds: Normal heart  sounds.  Pulmonary:     Effort: Pulmonary effort is normal.     Breath sounds: Normal breath sounds.  Abdominal:     General: Bowel sounds are normal.     Palpations: Abdomen is soft.  Musculoskeletal:     Cervical back: Normal range of motion.  Lymphadenopathy:     Cervical: No cervical adenopathy.  Skin:    General: Skin is warm and dry.     Capillary Refill: Capillary refill takes less than 2 seconds.  Neurological:     Mental Status: She is alert and oriented to person, place, and time.  Psychiatric:        Behavior: Behavior normal.      Musculoskeletal  Exam: Cervical, thoracic and lumbar spine were in good range of motion.  She had discomfort range of motion of the cervical and lumbar spine.  There was no SI joint tenderness.  Shoulder joints, elbow joints, wrist joints, MCPs, PIPs and DIPs were in good range of motion with no synovitis.  She had bilateral CMC, PIP and DIP thickening with no synovitis.  Hip joints and knee joints were in good range of motion without any warmth swelling or effusion.  Bilateral pes cavus and dorsal spurs were noted.  There was no tenderness over ankles or MTPs.   CDAI Exam: CDAI Score: -- Patient Global: --; Provider Global: -- Swollen: --; Tender: -- Joint Exam 03/17/2024   No joint exam has been documented for this visit   There is currently no information documented on the homunculus. Go to the Rheumatology activity and complete the homunculus joint exam.  Investigation: No additional findings.  Imaging: US  AXILLARY NODE CORE BIOPSY LEFT Addendum Date: 03/03/2024 ADDENDUM REPORT: 03/03/2024 08:04 ADDENDUM: PATHOLOGY revealed: Lymph node, needle/core biopsy, left axilla, spiral hydromark clip- BENIGN LYMPH NODE WITH REACTIVE FEATURES Pathology results are CONCORDANT with imaging findings, per Dr. Curtistine Noble. Pathology results and recommendations were discussed with patient via telephone on 03/01/2024 by Mliss Molt RN. Patient  reported biopsy site doing well with no adverse symptoms, and only slight tenderness at the site. Post biopsy care instructions were reviewed, questions were answered and my direct phone number was provided. Patient was instructed to call [facility] for any additional questions or concerns related to biopsy site. RECOMMENDATION: Repeat left axilla ultrasound to ensure stability at patient's next bilateral annual mammogram, April 2026. Patient informed a reminder notice will be sent regarding this appointment and she may need to call mammography site to schedule this appointment. Pathology results reported by Mliss Molt RN 03/02/2024. Electronically Signed   By: Curtistine Noble   On: 03/03/2024 08:04   Result Date: 03/03/2024 CLINICAL DATA:  63 year old female here for left axillary lymph node biopsy. EXAM: US  AXILLARY NODE CORE BIOPSY LEFT COMPARISON:  Previous exam(s). PROCEDURE: I met with the patient and we discussed the procedure of ultrasound-guided biopsy, including benefits and alternatives. We discussed the risks of the procedure, including infection, bleeding, tissue injury, clip migration, and inadequate sampling. Informed written consent was given. The usual time-out protocol was performed immediately prior to the procedure. Using sterile technique and 1% Lidocaine  with and without epinephrine as local anesthetic, under direct ultrasound visualization, a 14 gauge spring-loaded device was used to perform biopsy of a single enlarged lymph node using a lateral to medial approach. At the conclusion of the procedure a spiral HydroMARK tissue marker clip was deployed into the biopsy cavity. Follow up 2 view mammogram was performed and dictated separately. IMPRESSION: Ultrasound guided biopsy of the left axilla as above. No apparent complications. Electronically Signed: By: Curtistine Noble On: 02/29/2024 08:14   MM CLIP PLACEMENT LEFT Result Date: 02/29/2024 CLINICAL DATA:   63 year old female status post ultrasound-guided left axillary lymph node biopsy. EXAM: 3D DIAGNOSTIC LEFT MAMMOGRAM POST ULTRASOUND BIOPSY COMPARISON:  Previous exam(s). ACR Breast Density Category b: There are scattered areas of fibroglandular density. FINDINGS: 3D Mammographic images were obtained following ultrasound guided biopsy of the left axilla. The biopsy marking clip is in expected position at the site of biopsy. IMPRESSION: Appropriate positioning of the spiral hydromark shaped biopsy marking clip at the site of biopsy in the left axilla. Final Assessment: Post Procedure  Mammograms for Marker Placement Electronically Signed   By: Curtistine Noble   On: 02/29/2024 10:29   XR Cervical Spine 2 or 3 views Result Date: 02/25/2024 No significant disc space narrowing was noted.  Facet joint arthropathy was noted.  Anterior osteophytes were noted. Impression: These findings are suggestive of degenerative changes and facet joint arthropathy.  XR Lumbar Spine 2-3 Views Result Date: 02/25/2024 No significant disc space narrowing was noted.  Facet joint narrowing was noted.  No SI joint sclerosis or narrowing was noted. Impression: These findings are suggestive of facet joint arthropathy of the lumbar spine.  XR Foot 2 Views Left Result Date: 02/25/2024 First MTP, PIP and DIP narrowing was noted.  No intertarsal, tibiotalar or subtalar joint space narrowing was noted.  No erosive changes were noted. Impression: These findings is history of osteoarthritis of the foot.  XR Foot 2 Views Right Result Date: 02/25/2024 First MTP, PIP and DIP narrowing was noted.  No intertarsal, tibiotalar or subtalar joint space narrowing was noted.  No erosive changes were noted. Impression: These findings is history of osteoarthritis of the foot.  XR Hand 2 View Left Result Date: 02/25/2024 Severe CMC narrowing, PIP and DIP narrowing was noted.  No MCP, intercarpal or radiocarpal joint space narrowing was noted.  No  erosive changes were noted. Impression: These findings are suggestive of osteoarthritis of the hand.  XR Hand 2 View Right Result Date: 02/25/2024 Severe CMC narrowing, PIP and DIP narrowing was noted.  No MCP, intercarpal or radiocarpal joint space narrowing was noted.  No erosive changes were noted. Impression: These findings are suggestive of osteoarthritis of the hand.  US  AXILLA LEFT Result Date: 02/25/2024 CLINICAL DATA:  Six-month follow-up for probably benign LEFT axillary lymph node EXAM: ULTRASOUND OF THE LEFT AXILLA COMPARISON:  Previous examinations FINDINGS: Sonographic evaluation of the LEFT axilla was performed. Interval enlargement of previously seen LEFT axillary lymph node which now measures 1.6 x 0.7 x 1.0 cm compared to 1.1 x 0.5 x 1.1 cm on the prior ultrasound. An additional abnormally enlarged lymph node is now also seen within the LEFT axilla measuring 2.3 x 1.0 x 1.3 cm. IMPRESSION: Interval increase in size and number of abnormally enlarged LEFT axillary lymph nodes. RECOMMENDATION: Ultrasound-guided core needle biopsy of enlarged LEFT axillary lymph node. I have discussed the findings and recommendations with the patient. The biopsy procedure was explained to the patient and questions were answered. Patient expressed their understanding of the biopsy recommendation. Patient will be scheduled for biopsy at her earliest convenience by the schedulers. Ordering provider will be notified. If applicable, a reminder letter will be sent to the patient regarding the next appointment. BI-RADS CATEGORY  4: Suspicious. Electronically Signed   By: Aliene Lloyd M.D.   On: 02/25/2024 13:13   DG Ankle Complete Left Result Date: 02/18/2024 Please see detailed radiograph report in office note.   Recent Labs: Lab Results  Component Value Date   WBC 7.9 01/28/2024   HGB 13.1 01/28/2024   PLT 227.0 01/28/2024   NA 139 01/28/2024   K 3.9 01/28/2024   CL 104 01/28/2024   CO2 27 01/28/2024    GLUCOSE 109 (H) 01/28/2024   BUN 15 01/28/2024   CREATININE 0.72 01/28/2024   BILITOT 0.4 01/28/2024   ALKPHOS 56 01/28/2024   AST 17 01/28/2024   ALT 13 01/28/2024   PROT 7.8 03/03/2024   ALBUMIN 4.2 01/28/2024   CALCIUM 9.3 01/28/2024   QFTBGOLDPLUS NEGATIVE 03/03/2024   March 03, 2024 SPEP normal, G6PD normal, TPMT normal, hepatitis B and hepatitis C nonreactive, TB Gold negative, urine protein creatinine ratio normal, anticardiolipin antibody negative, beta-2  GP 1 antibody negative, lupus anticoagulant negative, C3-C4 normal, ANA 1: 40 NS, dsDNA 34, SSA 5.4  Aug 27, 2023 ANA 1: 80 NS, dsDNA 25, SSA 2.9, SSB negative, Smith negative, RNP negative, SCL 70 negative, RF negative, anti-CCP negative, sed rate 23 October 15, 2023 LDL 127, B12 357, folate 11.7, hemoglobin A1c 5.9, TSH 2.14, vitamin D24.25 January 28, 2024 CRP<0.5  Speciality Comments: No specialty comments available.  Procedures:  No procedures performed Allergies: Ciprofloxacin, Eliquis  [apixaban ], Sulfa antibiotics, Cat dander, and Dog epithelium (canis lupus familiaris)   Assessment / Plan:     Visit Diagnoses: Sjogren's syndrome with other organ involvement - Positive ANA, double-stranded DNA, SSA, arthralgias. -She complains of fatigue and dry eyes.  She has aches and pains in her joints but no joint swelling.  I did detailed discussion with the patient and her husband.  March 03, 2024 SPEP normal, G6PD normal, TPMT normal, hepatitis B and hepatitis C nonreactive, TB Gold negative, urine protein creatinine ratio normal, anticardiolipin antibody negative, beta-2  GP 1 antibody negative, lupus anticoagulant negative, C3-C4 normal, ANA 1: 40 NS, dsDNA 34, SSA 5.4.  She continues to have positive low titer ANA, double-stranded DNA and SSA antibody.  We discussed hydroxychloroquine use at the last visit to see if her symptoms of fatigue will improve.  Patient decided against it as she is concerned about the prolonged QT  interval.  She is currently not very symptomatic and would like to hold off medication.  We decided to repeat labs in 6 months.  I also advised her to contact me if she develops any new symptoms.  She denies any history of oral ulcers, dry mouth, malar rash, Raynaud's phenomenon or lymphadenopathy.  There is no history of inflammatory arthritis.  Plan: Protein / creatinine ratio, urine, CBC with Differential/Platelet, Comprehensive metabolic panel with GFR, ANA, Anti-DNA antibody, double-stranded, C3 and C4, Sedimentation rate, Sjogrens syndrome-A extractable nuclear antibody.  Will repeat labs in 6 months.  I advised her to contact me if she develops any new symptoms.  Over-the-counter products for dry eyes were discussed.  Association of SSA antibodies with lymphoma, ILD, arrhythmia and inflammatory arthritis were discussed.  Polyarthralgia - Pain in joints over the last 5 years including neck, lower back, hands hips, knees and feet  Arthritis of carpometacarpal (CMC) joint of right thumb-she has chronic discomfort.  Pain in both hands - Clinical findings suggestive of osteoarthritis.  X-rays obtained at the last visit were all suggestive of osteoarthritis.  X-ray findings were reviewed with the patient.  A handout on hand exercises was given.  Trochanteric bursitis of both hips -a handout on IT band stretches was given at the last visit.  Chondromalacia of left patella -chronic discomfort.  History of arthroscopic surgery in the past  Pain in both feet - Clinical findings suggestive of osteoarthritis.  History of plantar fasciitis in the past.  X-rays obtained at the last visit were suggestive of osteoarthritis.  X-ray findings were reviewed with the patient.  Pes cavus of both feet - Bilateral pes cavus and dorsal spurs were noted.  Proper fitting shoes were advised.  DDD (degenerative disc disease), cervical - X-rays obtained at the last visit showed multilevel disc disease with anterior  osteophytes and facet joint arthropathy.  X-ray findings were reviewed with the patient.  I offered physical therapy but she  would like to try exercises first.  A handout on hand exercises was given.  Arthropathy of lumbar facet joint - X-rays obtained at the last visit showed lumbar facet joint arthropathy.  X-rays were reviewed with the patient and her husband.  A handout on back exercises was given.  I also offered physical therapy.  Patient will contact us  if she wants physical therapy.  Other medical problems are listed as follows:  Paroxysmal atrial fibrillation (HCC)-status post ablation - History of post ablation pericarditis.  Hepatic steatosis  Moderate asthma, unspecified whether complicated, unspecified whether persistent  Vitamin D  deficiency  Orders: Orders Placed This Encounter  Procedures   Protein / creatinine ratio, urine   CBC with Differential/Platelet   Comprehensive metabolic panel with GFR   ANA   Anti-DNA antibody, double-stranded   C3 and C4   Sedimentation rate   Sjogrens syndrome-A extractable nuclear antibody   No orders of the defined types were placed in this encounter.   Face-to-face time spent with patient was over 40 minutes. Greater than 50% of time was spent in counseling and coordination of care.  Follow-Up Instructions: Return in about 6 months (around 09/14/2024) for +ANA.   Maya Nash, MD  Note - This record has been created using Animal nutritionist.  Chart creation errors have been sought, but may not always  have been located. Such creation errors do not reflect on  the standard of medical care.

## 2024-03-03 NOTE — Telephone Encounter (Signed)
 FYI:  Patient states the results from her lymph node biopsy were benign and wanted Dr. Dolphus to know.

## 2024-03-03 NOTE — Telephone Encounter (Signed)
 Pt c/o medication issue:  1. Name of Medication: Hydroxychloroquine  2. How are you currently taking this medication (dosage and times per day)?   Not started as yet  3. Are you having a reaction (difficulty breathing--STAT)?   4. What is your medication issue?   Patient stated she has been diagnosed with Systemic Lupus and may be prescribed this medication.  Patient wants to know if this is safe for her to take with her heart medications.

## 2024-03-03 NOTE — Telephone Encounter (Signed)
 Spoke to patient she wanted Charlies Arthur PA's advice if ok for her to start Hydroxychloroquine.Stated she will be checking  with her Rheumatologist for a alternative drug.

## 2024-03-07 ENCOUNTER — Other Ambulatory Visit

## 2024-03-07 DIAGNOSIS — J029 Acute pharyngitis, unspecified: Secondary | ICD-10-CM | POA: Diagnosis not present

## 2024-03-07 DIAGNOSIS — H6691 Otitis media, unspecified, right ear: Secondary | ICD-10-CM | POA: Diagnosis not present

## 2024-03-07 DIAGNOSIS — H9201 Otalgia, right ear: Secondary | ICD-10-CM | POA: Diagnosis not present

## 2024-03-07 NOTE — Telephone Encounter (Signed)
 Spoke to patient she stated she may decide to take a different medication, but if she decides to take Hydroxychloroquine when will she need to have a EKG.Message sent to Pharm D for advice.

## 2024-03-08 ENCOUNTER — Ambulatory Visit: Payer: Self-pay | Admitting: Rheumatology

## 2024-03-08 NOTE — Progress Notes (Signed)
 Double-stranded DNA positive SSA positive, ANA positive hepatitis B and hepatitis C nonreactive, TB Gold negative, SPEP normal,Urine protein creatinine ratio normal, anticardiolipin antibody negative, beta-2 GP 1 negative, lupus anticoagulant pending, G6PD and TPMT pending.  I will discuss results at the follow-up visit.

## 2024-03-09 NOTE — Telephone Encounter (Addendum)
 Spoke to patient Dr.O'Neal advised if you start Hydroxychlorquine call office to schedule nurse visit for EKG within 1 week after starting.

## 2024-03-11 LAB — BETA-2 GLYCOPROTEIN ANTIBODIES
Beta-2 Glyco 1 IgA: 2.1 U/mL (ref <20.0)
Beta-2 Glyco 1 IgM: <2 U/mL (ref <20.0)
Beta-2 Glyco I IgG: <2 U/mL (ref <20.0)

## 2024-03-11 LAB — PROTEIN / CREATININE RATIO, URINE
Creatinine, Urine: 145 mg/dL (ref 20–275)
Protein/Creat Ratio: 41 mg/g{creat} (ref 24–184)
Protein/Creatinine Ratio: 0.041 mg/mg{creat} (ref 0.024–0.184)
Total Protein, Urine: 6 mg/dL (ref 5–24)

## 2024-03-11 LAB — PROTEIN ELECTROPHORESIS, SERUM, WITH REFLEX
Albumin ELP: 4.5 g/dL (ref 3.8–4.8)
Alpha 1: 0.3 g/dL (ref 0.2–0.3)
Alpha 2: 0.8 g/dL (ref 0.5–0.9)
Beta 2: 0.5 g/dL (ref 0.2–0.5)
Beta Globulin: 0.5 g/dL (ref 0.4–0.6)
Gamma Globulin: 1.2 g/dL (ref 0.8–1.7)
Total Protein: 7.8 g/dL (ref 6.1–8.1)

## 2024-03-11 LAB — LUPUS ANTICOAGULANT EVAL W/ REFLEX
PTT-LA Screen: 39 s (ref <=40)
dRVVT: 35 s (ref <=45)

## 2024-03-11 LAB — QUANTIFERON-TB GOLD PLUS
Mitogen-NIL: >10 [IU]/mL
NIL: 0.05 [IU]/mL
QuantiFERON-TB Gold Plus: NEGATIVE
TB1-NIL: 0 [IU]/mL
TB2-NIL: 0 [IU]/mL

## 2024-03-11 LAB — HEPATITIS B CORE ANTIBODY, IGM: Hep B C IgM: NONREACTIVE

## 2024-03-11 LAB — ANA: Anti Nuclear Antibody (ANA): POSITIVE — AB

## 2024-03-11 LAB — C3 AND C4
C3 Complement: 153 mg/dL (ref 83–193)
C4 Complement: 33 mg/dL (ref 15–57)

## 2024-03-11 LAB — CARDIOLIPIN ANTIBODIES, IGG, IGM, IGA
Anticardiolipin IgA: 2.8 [APL'U]/mL (ref <20.0)
Anticardiolipin IgG: <2 [GPL'U]/mL (ref <20.0)
Anticardiolipin IgM: <2 [MPL'U]/mL (ref <20.0)

## 2024-03-11 LAB — HEPATITIS C ANTIBODY: Hepatitis C Ab: NONREACTIVE

## 2024-03-11 LAB — SJOGRENS SYNDROME-A EXTRACTABLE NUCLEAR ANTIBODY: SSA (Ro) (ENA) Antibody, IgG: 5.4 AI — AB

## 2024-03-11 LAB — HEPATITIS B SURFACE ANTIGEN: Hepatitis B Surface Ag: NONREACTIVE

## 2024-03-11 LAB — ANTI-NUCLEAR AB-TITER (ANA TITER): ANA Titer 1: 1:40 {titer} — ABNORMAL HIGH

## 2024-03-11 LAB — THIOPURINE METHYLTRANSFERASE (TPMT), RBC: Thiopurine Methyltransferase, RBC: 16 nmol/h/mL

## 2024-03-11 LAB — GLUCOSE 6 PHOSPHATE DEHYDROGENASE: G-6PDH: 15.6 U/g{Hb} (ref 7.0–20.5)

## 2024-03-11 LAB — ANTI-DNA ANTIBODY, DOUBLE-STRANDED: ds DNA Ab: 32 [IU]/mL — ABNORMAL HIGH

## 2024-03-17 ENCOUNTER — Encounter: Payer: Self-pay | Admitting: Rheumatology

## 2024-03-17 ENCOUNTER — Ambulatory Visit: Attending: Rheumatology | Admitting: Rheumatology

## 2024-03-17 ENCOUNTER — Ambulatory Visit: Admitting: Podiatry

## 2024-03-17 VITALS — BP 114/78 | HR 85 | Temp 98.7°F | Resp 16 | Ht 66.0 in | Wt 223.6 lb

## 2024-03-17 DIAGNOSIS — M79642 Pain in left hand: Secondary | ICD-10-CM

## 2024-03-17 DIAGNOSIS — M79671 Pain in right foot: Secondary | ICD-10-CM

## 2024-03-17 DIAGNOSIS — M3509 Sicca syndrome with other organ involvement: Secondary | ICD-10-CM | POA: Diagnosis not present

## 2024-03-17 DIAGNOSIS — M5030 Other cervical disc degeneration, unspecified cervical region: Secondary | ICD-10-CM

## 2024-03-17 DIAGNOSIS — M2550 Pain in unspecified joint: Secondary | ICD-10-CM | POA: Diagnosis not present

## 2024-03-17 DIAGNOSIS — Q6671 Congenital pes cavus, right foot: Secondary | ICD-10-CM

## 2024-03-17 DIAGNOSIS — M47816 Spondylosis without myelopathy or radiculopathy, lumbar region: Secondary | ICD-10-CM

## 2024-03-17 DIAGNOSIS — J45909 Unspecified asthma, uncomplicated: Secondary | ICD-10-CM

## 2024-03-17 DIAGNOSIS — R7689 Other specified abnormal immunological findings in serum: Secondary | ICD-10-CM

## 2024-03-17 DIAGNOSIS — E559 Vitamin D deficiency, unspecified: Secondary | ICD-10-CM

## 2024-03-17 DIAGNOSIS — M79641 Pain in right hand: Secondary | ICD-10-CM

## 2024-03-17 DIAGNOSIS — M1811 Unilateral primary osteoarthritis of first carpometacarpal joint, right hand: Secondary | ICD-10-CM | POA: Diagnosis not present

## 2024-03-17 DIAGNOSIS — K760 Fatty (change of) liver, not elsewhere classified: Secondary | ICD-10-CM

## 2024-03-17 DIAGNOSIS — M7062 Trochanteric bursitis, left hip: Secondary | ICD-10-CM

## 2024-03-17 DIAGNOSIS — M7061 Trochanteric bursitis, right hip: Secondary | ICD-10-CM

## 2024-03-17 DIAGNOSIS — I480 Paroxysmal atrial fibrillation: Secondary | ICD-10-CM

## 2024-03-17 DIAGNOSIS — Q6672 Congenital pes cavus, left foot: Secondary | ICD-10-CM

## 2024-03-17 DIAGNOSIS — M2242 Chondromalacia patellae, left knee: Secondary | ICD-10-CM

## 2024-03-17 DIAGNOSIS — M79672 Pain in left foot: Secondary | ICD-10-CM

## 2024-03-17 NOTE — Patient Instructions (Addendum)
 Standing Labs We placed an order today for your standing lab work.   Please have your standing labs drawn in May  Please have your labs drawn 2 weeks prior to your appointment so that the provider can discuss your lab results at your appointment, if possible.  Please note that you may see your imaging and lab results in MyChart before we have reviewed them. We will contact you once all results are reviewed. Please allow our office up to 72 hours to thoroughly review all of the results before contacting the office for clarification of your results.  WALK-IN LAB HOURS  Monday through Thursday from 8:00 am - 4:30 pm and Friday from 8:00 am-12:00 pm.  Patients with office visits requiring labs will be seen before walk-in labs.  You may encounter longer than normal wait times. Please allow additional time. Wait times may be shorter on  Monday and Thursday afternoons.  We do not book appointments for walk-in labs. We appreciate your patience and understanding with our staff.   Labs are drawn by [facility]. Please bring your co-pay at the time of your lab draw.  You may receive a bill from [facility] for your lab work.  Please note if you are on Hydroxychloroquine and and an order has been placed for a Hydroxychloroquine level,  you will need to have it drawn 4 hours or more after your last dose.  If you wish to have your labs drawn at another location, please call the office 24 hours in advance so we can fax the orders.  The office is located at [address], [place], [place] [address]   If you have any questions regarding directions or hours of operation,  please call [phone].   As a reminder, please drink plenty of water prior to coming for your lab work. Thanks!   Hand Exercises Hand exercises can be helpful for almost anyone. They can strengthen your hands and improve flexibility and movement. The exercises can also increase blood flow to the hands. These results can make your  work and daily tasks easier for you. Hand exercises can be especially helpful for people who have joint pain from arthritis or nerve damage from using their hands over and over. These exercises can also help people who injure a hand. Exercises Most of these hand exercises are gentle stretching and motion exercises. It is usually safe to do them often throughout the day. Warming up your hands before exercise may help reduce stiffness. You can do this with gentle massage or by placing your hands in warm water for 10-15 minutes. It is normal to feel some stretching, pulling, tightness, or mild discomfort when you begin new exercises. In time, this will improve. Remember to always be careful and stop right away if you feel sudden, very bad pain or your pain gets worse. You want to get better and be safe. Ask your health care provider which exercises are safe for you. Do exercises exactly as told by your provider and adjust them as told. Do not begin these exercises until told by your provider. Knuckle bend or claw fist  Stand or sit with your arm, hand, and all five fingers pointed straight up. Make sure to keep your wrist straight. Gently bend your fingers down toward your palm until the tips of your fingers are touching your palm. Keep your big knuckle straight and only bend the small knuckles in your fingers. Hold this position for 10 seconds. Straighten your fingers back to  your starting position. Repeat this exercise 5-10 times with each hand. Full finger fist  Stand or sit with your arm, hand, and all five fingers pointed straight up. Make sure to keep your wrist straight. Gently bend your fingers into your palm until the tips of your fingers are touching the middle of your palm. Hold this position for 10 seconds. Extend your fingers back to your starting position, stretching every joint fully. Repeat this exercise 5-10 times with each hand. Straight fist  Stand or sit with your arm, hand,  and all five fingers pointed straight up. Make sure to keep your wrist straight. Gently bend your fingers at the big knuckle, where your fingers meet your hand, and at the middle knuckle. Keep the knuckle at the tips of your fingers straight and try to touch the bottom of your palm. Hold this position for 10 seconds. Extend your fingers back to your starting position, stretching every joint fully. Repeat this exercise 5-10 times with each hand. Tabletop  Stand or sit with your arm, hand, and all five fingers pointed straight up. Make sure to keep your wrist straight. Gently bend your fingers at the big knuckle, where your fingers meet your hand, as far down as you can. Keep the small knuckles in your fingers straight. Think of forming a tabletop with your fingers. Hold this position for 10 seconds. Extend your fingers back to your starting position, stretching every joint fully. Repeat this exercise 5-10 times with each hand. Finger spread  Place your hand flat on a table with your palm facing down. Make sure your wrist stays straight. Spread your fingers and thumb apart from each other as far as you can until you feel a gentle stretch. Hold this position for 10 seconds. Bring your fingers and thumb tight together again. Hold this position for 10 seconds. Repeat this exercise 5-10 times with each hand. Making circles  Stand or sit with your arm, hand, and all five fingers pointed straight up. Make sure to keep your wrist straight. Make a circle by touching the tip of your thumb to the tip of your index finger. Hold for 10 seconds. Then open your hand wide. Repeat this motion with your thumb and each of your fingers. Repeat this exercise 5-10 times with each hand. Thumb motion  Sit with your forearm resting on a table and your wrist straight. Your thumb should be facing up toward the ceiling. Keep your fingers relaxed as you move your thumb. Lift your thumb up as high as you can toward the  ceiling. Hold for 10 seconds. Bend your thumb across your palm as far as you can, reaching the tip of your thumb for the small finger (pinkie) side of your palm. Hold for 10 seconds. Repeat this exercise 5-10 times with each hand. Grip strengthening  Hold a stress ball or other soft ball in the middle of your hand. Slowly increase the pressure, squeezing the ball as much as you can without causing pain. Think of bringing the tips of your fingers into the middle of your palm. All of your finger joints should bend when doing this exercise. Hold your squeeze for 10 seconds, then relax. Repeat this exercise 5-10 times with each hand. Contact a health care provider if: Your hand pain or discomfort gets much worse when you do an exercise. Your hand pain or discomfort does not improve within 2 hours after you exercise. If you have either of these problems, stop doing these exercises right  away. Do not do them again unless your provider says that you can. Get help right away if: You develop sudden, severe hand pain or swelling. If this happens, stop doing these exercises right away. Do not do them again unless your provider says that you can. This information is not intended to replace advice given to you by your health care provider. Make sure you discuss any questions you have with your health care provider. Document Revised: 04/28/2022 Document Reviewed: 04/28/2022 Elsevier Patient Education  2024 [employer]Low Back Sprain or Strain Rehab Ask your health care provider which exercises are safe for you. Do exercises exactly as told by your health care provider and adjust them as directed. It is normal to feel mild stretching, pulling, tightness, or discomfort as you do these exercises. Stop right away if you feel sudden pain or your pain gets worse. Do not begin these exercises until told by your health care provider. Stretching and range-of-motion exercises These exercises warm up your muscles and  joints and improve the movement and flexibility of your back. These exercises also help to relieve pain, numbness, and tingling. Lumbar rotation  Lie on your back on a firm bed or the floor with your knees bent. Straighten your arms out to your sides so each arm forms a 90-degree angle (right angle) with a side of your body. Slowly move (rotate) both of your knees to one side of your body until you feel a stretch in your lower back (lumbar). Try not to let your shoulders lift off the floor. Hold this position for __________ seconds. Tense your abdominal muscles and slowly move your knees back to the starting position. Repeat this exercise on the other side of your body. Repeat __________ times. Complete this exercise __________ times a day. Single knee to chest  Lie on your back on a firm bed or the floor with both legs straight. Bend one of your knees. Use your hands to move your knee up toward your chest until you feel a gentle stretch in your lower back and buttock. Hold your leg in this position by holding on to the front of your knee. Keep your other leg as straight as possible. Hold this position for __________ seconds. Slowly return to the starting position. Repeat with your other leg. Repeat __________ times. Complete this exercise __________ times a day. Prone extension on elbows  Lie on your abdomen on a firm bed or the floor (prone position). Prop yourself up on your elbows. Use your arms to help lift your chest up until you feel a gentle stretch in your abdomen and your lower back. This will place some of your body weight on your elbows. If this is uncomfortable, try stacking pillows under your chest. Your hips should stay down, against the surface that you are lying on. Keep your hip and back muscles relaxed. Hold this position for __________ seconds. Slowly relax your upper body and return to the starting position. Repeat __________ times. Complete this exercise __________  times a day. Strengthening exercises These exercises build strength and endurance in your back. Endurance is the ability to use your muscles for a long time, even after they get tired. Pelvic tilt This exercise strengthens the muscles that lie deep in the abdomen. Lie on your back on a firm bed or the floor with your legs extended. Bend your knees so they are pointing toward the ceiling and your feet are flat on the floor. Tighten your lower abdominal muscles to press  your lower back against the floor. This motion will tilt your pelvis so your tailbone points up toward the ceiling instead of pointing to your feet or the floor. To help with this exercise, you may place a small towel under your lower back and try to push your back into the towel. Hold this position for __________ seconds. Let your muscles relax completely before you repeat this exercise. Repeat __________ times. Complete this exercise __________ times a day. Alternating arm and leg raises  Get on your hands and knees on a firm surface. If you are on a hard floor, you may want to use padding, such as an exercise mat, to cushion your knees. Line up your arms and legs. Your hands should be directly below your shoulders, and your knees should be directly below your hips. Lift your left leg behind you. At the same time, raise your right arm and straighten it in front of you. Do not lift your leg higher than your hip. Do not lift your arm higher than your shoulder. Keep your abdominal and back muscles tight. Keep your hips facing the ground. Do not arch your back. Keep your balance carefully, and do not hold your breath. Hold this position for __________ seconds. Slowly return to the starting position. Repeat with your right leg and your left arm. Repeat __________ times. Complete this exercise __________ times a day. Abdominal set with straight leg raise  Lie on your back on a firm bed or the floor. Bend one of your knees and  keep your other leg straight. Tense your abdominal muscles and lift your straight leg up, 4-6 inches (10-15 cm) off the ground. Keep your abdominal muscles tight and hold this position for __________ seconds. Do not hold your breath. Do not arch your back. Keep it flat against the ground. Keep your abdominal muscles tense as you slowly lower your leg back to the starting position. Repeat with your other leg. Repeat __________ times. Complete this exercise __________ times a day. Single leg lower with bent knees Lie on your back on a firm bed or the floor. Tense your abdominal muscles and lift your feet off the floor, one foot at a time, so your knees and hips are bent in 90-degree angles (right angles). Your knees should be over your hips and your lower legs should be parallel to the floor. Keeping your abdominal muscles tense and your knee bent, slowly lower one of your legs so your toe touches the ground. Lift your leg back up to return to the starting position. Do not hold your breath. Do not let your back arch. Keep your back flat against the ground. Repeat with your other leg. Repeat __________ times. Complete this exercise __________ times a day. Posture and body mechanics Good posture and healthy body mechanics can help to relieve stress in your body's tissues and joints. Body mechanics refers to the movements and positions of your body while you do your daily activities. Posture is part of body mechanics. Good posture means: Your spine is in its natural S-curve position (neutral). Your shoulders are pulled back slightly. Your head is not tipped forward (neutral). Follow these guidelines to improve your posture and body mechanics in your everyday activities. Standing  When standing, keep your spine neutral and your feet about hip-width apart. Keep a slight bend in your knees. Your ears, shoulders, and hips should line up. When you do a task in which you stand in one place for a long  time,  place one foot up on a stable object that is 2-4 inches (5-10 cm) high, such as a footstool. This helps keep your spine neutral. Sitting  When sitting, keep your spine neutral and keep your feet flat on the floor. Use a footrest, if necessary, and keep your thighs parallel to the floor. Avoid rounding your shoulders, and avoid tilting your head forward. When working at a desk or a computer, keep your desk at a height where your hands are slightly lower than your elbows. Slide your chair under your desk so you are close enough to maintain good posture. When working at a computer, place your monitor at a height where you are looking straight ahead and you do not have to tilt your head forward or downward to look at the screen. Resting When lying down and resting, avoid positions that are most painful for you. If you have pain with activities such as sitting, bending, stooping, or squatting, lie in a position in which your body does not bend very much. For example, avoid curling up on your side with your arms and knees near your chest (fetal position). If you have pain with activities such as standing for a long time or reaching with your arms, lie with your spine in a neutral position and bend your knees slightly. Try the following positions: Lying on your side with a pillow between your knees. Lying on your back with a pillow under your knees. Lifting  When lifting objects, keep your feet at least shoulder-width apart and tighten your abdominal muscles. Bend your knees and hips and keep your spine neutral. It is important to lift using the strength of your legs, not your back. Do not lock your knees straight out. Always ask for help to lift heavy or awkward objects. This information is not intended to replace advice given to you by your health care provider. Make sure you discuss any questions you have with your health care provider. Document Revised: 08/17/2022 Document Reviewed:  07/01/2020 Elsevier Patient Education  2024 [employer].Cervical Strain and Sprain Rehab Ask your health care provider which exercises are safe for you. Do exercises exactly as told by your health care provider and adjust them as directed. It is normal to feel mild stretching, pulling, tightness, or discomfort as you do these exercises. Stop right away if you feel sudden pain or your pain gets worse. Do not begin these exercises until told by your health care provider. Stretching and range-of-motion exercises Cervical side bending  Using good posture, sit on a stable chair or stand up. Without moving your shoulders, slowly tilt your left / right ear to your shoulder until you feel a stretch in the neck muscles on the opposite side. You should be looking straight ahead. Hold for __________ seconds. Repeat with the other side of your neck. Repeat __________ times. Complete this exercise __________ times a day. Cervical rotation  Using good posture, sit on a stable chair or stand up. Slowly turn your head to the side as if you are looking over your left / right shoulder. Keep your eyes level with the ground. Stop when you feel a stretch along the side and the back of your neck. Hold for __________ seconds. Repeat this by turning to your other side. Repeat __________ times. Complete this exercise __________ times a day. Thoracic extension and pectoral stretch  Roll a towel or a small blanket so it is about 4 inches (10 cm) in diameter. Lie down on your back on  a firm surface. Put the towel in the middle of your back across your spine. It should not be under your shoulder blades. Put your hands behind your head and let your elbows fall out to your sides. Hold for __________ seconds. Repeat __________ times. Complete this exercise __________ times a day. Strengthening exercises Upper cervical flexion  Lie on your back with a thin pillow behind your head or a small, rolled-up towel under  your neck. Gently tuck your chin toward your chest and nod your head down to look toward your feet. Do not lift your head off the pillow. Hold for __________ seconds. Release the tension slowly. Relax your neck muscles completely before you repeat this exercise. Repeat __________ times. Complete this exercise __________ times a day. Cervical extension  Stand about 6 inches (15 cm) away from a wall, with your back facing the wall. Place a soft object, about 6-8 inches (15-20 cm) in diameter, between the back of your head and the wall. A soft object could be a small pillow, a ball, or a folded towel. Gently tilt your head back and press into the soft object. Keep your jaw and forehead relaxed. Hold for __________ seconds. Release the tension slowly. Relax your neck muscles completely before you repeat this exercise. Repeat __________ times. Complete this exercise __________ times a day. Posture and body mechanics Body mechanics refer to the movements and positions of your body while you do your daily activities. Posture is part of body mechanics. Good posture and healthy body mechanics can help to relieve stress in your body's tissues and joints. Good posture means that your spine is in its natural S-curve position (your spine is neutral), your shoulders are pulled back slightly, and your head is not tipped forward. The following are general guidelines for using improved posture and body mechanics in your everyday activities. Sitting  When sitting, keep your spine neutral and keep your feet flat on the floor. Use a footrest, if needed, and keep your thighs parallel to the floor. Avoid rounding your shoulders. Avoid tilting your head forward. When working at a desk or a computer, keep your desk at a height where your hands are slightly lower than your elbows. Slide your chair under your desk so you are close enough to maintain good posture. When working at a computer, place your monitor at a height  where you are looking straight ahead and you do not have to tilt your head forward or downward to look at the screen. Standing  When standing, keep your spine neutral and keep your feet about hip-width apart. Keep a slight bend in your knees. Your ears, shoulders, and hips should line up. When you do a task in which you stand in one place for a long time, place one foot up on a stable object that is 2-4 inches (5-10 cm) high, such as a footstool. This helps keep your spine neutral. Resting When lying down and resting, avoid positions that are most painful for you. Try to support your neck in a neutral position. You can use a contour pillow or a small rolled-up towel. Your pillow should support your neck but not push on it. This information is not intended to replace advice given to you by your health care provider. Make sure you discuss any questions you have with your health care provider. Document Revised: 08/17/2022 Document Reviewed: 11/03/2021 Elsevier Patient Education  2024 [employer].

## 2024-03-22 ENCOUNTER — Telehealth: Payer: Self-pay | Admitting: Rheumatology

## 2024-03-22 NOTE — Telephone Encounter (Signed)
 Patient contacted the office requesting a referral to physical therapy .  Patient request referral for pain.  Patient's preferred area for referral is: [place]

## 2024-03-22 NOTE — Telephone Encounter (Signed)
 Per office note on 03/17/2024: I offered physical therapy but she would like to try exercises first. A handout on hand exercises was given.  I also offered physical therapy. Patient will contact us  if she wants physical therapy.   Please review and sign pended PT referral. Thanks!

## 2024-03-31 ENCOUNTER — Telehealth: Admitting: Family Medicine

## 2024-03-31 ENCOUNTER — Ambulatory Visit: Payer: Self-pay

## 2024-03-31 DIAGNOSIS — J4520 Mild intermittent asthma, uncomplicated: Secondary | ICD-10-CM

## 2024-03-31 DIAGNOSIS — J069 Acute upper respiratory infection, unspecified: Secondary | ICD-10-CM

## 2024-03-31 MED ORDER — PREDNISONE 20 MG PO TABS: 20.0000 mg | ORAL_TABLET | Freq: Two times a day (BID) | ORAL | 0 refills | Status: AC

## 2024-03-31 MED ORDER — PROMETHAZINE-DM 6.25-15 MG/5ML PO SYRP: 5.0000 mL | ORAL_SOLUTION | Freq: Four times a day (QID) | ORAL | 0 refills | Status: AC | PRN

## 2024-03-31 MED ORDER — TRELEGY ELLIPTA 200-62.5-25 MCG/ACT IN AEPB: 1.0000 | INHALATION_SPRAY | Freq: Every day | RESPIRATORY_TRACT | 0 refills | Status: AC

## 2024-03-31 MED ORDER — AIRSUPRA 90-80 MCG/ACT IN AERO: 2.0000 | INHALATION_SPRAY | Freq: Four times a day (QID) | RESPIRATORY_TRACT | 1 refills | Status: AC

## 2024-03-31 MED ORDER — AZITHROMYCIN 250 MG PO TABS: ORAL_TABLET | ORAL | 0 refills | Status: AC

## 2024-03-31 NOTE — Patient Instructions (Signed)

## 2024-03-31 NOTE — Telephone Encounter (Signed)
 FYI Only or Action Required?: FYI only for provider: virtual UC visit 03/31/24.  Patient was last seen in primary care on 01/28/2024 by Catherine Fuller A, DO.  Called Nurse Triage reporting Nasal Congestion.  Symptoms began several days ago.  Interventions attempted: OTC medications: tylenol  and Prescription medications: Trilogy.  Symptoms are: gradually worsening.  Triage Disposition: See HCP Within 4 Hours (Or PCP Triage)  Patient/caregiver understands and will follow disposition?: Yes  Copied from CRM [id number]. Topic: Clinical - Red Word Triage >> Mar 31, 2024  1:44 PM Sasha M wrote: Red Word that prompted transfer to Nurse Triage: symptoms started Tuesday, sore throat, stuffy nose, fever, congestion. Pt has asthma and is concerned about upper respiratory infection Reason for Disposition  [1] Fever > 101 F (38.3 C) AND [2] age > 60 years  Answer Assessment - Initial Assessment Questions Pt called in stating she has had worsening chest congestion, sore throat and fever since Tuesday; pt's main concern is upper respiratory infection causing asthma attack. Pt taking tylenol  for body aches and trilogy PRN. Pt denies any chest pain, SOB or distress at this time. Pt requested virtual visit d/t symptoms. Appointment scheduled for evaluation. Patient agrees with plan of care, and will call back if anything changes, or if symptoms worsen.     1. LOCATION: Where does it hurt?      Sore throat, congestion  2. ONSET: When did the sinus pain start?  (e.g., hours, days)      Since Tuesday   3. SEVERITY: How bad is the pain?   (Scale 0-10; or none, mild, moderate or severe)     N/a   4. RECURRENT SYMPTOM: Have you ever had sinus problems before? If Yes, ask: When was the last time? and What happened that time?      Pt reports having Upper respiratory infections that always flare her asthma   5. NASAL CONGESTION: Is the nose blocked? If Yes, ask: Can you open it or must you  breathe through your mouth?     Congestion; able to breath through mouth but congestion is more in chest causing cough   6. NASAL DISCHARGE: Do you have discharge from your nose? If so ask, What color?     Unable to clear from chest; nasal drainage clear   7. FEVER: Do you have a fever? If Yes, ask: What is it, how was it measured, and when did it start?      100.9 last night, 12/04; 99.9 today during NT call  8. OTHER SYMPTOMS: Do you have any other symptoms? (e.g., sore throat, cough, earache, difficulty breathing)     Sore throat, cough, chills and body aches  Protocols used: Sinus Pain or Congestion-A-AH

## 2024-03-31 NOTE — Progress Notes (Signed)
 Virtual Visit Consent   Jade [name], you are scheduled for a virtual visit with a [facility] provider today. Just as with appointments in the office, your consent must be obtained to participate. Your consent will be active for this visit and any virtual visit you may have with one of our providers in the next 365 days. If you have a MyChart account, a copy of this consent can be sent to you electronically.  As this is a virtual visit, video technology does not allow for your provider to perform a traditional examination. This may limit your provider's ability to fully assess your condition. If your provider identifies any concerns that need to be evaluated in person or the need to arrange testing (such as labs, EKG, etc.), we will make arrangements to do so. Although advances in technology are sophisticated, we cannot ensure that it will always work on either your end or our end. If the connection with a video visit is poor, the visit may have to be switched to a telephone visit. With either a video or telephone visit, we are not always able to ensure that we have a secure connection.  By engaging in this virtual visit, you consent to the provision of healthcare and authorize for your insurance to be billed (if applicable) for the services provided during this visit. Depending on your insurance coverage, you may receive a charge related to this service.  I need to obtain your verbal consent now. Are you willing to proceed with your visit today? Jade [name] has provided verbal consent on 03/31/2024 for a virtual visit (video or telephone). Loa Lamp, FNP  Date: 03/31/2024 4:58 PM   Virtual Visit via Video Note   I, Loa Lamp, connected with  Jade [name]  ([id number], 01/13/1961) on 03/31/24 at  5:00 PM EST by a video-enabled telemedicine application and verified that I am speaking with the correct person using two identifiers.  Location: Patient: Virtual Visit Location Patient:  Home Provider: Virtual Visit Location Provider: Home Office   I discussed the limitations of evaluation and management by telemedicine and the availability of in person appointments. The patient expressed understanding and agreed to proceed.    History of Present Illness: Jade [name] is a 63 y.o. who identifies as a female who was assigned female at birth, and is being seen today for runny nose sore throat, chills, body aches fever, 101.4, history of asthma, coughing, green mucus with sx worsening. Using trelegy and airsupra . She has nebs to use. In no distress but appears sickly.   HPI: HPI  Problems:  Patient Active Problem List   Diagnosis Date Noted   Polyarthralgia 08/30/2023   Positive ANA (antinuclear antibody) 08/30/2023   Ds DNA antibody positive 08/30/2023   Calf tenderness 08/27/2023   Paroxysmal atrial fibrillation (HCC)-status post ablation 04/30/2023   Pericarditis 04/22/2023   Hepatic steatosis 04/16/2023   Chondromalacia of left patella 09/08/2021   DDD (degenerative disc disease), lumbar 04/07/2019   Nonallopathic lesion of lumbosacral region 04/07/2019   Nonallopathic lesion of sacral region 04/07/2019   Inflammation of sacroiliac joint 11/25/2018   Lumbar spondylosis 08/30/2018   Arthritis of carpometacarpal [facility]) joint of right thumb 12/24/2017   Nonallopathic lesion of cervical region 12/24/2017   Nonallopathic lesion of thoracic region 12/24/2017   Nonallopathic lesion of rib cage 12/24/2017   Obesity (BMI 30-39.9) 05/14/2017   H/O vitamin D  deficiency 01/24/2016   Menopausal symptoms 12/02/2012   Asthma    HSV-2 (herpes simplex virus 2)  infection     Allergies:  Allergies  Allergen Reactions   Ciprofloxacin Anaphylaxis and Other (See Comments)   Eliquis  [Apixaban ] Shortness Of Breath    SOB/heavy chest   Sulfa Antibiotics     hives   Cat Dander Other (See Comments)   Dog Epithelium (Canis Lupus Familiaris) Other (See Comments)   Medications:   Current Outpatient Medications:    AIRSUPRA  90-80 MCG/ACT AERO, SMARTSIG:2 Puff(s) Via Inhaler Every 2 Hours PRN, Disp: , Rfl:    albuterol (VENTOLIN HFA) 108 (90 Base) MCG/ACT inhaler, Inhale into the lungs every 6 (six) hours as needed for wheezing or shortness of breath. (Patient not taking: Reported on 03/17/2024), Disp: , Rfl:    diclofenac  (VOLTAREN ) 75 MG EC tablet, Take 1 tablet (75 mg total) by mouth 2 (two) times daily. (Patient taking differently: Take 75 mg by mouth as needed.), Disp: 180 tablet, Rfl: 1   furosemide  (LASIX ) 20 MG tablet, TAKE 1 TABLET (20 MG TOTAL) BY MOUTH DAILY AS NEEDED (WT GAIN/SHORTNESS OF BREATH)., Disp: , Rfl:    ipratropium-albuterol (DUONEB) 0.5-2.5 (3) MG/3ML SOLN, SMARTSIG:1 Vial(s) Every 4-6 Hours PRN, Disp: , Rfl:    montelukast  (SINGULAIR ) 10 MG tablet, Take 1 tablet (10 mg total) by mouth at bedtime. (Patient taking differently: Take 10 mg by mouth as needed.), Disp: 90 tablet, Rfl: 3   pantoprazole  (PROTONIX ) 40 MG tablet, Take 1 tablet (40 mg total) by mouth daily. (Patient taking differently: Take 40 mg by mouth as needed.), Disp: 30 tablet, Rfl: 3   sucralfate  (CARAFATE ) 1 g tablet, Take 1 tablet (1 g total) by mouth 4 (four) times daily -  with meals and at bedtime. (Patient not taking: Reported on 03/17/2024), Disp: 120 tablet, Rfl: 0   TRELEGY ELLIPTA  200-62.5-25 MCG/ACT AEPB, Inhale 1 puff into the lungs daily. (Patient taking differently: Inhale 1 puff into the lungs as needed.), Disp: , Rfl:    valACYclovir  (VALTREX ) 500 MG tablet, Take 1 tablet (500 mg total) by mouth daily. (Patient taking differently: Take 500 mg by mouth as needed.), Disp: 90 tablet, Rfl: 3  Observations/Objective: Patient is well-developed, well-nourished in no acute distress.  Resting comfortably  at home.  Head is normocephalic, atraumatic.  No labored breathing.  Speech is clear and coherent with logical content.  Patient is alert and oriented at baseline.     Assessment and Plan: There are no diagnoses linked to this encounter. Increase fluids, humidifier at night, UC or ED if sx worsen   Follow Up Instructions: I discussed the assessment and treatment plan with the patient. The patient was provided an opportunity to ask questions and all were answered. The patient agreed with the plan and demonstrated an understanding of the instructions.  A copy of instructions were sent to the patient via MyChart unless otherwise noted below.     The patient was advised to call back or seek an in-person evaluation if the symptoms worsen or if the condition fails to improve as anticipated.    Tammara Massing, FNP

## 2024-04-05 DIAGNOSIS — H1013 Acute atopic conjunctivitis, bilateral: Secondary | ICD-10-CM | POA: Diagnosis not present

## 2024-04-05 DIAGNOSIS — J3089 Other allergic rhinitis: Secondary | ICD-10-CM | POA: Diagnosis not present

## 2024-04-05 DIAGNOSIS — J301 Allergic rhinitis due to pollen: Secondary | ICD-10-CM | POA: Diagnosis not present

## 2024-04-05 DIAGNOSIS — J4550 Severe persistent asthma, uncomplicated: Secondary | ICD-10-CM | POA: Diagnosis not present

## 2024-04-07 ENCOUNTER — Telehealth: Payer: Self-pay | Admitting: Rheumatology

## 2024-04-07 ENCOUNTER — Ambulatory Visit: Admitting: Podiatry

## 2024-04-07 DIAGNOSIS — H43812 Vitreous degeneration, left eye: Secondary | ICD-10-CM | POA: Diagnosis not present

## 2024-04-07 NOTE — Telephone Encounter (Signed)
 See telephone encounter from 03/22/2024. Sent to Dr. Dolphus again to review and sign referral.

## 2024-04-07 NOTE — Telephone Encounter (Signed)
 Can you please review and sign the pended PT referral? Patient has called regarding the referral. Thanks!

## 2024-04-07 NOTE — Telephone Encounter (Signed)
 Patient called stating at her appointment last month Dr. Dolphus offered a referral to PT for her back and hand pain.  Patient states she wanted to think about it first and called back on 03/22/24 requesting a referral to a PT clinic in [place].  Patient states she hasn't received a return call and checking on the status of the referral.

## 2024-04-14 ENCOUNTER — Ambulatory Visit: Admitting: Family Medicine

## 2024-04-14 ENCOUNTER — Encounter: Payer: Self-pay | Admitting: Family Medicine

## 2024-04-14 VITALS — BP 112/70 | HR 84 | Temp 98.0°F | Ht 66.0 in | Wt 223.2 lb

## 2024-04-14 DIAGNOSIS — H6121 Impacted cerumen, right ear: Secondary | ICD-10-CM | POA: Diagnosis not present

## 2024-04-14 DIAGNOSIS — Z0184 Encounter for antibody response examination: Secondary | ICD-10-CM | POA: Diagnosis not present

## 2024-04-14 MED ORDER — DEBROX 6.5 % OT SOLN: OTIC | 0 refills | Status: DC

## 2024-04-14 NOTE — Progress Notes (Signed)
 "     Jade [name] , 14-Aug-1960, 63 y.o., female MRN: [id number] Patient Care Team    Relationship Specialty Notifications Start End  Catherine Charlies LABOR, DO PCP - General Family Medicine  01/27/23   Abran Norleen SAILOR, MD Consulting Physician Gastroenterology  05/14/17   Claudene Lenis, OD Referring Physician Optometry  05/14/17   Elizabeth Silvano HERO, MD Consulting Physician Allergy  05/14/17   Tobie Franky SQUIBB, DPM Consulting Physician Podiatry  01/28/24   Kennyth Chew, MD Consulting Physician Cardiology  01/28/24   O'Neal, Darryle Ned, MD Consulting Physician Cardiology  01/28/24   Marne Kelly Nest, MD Consulting Physician Obstetrics and Gynecology  01/28/24   Sharl Selinda Dover, MD Consulting Physician Orthopedic Surgery  01/28/24     Chief Complaint  Patient presents with   Follow-up    Request MMR titer and right ear stopped up.      Subjective: Jade [name] is a 63 y.o. Pt presents for an OV with complaints of hearing loss in her right ear of 1 day duration.  Associated symptoms include She has had a cold over the last week, that has improved and symptoms almost resolved.   Pt has tried nothing to ease their symptoms.   Pt also reports her employer is requiring a MMR titer.      10/15/2023    8:11 AM 06/04/2023    1:58 PM 02/08/2023    9:42 AM 10/09/2022    2:44 PM 09/19/2021    8:09 AM  Depression screen PHQ 2/9  Decreased Interest 0 0 1 0 0  Down, Depressed, Hopeless 0 0 0 0 0  PHQ - 2 Score 0 0 1 0 0  Altered sleeping 1 0     Tired, decreased energy 1 0     Change in appetite 0 0     Feeling bad or failure about yourself  0 0     Trouble concentrating 0 0     Moving slowly or fidgety/restless 0 0     Suicidal thoughts 0 0     PHQ-9 Score 2  0      Difficult doing work/chores Not difficult at all Not difficult at all        Data saved with a previous flowsheet row definition    Allergies[1] Social History   Social History Narrative   Married. One child.    College-educated, works as a armed forces operational officer.   Smoke alarm in the home. Wears her seatbelt.   Feels safe in her relationship.   Past Medical History:  Diagnosis Date   Anxiety    Asthma    uses inhaler prn   Chest pain    Colon polyps    Diarrhea    with constipation while taking diet pills (Alli)   History of rectal bleeding     2 times in past   HSV-2 (herpes simplex virus 2) infection    Palpitations    in past/due to Advil  pm   Vitamin D  deficiency    Past Surgical History:  Procedure Laterality Date   ABDOMINAL HYSTERECTOMY  1998   fibroids. partial   ATRIAL FIBRILLATION ABLATION N/A 04/20/2023   Procedure: ATRIAL FIBRILLATION ABLATION;  Surgeon: Kennyth Chew, MD;  Location: [facility] INVASIVE CV LAB;  Service: Cardiovascular;  Laterality: N/A;   AXILLARY LYMPH NODE BIOPSY Left 02/2024   CATARACT EXTRACTION Bilateral    2020, 2025   CESAREAN SECTION     1 time   COLONOSCOPY W/ POLYPECTOMY  2017   RESECTOSCOPIC MYOMECTOMY     Family History  Problem Relation Age of Onset   Asthma Mother    Diabetes Father    Hypertension Father    Heart disease Father    Prostate cancer Father    Thyroid  disease Sister    Atrial fibrillation Sister    Atrial fibrillation Brother    Diabetes Paternal Uncle    Lung cancer Paternal Grandfather    Healthy Daughter    Colon cancer Neg Hx    Colon polyps Neg Hx    Rectal cancer Neg Hx    Stomach cancer Neg Hx    Esophageal cancer Neg Hx    Allergies as of 04/14/2024       Reactions   Ciprofloxacin Anaphylaxis, Other (See Comments)   Eliquis  [apixaban ] Shortness Of Breath   SOB/heavy chest   Sulfa Antibiotics    hives   Cat Dander Other (See Comments)   Dog Epithelium (canis Lupus Familiaris) Other (See Comments)        Medication List        Accurate as of April 14, 2024 10:23 AM. If you have any questions, ask your nurse or doctor.          Airsupra  90-80 MCG/ACT Aero Generic drug:  Albuterol-Budesonide SMARTSIG:2 Puff(s) Via Inhaler Every 2 Hours PRN   Airsupra  90-80 MCG/ACT Aero Generic drug: Albuterol-Budesonide Inhale 2 puffs into the lungs in the morning, at noon, in the evening, and at bedtime.   albuterol 108 (90 Base) MCG/ACT inhaler Commonly known as: VENTOLIN HFA Inhale into the lungs every 6 (six) hours as needed for wheezing or shortness of breath.   Debrox 6.5 % OTIC solution Generic drug: carbamide peroxide 5 drops right ear  as needed, follow by gentle warm water lavage Started by: Charlies Bellini, DO   diclofenac  75 MG EC tablet Commonly known as: VOLTAREN  Take 1 tablet (75 mg total) by mouth 2 (two) times daily. What changed:  when to take this reasons to take this   furosemide  20 MG tablet Commonly known as: LASIX  TAKE 1 TABLET (20 MG TOTAL) BY MOUTH DAILY AS NEEDED (WT GAIN/SHORTNESS OF BREATH).   ipratropium-albuterol 0.5-2.5 (3) MG/3ML Soln Commonly known as: DUONEB SMARTSIG:1 Vial(s) Every 4-6 Hours PRN   montelukast  10 MG tablet Commonly known as: SINGULAIR  Take 1 tablet (10 mg total) by mouth at bedtime. What changed:  when to take this reasons to take this   pantoprazole  40 MG tablet Commonly known as: PROTONIX  Take 1 tablet (40 mg total) by mouth daily. What changed:  when to take this reasons to take this   sucralfate  1 g tablet Commonly known as: Carafate  Take 1 tablet (1 g total) by mouth 4 (four) times daily -  with meals and at bedtime.   Trelegy Ellipta  200-62.5-25 MCG/ACT Aepb Generic drug: Fluticasone -Umeclidin-Vilant Inhale 1 puff into the lungs daily.   valACYclovir  500 MG tablet Commonly known as: VALTREX  Take 1 tablet (500 mg total) by mouth daily. What changed:  when to take this reasons to take this        All past medical history, surgical history, allergies, family history, immunizations andmedications were updated in the EMR today and reviewed under the history and medication portions of  their EMR.     ROS Negative, with the exception of above mentioned in HPI   Objective:  BP 112/70   Pulse 84   Temp 98 F (36.7 C)   Ht 5' 6 (1.676  m)   Wt 223 lb 3.2 oz (101.2 kg)   SpO2 98%   BMI 36.03 kg/m  Body mass index is 36.03 kg/m.  Physical Exam Vitals and nursing note reviewed.  Constitutional:      General: She is not in acute distress.    Appearance: Normal appearance. She is normal weight. She is not ill-appearing or toxic-appearing.  HENT:     Head: Normocephalic and atraumatic.     Right Ear: There is impacted cerumen.     Left Ear: Tympanic membrane, ear canal and external ear normal. There is no impacted cerumen.  Eyes:     General: No scleral icterus.       Right eye: No discharge.        Left eye: No discharge.     Extraocular Movements: Extraocular movements intact.     Conjunctiva/sclera: Conjunctivae normal.     Pupils: Pupils are equal, round, and reactive to light.  Skin:    Findings: No rash.  Neurological:     Mental Status: She is alert and oriented to person, place, and time. Mental status is at baseline.     Motor: No weakness.     Coordination: Coordination normal.     Gait: Gait normal.  Psychiatric:        Mood and Affect: Mood normal.        Behavior: Behavior normal.        Thought Content: Thought content normal.        Judgment: Judgment normal.      No results found. No results found. No results found for this or any previous visit (from the past 24 hours).  Assessment/Plan: Jade [name] is a 63 y.o. female present for OV for  Immunity status testing (Primary) Required for work - Measles/Mumps/Rubella Immunity Hearing loss of right ear due to cerumen impaction Procedure: Cerumen disimpaction Patient was verbally consented to procedure. Water-peroxide solution was applied and gentle ear lavage performed on Right ear(s).  There were no complications.  Tympanic membrane was partially visualized after disimpaction.   Tympanic membrane(s) intact.  Auditory canal(s) WNL.  Patient tolerated procedure well.  Patient reported relief of symptoms after removal of cerumen. Debrox solution prescribed for use prn with warm water lavage as needed to clear the remaining cerumen.    Reviewed expectations re: course of current medical issues. Discussed self-management of symptoms. Outlined signs and symptoms indicating need for more acute intervention. Patient verbalized understanding and all questions were answered. Patient received an After-Visit Summary.    Orders Placed This Encounter  Procedures   Measles/Mumps/Rubella Immunity   Meds ordered this encounter  Medications   carbamide peroxide (DEBROX) 6.5 % OTIC solution    Sig: 5 drops right ear  as needed, follow by gentle warm water lavage    Dispense:  15 mL    Refill:  0   Referral Orders  No referral(s) requested today     Note is dictated utilizing voice recognition software. Although note has been proof read prior to signing, occasional typographical errors still can be missed. If any questions arise, please do not hesitate to call for verification.   electronically signed by:  Charlies Bellini, DO  [facility] [place]       [1]  Allergies Allergen Reactions   Ciprofloxacin Anaphylaxis and Other (See Comments)   Eliquis  [Apixaban ] Shortness Of Breath    SOB/heavy chest   Sulfa Antibiotics     hives   Cat Dander Other (  See Comments)   Dog Epithelium (Canis Lupus Familiaris) Other (See Comments)   "

## 2024-04-14 NOTE — Patient Instructions (Signed)

## 2024-04-15 LAB — MEASLES/MUMPS/RUBELLA IMMUNITY
Mumps IgG: 72.1 [AU]/ml
Rubella: 11.4 {index}
Rubeola IgG: >300 [AU]/ml

## 2024-04-17 ENCOUNTER — Ambulatory Visit: Payer: Self-pay | Admitting: Family Medicine

## 2024-04-22 ENCOUNTER — Other Ambulatory Visit: Payer: Self-pay | Admitting: Family Medicine

## 2024-05-05 ENCOUNTER — Other Ambulatory Visit: Payer: Self-pay | Admitting: Family Medicine

## 2024-05-05 DIAGNOSIS — R590 Localized enlarged lymph nodes: Secondary | ICD-10-CM

## 2024-05-12 ENCOUNTER — Encounter: Payer: Self-pay | Admitting: Physical Therapy

## 2024-05-12 ENCOUNTER — Ambulatory Visit: Admitting: Physical Therapy

## 2024-05-12 DIAGNOSIS — M5459 Other low back pain: Secondary | ICD-10-CM | POA: Diagnosis not present

## 2024-05-12 NOTE — Therapy (Signed)
 " OUTPATIENT PHYSICAL THERAPY LOWER EXTREMITY EVALUATION   Patient Name: Jade [name] MRN: [id number] DOB:Jan 09, 1961, 64 y.o., female Today's Date: 05/12/2024  END OF SESSION:  PT End of Session - 05/12/24 1244     Visit Number 1    Number of Visits 16    Date for Recertification  07/07/24    Authorization Type BCBS    PT Start Time 1110    PT Stop Time 1148    PT Time Calculation (min) 38 min    Activity Tolerance Patient tolerated treatment well    Behavior During Therapy WFL for tasks assessed/performed          Past Medical History:  Diagnosis Date   Anxiety    Asthma    uses inhaler prn   Chest pain    Colon polyps    Diarrhea    with constipation while taking diet pills (Alli)   History of rectal bleeding     2 times in past   HSV-2 (herpes simplex virus 2) infection    Palpitations    in past/due to Advil  pm   Vitamin D  deficiency    Past Surgical History:  Procedure Laterality Date   ABDOMINAL HYSTERECTOMY  1998   fibroids. partial   ATRIAL FIBRILLATION ABLATION N/A 04/20/2023   Procedure: ATRIAL FIBRILLATION ABLATION;  Surgeon: Kennyth Chew, MD;  Location: [facility] INVASIVE CV LAB;  Service: Cardiovascular;  Laterality: N/A;   AXILLARY LYMPH NODE BIOPSY Left 02/2024   CATARACT EXTRACTION Bilateral    2020, 2025   CESAREAN SECTION     1 time   COLONOSCOPY W/ POLYPECTOMY  2017   RESECTOSCOPIC MYOMECTOMY     Patient Active Problem List   Diagnosis Date Noted   Polyarthralgia 08/30/2023   Positive ANA (antinuclear antibody) 08/30/2023   Ds DNA antibody positive 08/30/2023   Calf tenderness 08/27/2023   Paroxysmal atrial fibrillation (HCC)-status post ablation 04/30/2023   Pericarditis 04/22/2023   Hepatic steatosis 04/16/2023   Chondromalacia of left patella 09/08/2021   DDD (degenerative disc disease), lumbar 04/07/2019   Nonallopathic lesion of lumbosacral region 04/07/2019   Nonallopathic lesion of sacral region 04/07/2019   Inflammation of  sacroiliac joint 11/25/2018   Lumbar spondylosis 08/30/2018   Arthritis of carpometacarpal [facility]) joint of right thumb 12/24/2017   Nonallopathic lesion of cervical region 12/24/2017   Nonallopathic lesion of thoracic region 12/24/2017   Nonallopathic lesion of rib cage 12/24/2017   Obesity (BMI 30-39.9) 05/14/2017   H/O vitamin D  deficiency 01/24/2016   Menopausal symptoms 12/02/2012   Asthma    HSV-2 (herpes simplex virus 2) infection     PCP: Charlies Bellini  REFERRING PROVIDER: Maya Nash   REFERRING DIAG: Low back pain   THERAPY DIAG:  Other low back pain  Rationale for Evaluation and Treatment: Rehabilitation  ONSET DATE: 1 yr ago     SUBJECTIVE:   SUBJECTIVE STATEMENT: Pt states increased pain about 1 year ago, when she was cleaning her car. States increased pain in center, low/on tailbone.  Waking up at night due to pain. States pain is Mostly at night when trying to go to sleep, can't get comfortable. She is Taking tylenol .  More recently she is Also starting to feel it while at work. 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 She sits with legs wide apart.  Also notes more pain with bending and gardening.  Exercise:  none right now.  Pl fascitis:  L and R.  R now worse.  Wearing hokas.  And inserts. Over  1 year.  Hokas: not helping pain- likes the cushion.   L lympoh node swollen- sore, will f/u with MD,  Did have biopsy that was negative. ( 2 1/2 mo ago)    PERTINENT HISTORY: A-Fib , polyarthralgia, + ANA  PAIN:  Are you having pain? Yes: NPRS scale: 7-8 /10 Pain location: Back , not radiating, no numbness  Pain description: sore  Aggravating factors: sitting, bending , work , gardening, sleeping  Relieving factors: none stated    PRECAUTIONS: None  WEIGHT BEARING RESTRICTIONS: No  FALLS:  Has patient fallen in last 6 months? No   PLOF: Independent  PATIENT GOALS:  Decreased pain in tailbone    NEXT MD VISIT:   OBJECTIVE:   DIAGNOSTIC FINDINGS:   PATIENT  SURVEYS:    COGNITION: Overall cognitive status: Within functional limits for tasks assessed     SENSATION: WFL  EDEMA: N/A   POSTURE:    No Significant postural limitations Seated work position: legs wide in ER/abducted position.   PALPATION:  No tenderness to lumbar spine. L paraspinals tighter than R.   Most Pain directly on sacrum no pain at coccyx. mild into sacral ligament bil;    LOWER EXTREMITY ROM: Lumbar: WFL Hips: WFL Knees: WFL Ankle: will test next visit   LOWER EXTREMITY MMT: Will test ankle next visit  MMT Left eval Right  eval  Hip flexion 4 4  Hip extension    Hip abduction 4+ 4+  Hip adduction    Hip internal rotation    Hip external rotation    Knee flexion 5 5  Knee extension 5 5  Ankle dorsiflexion    Ankle plantarflexion    Ankle inversion    Ankle eversion     (Blank rows = not tested)  LOWER EXTREMITY SPECIAL TESTS:  Pt with relief in hip ER position, and with hip add squeeze to relieve pressure on sacrum    GAIT: Unremarkable    TODAY'S TREATMENT:                                                                                                                              DATE:  Eval:  Ther ex:  see below for HEP Self care: Education on sitting position for decreasing pressure on sacrum, body mechanics for work.    PATIENT EDUCATION:  Education details: PT POC, Exam findings, HEP Person educated: Patient Education method: Explanation, Demonstration, Tactile cues, Verbal cues, and Handouts Education comprehension: verbalized understanding, returned demonstration, verbal cues required, tactile cues required, and needs further education   HOME EXERCISE PROGRAM: Access Code: [email or web address] URL: [email or web address] Date: 05/12/2024 Prepared by: Tinnie Don  Exercises - Bent Knee Fallouts  - 1 x daily - 1 sets - 10 reps - 5 sec hold - Supine Piriformis Stretch Pulling Heel to Hip  - 2 x daily - 3 reps - 30 hold -  Hooklying Single Knee to Chest Stretch  - 2 x  daily - 3 reps - 30 hold - Cat Cow  - 2 x daily - 10 reps - 20 hold - Supine Hip Adduction Isometric with Ball  - 1 x daily - 10 reps - 5 hold  ASSESSMENT:  CLINICAL IMPRESSION: Patient presents with primary complaint of  pain in tailbone. She has minimal pain in lumbar spine today with testing, but does have tightness in lumbar musculature. She has most pain on sacrum. She has lack of effective HEP for ongoing pain. She will benefit from mobility for hips, back and sacrum, and strengthening for core and hips. Pt does not have pain at coccyx, do not think sacral relief cushion will be beneficial. Reviewed body mechanics and seated positioning for work.  Pt with decreased ability for full functional activities. Pt will  benefit from skilled PT to improve deficits and pain and to return to PLOF.   OBJECTIVE IMPAIRMENTS: decreased activity tolerance, decreased strength, increased muscle spasms, improper body mechanics, and pain.   ACTIVITY LIMITATIONS: bending, sitting, squatting, sleeping, hygiene/grooming, and locomotion level  PARTICIPATION LIMITATIONS: laundry, driving, shopping, community activity, and yard work  PERSONAL FACTORS: Time since onset of injury/illness/exacerbation are also affecting patient's functional outcome.   REHAB POTENTIAL: Good  CLINICAL DECISION MAKING: Stable/uncomplicated  EVALUATION COMPLEXITY: Low   GOALS: Goals reviewed with patient? Yes  SHORT TERM GOALS: Target date: 06/02/2024   Pt to be independent with initial HEP  Goal status: INITIAL  2.  Pt to report trying different seated/leg/hip position for work duties   Goal status: INITIAL   LONG TERM GOALS: Target date: 07/07/2024   Pt to be independent with final HEP  Goal status: INITIAL  2.  Pt to report decreased pain to 0-3/10 at night , to improve ability for sleep.   Goal status: INITIAL  3.  Pt to demo ability for bend, lift, squat with  optimal mechanics, and no pain, to improve ability for IADLs and gardening.   Goal status: INITIAL  4.  Pt to demo max ability for core and hip strength , to improve stabilization and pain.   Goal status: INITIAL    PLAN:  PT FREQUENCY: 1-2x/week  PT DURATION: 8 weeks  PLANNED INTERVENTIONS: Therapeutic exercises, Therapeutic activity, Neuromuscular re-education, Patient/Family education, Self Care, Joint mobilization, Joint manipulation, Stair training, Orthotic/Fit training, DME instructions, Aquatic Therapy, Dry Needling, Electrical stimulation, Cryotherapy, Moist heat, Taping, Ultrasound, Ionotophoresis 4mg /ml Dexamethasone , Manual therapy,  Vasopneumatic device, Traction, Spinal manipulation, Spinal mobilization,Balance training, Gait training,   PLAN FOR NEXT SESSION: look at feet for Pl Fas pain,   Review HEP,  core strength,    Tinnie Don, PT, DPT 1:02 PM  05/12/24   "

## 2024-05-15 ENCOUNTER — Ambulatory Visit: Admitting: Family Medicine

## 2024-05-15 ENCOUNTER — Encounter: Payer: Self-pay | Admitting: Family Medicine

## 2024-05-15 VITALS — BP 110/73 | HR 91 | Temp 98.0°F | Ht 66.0 in | Wt 224.6 lb

## 2024-05-15 DIAGNOSIS — Z9889 Other specified postprocedural states: Secondary | ICD-10-CM | POA: Diagnosis not present

## 2024-05-15 DIAGNOSIS — M79622 Pain in left upper arm: Secondary | ICD-10-CM | POA: Diagnosis not present

## 2024-05-15 DIAGNOSIS — R590 Localized enlarged lymph nodes: Secondary | ICD-10-CM | POA: Diagnosis not present

## 2024-05-15 NOTE — Progress Notes (Signed)
 OFFICE VISIT  05/15/2024  CC:  Chief Complaint  Patient presents with   Breast Problem    Discomfort in L breast lymph node; more swelling and pain. Appt with provider needed to move up f/u with [facility]   Patient is a 64 y.o. female who presents for pain in left armpit.  HPI:  History of left axillary lymph node, ultrasound April 2025 ok, rpt u/s 10/31 more suspicious so LN bx done-->02/29/2024 revealed benign lymph node with reactive features. Mammogram BI-RADS 3, probably benign April 2025.  She recalls some pain in the left axilla being present prior to the biopsy procedure but it has been worse since the biopsy procedure.  Hurts nearly every day. She is not sure if she can actually feel any focal adenopathy but feels like the left axilla is a bit larger than the right. She has had no fever chills or weight loss. No other areas of soft tissue swelling have been noted.   Past Medical History:  Diagnosis Date   Anxiety    Asthma    uses inhaler prn   Chest pain    Colon polyps    Diarrhea    with constipation while taking diet pills (Alli)   History of rectal bleeding     2 times in past   HSV-2 (herpes simplex virus 2) infection    Palpitations    in past/due to Advil  pm   Vitamin D  deficiency     Past Surgical History:  Procedure Laterality Date   ABDOMINAL HYSTERECTOMY  1998   fibroids. partial   ATRIAL FIBRILLATION ABLATION N/A 04/20/2023   Procedure: ATRIAL FIBRILLATION ABLATION;  Surgeon: Kennyth Chew, MD;  Location: [facility] INVASIVE CV LAB;  Service: Cardiovascular;  Laterality: N/A;   AXILLARY LYMPH NODE BIOPSY Left 02/2024   CATARACT EXTRACTION Bilateral    2020, 2025   CESAREAN SECTION     1 time   COLONOSCOPY W/ POLYPECTOMY  2017   RESECTOSCOPIC MYOMECTOMY     Outpatient Medications Prior to Visit  Medication Sig Dispense Refill   AIRSUPRA  90-80 MCG/ACT AERO SMARTSIG:2 Puff(s) Via Inhaler Every 2 Hours PRN (Patient taking differently: as  needed.)     albuterol (VENTOLIN HFA) 108 (90 Base) MCG/ACT inhaler Inhale into the lungs every 6 (six) hours as needed for wheezing or shortness of breath.     diclofenac  (VOLTAREN ) 75 MG EC tablet Take 1 tablet (75 mg total) by mouth 2 (two) times daily. (Patient taking differently: Take 75 mg by mouth as needed.) 180 tablet 1   furosemide  (LASIX ) 20 MG tablet TAKE 1 TABLET (20 MG TOTAL) BY MOUTH DAILY AS NEEDED (WT GAIN/SHORTNESS OF BREATH). (Patient taking differently: as needed.)     ipratropium-albuterol (DUONEB) 0.5-2.5 (3) MG/3ML SOLN SMARTSIG:1 Vial(s) Every 4-6 Hours PRN (Patient taking differently: as needed.)     montelukast  (SINGULAIR ) 10 MG tablet Take 1 tablet (10 mg total) by mouth at bedtime. (Patient taking differently: Take 10 mg by mouth as needed.) 90 tablet 3   pantoprazole  (PROTONIX ) 40 MG tablet TAKE 1 TABLET BY MOUTH EVERY DAY (Patient taking differently: Take 40 mg by mouth as needed.) 90 tablet 0   sucralfate  (CARAFATE ) 1 g tablet Take 1 tablet (1 g total) by mouth 4 (four) times daily -  with meals and at bedtime. 120 tablet 0   valACYclovir  (VALTREX ) 500 MG tablet Take 1 tablet (500 mg total) by mouth daily. (Patient taking differently: Take 500 mg by mouth as needed.) 90  tablet 3   carbamide peroxide (DEBROX) 6.5 % OTIC solution 5 drops right ear  as needed, follow by gentle warm water lavage (Patient not taking: Reported on 05/15/2024) 15 mL 0   No facility-administered medications prior to visit.    Allergies[1]  Review of Systems  As per HPI  PE:    05/15/2024    4:01 PM 04/14/2024    9:42 AM 03/17/2024   10:58 AM  Vitals with BMI  Height 5' 6 5' 6 5' 6  Weight 224 lbs 10 oz 223 lbs 3 oz 223 lbs 10 oz  BMI 36.27 36.04 36.11  Systolic 110 112 885  Diastolic 73 70 78  Pulse 91 84 85   Physical Exam  Exam chaperoned by Bobbetta Degree, CMA.  Gen: Alert, well appearing.  Patient is oriented to person, place, time, and situation. AFFECT: pleasant,  lucid thought and speech. Left axilla without any erythema, swelling, or warmth. I do not feel any nodularity or fluctuance in the area of the upper outer left breast or in the axillary region.  LABS:  Last CBC Lab Results  Component Value Date   WBC 7.9 01/28/2024   HGB 13.1 01/28/2024   HCT 39.5 01/28/2024   MCV 85.6 01/28/2024   MCH 29.1 05/27/2023   RDW 13.7 01/28/2024   PLT 227.0 01/28/2024   Last metabolic panel Lab Results  Component Value Date   GLUCOSE 109 (H) 01/28/2024   NA 139 01/28/2024   K 3.9 01/28/2024   CL 104 01/28/2024   CO2 27 01/28/2024   BUN 15 01/28/2024   CREATININE 0.72 01/28/2024   GFR 90.38 01/28/2024   CALCIUM 9.3 01/28/2024   PROT 7.8 03/03/2024   ALBUMIN 4.2 01/28/2024   BILITOT 0.4 01/28/2024   ALKPHOS 56 01/28/2024   AST 17 01/28/2024   ALT 13 01/28/2024   ANIONGAP 10 05/21/2023   Lab Results  Component Value Date   ESRSEDRATE 23 08/27/2023   Lab Results  Component Value Date   CRP <0.5 01/28/2024   IMPRESSION AND PLAN:  Left axilla pain. History of left axillary reactive lymphadenopathy (biopsy 02/2024). Her pain is worse status post biopsy procedure, bringing to mind possible peripheral nerve injury. No sign of infection. Reassuringly, I do not feel any adenopathy.  Given the overall situation, though, I do feel like it would be best to go ahead and get her left axilla follow-up ultrasound moved up--> ordered today.  An After Visit Summary was printed and given to the patient.  FOLLOW UP: No follow-ups on file.  Signed:  Gerlene Hockey, MD           05/15/2024     [1]  Allergies Allergen Reactions   Ciprofloxacin Anaphylaxis and Other (See Comments)   Eliquis  [Apixaban ] Shortness Of Breath    SOB/heavy chest   Sulfa Antibiotics     hives   Cat Dander Other (See Comments)   Dog Epithelium (Canis Lupus Familiaris) Other (See Comments)

## 2024-05-19 ENCOUNTER — Ambulatory Visit: Payer: Self-pay

## 2024-05-19 ENCOUNTER — Ambulatory Visit: Admitting: Family Medicine

## 2024-05-19 ENCOUNTER — Encounter: Payer: Self-pay | Admitting: Family Medicine

## 2024-05-19 VITALS — BP 118/72 | HR 87 | Temp 98.1°F | Ht 66.0 in | Wt 225.0 lb

## 2024-05-19 DIAGNOSIS — J069 Acute upper respiratory infection, unspecified: Secondary | ICD-10-CM

## 2024-05-19 DIAGNOSIS — J029 Acute pharyngitis, unspecified: Secondary | ICD-10-CM | POA: Diagnosis not present

## 2024-05-19 LAB — POC COVID19 BINAXNOW: SARS Coronavirus 2 Ag: NEGATIVE

## 2024-05-19 LAB — POCT INFLUENZA A/B
Influenza A, POC: NEGATIVE
Influenza B, POC: NEGATIVE

## 2024-05-19 MED ORDER — PREDNISONE 20 MG PO TABS: ORAL_TABLET | ORAL | 0 refills | Status: AC

## 2024-05-19 NOTE — Telephone Encounter (Signed)
 FYI Only or Action Required?: Action required by provider: request for appointment.  Patient was last seen in primary care on 05/15/2024 by McGowen, Aleene DEL, MD.  Called Nurse Triage reporting sinus symptoms.  Symptoms began yesterday.  Interventions attempted: Nothing.  Symptoms are: gradually worsening. Sinus pain, pressure. Green discharge. Sore throat.  Triage Disposition: See HCP Within 4 Hours (Or PCP Triage)  Patient/caregiver understands and will follow disposition?: Yes      Reason for Triage: Pt stated that last night her sinuses were burning but the burning has now moved down into her throat. Pt stated that her throat is burning and in pain and wants to request a medication or appt.  Reason for Disposition  [1] SEVERE sinus pain (e.g., excruciating) AND [2] not improved 2 hours after pain medicine  Answer Assessment - Initial Assessment Questions 1. LOCATION: Where does it hurt?      face 2. ONSET: When did the sinus pain start?  (e.g., hours, days)      Last night 3. SEVERITY: How bad is the pain?   (Scale 0-10; or none, mild, moderate or severe)     severe 4. RECURRENT SYMPTOM: Have you ever had sinus problems before? If Yes, ask: When was the last time? and What happened that time?      yes 5. NASAL CONGESTION: Is the nose blocked? If Yes, ask: Can you open it or must you breathe through your mouth?     no 6. NASAL DISCHARGE: Do you have discharge from your nose? If so ask, What color?     yellow 7. FEVER: Do you have a fever? If Yes, ask: What is it, how was it measured, and when did it start?      no 8. OTHER SYMPTOMS: Do you have any other symptoms? (e.g., sore throat, cough, earache, difficulty breathing)     Sore throat 9. PREGNANCY: Is there any chance you are pregnant? When was your last menstrual period?     no  Protocols used: Sinus Pain or Congestion-A-AH
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
## 2024-05-23 ENCOUNTER — Other Ambulatory Visit: Payer: Self-pay | Admitting: Family Medicine

## 2024-05-23 DIAGNOSIS — M79622 Pain in left upper arm: Secondary | ICD-10-CM

## 2024-05-23 DIAGNOSIS — Z9889 Other specified postprocedural states: Secondary | ICD-10-CM

## 2024-05-23 DIAGNOSIS — R590 Localized enlarged lymph nodes: Secondary | ICD-10-CM

## 2024-06-02 ENCOUNTER — Encounter: Payer: Self-pay | Admitting: Physical Therapy

## 2024-06-02 ENCOUNTER — Ambulatory Visit (INDEPENDENT_AMBULATORY_CARE_PROVIDER_SITE_OTHER): Admitting: Physical Therapy

## 2024-06-02 DIAGNOSIS — M5459 Other low back pain: Secondary | ICD-10-CM

## 2024-06-02 NOTE — Therapy (Signed)
 " OUTPATIENT PHYSICAL THERAPY LOWER EXTREMITY TREATMENT   Patient Name: Jade [name] MRN: [id number] DOB:[birth year], 64 y.o., female Today's Date: 06/02/2024  END OF SESSION:  PT End of Session - 06/02/24 1148     Visit Number 2    Number of Visits 16    Date for Recertification  07/07/24    Authorization Type [employer]    PT Start Time 1148    PT Stop Time 1230    PT Time Calculation (min) 42 min    Activity Tolerance Patient tolerated treatment well    Behavior During Therapy WFL for tasks assessed/performed          Past Medical History:  Diagnosis Date   Anxiety    Asthma    uses inhaler prn   Chest pain    Colon polyps    Diarrhea    with constipation while taking diet pills (Alli)   History of rectal bleeding     2 times in past   HSV-2 (herpes simplex virus 2) infection    Palpitations    in past/due to Advil  pm   Vitamin D  deficiency    Past Surgical History:  Procedure Laterality Date   ABDOMINAL HYSTERECTOMY  1998   fibroids. partial   ATRIAL FIBRILLATION ABLATION N/A 04/20/2023   Procedure: ATRIAL FIBRILLATION ABLATION;  Surgeon: Kennyth Chew, MD;  Location: [facility] INVASIVE CV LAB;  Service: Cardiovascular;  Laterality: N/A;   AXILLARY LYMPH NODE BIOPSY Left 02/2024   CATARACT EXTRACTION Bilateral    2020, 2025   CESAREAN SECTION     1 time   COLONOSCOPY W/ POLYPECTOMY  2017   RESECTOSCOPIC MYOMECTOMY     Patient Active Problem List   Diagnosis Date Noted   Polyarthralgia 08/30/2023   Positive ANA (antinuclear antibody) 08/30/2023   Ds DNA antibody positive 08/30/2023   Calf tenderness 08/27/2023   Paroxysmal atrial fibrillation (HCC)-status post ablation 04/30/2023   Pericarditis 04/22/2023   Hepatic steatosis 04/16/2023   Chondromalacia of left patella 09/08/2021   DDD (degenerative disc disease), lumbar 04/07/2019   Nonallopathic lesion of lumbosacral region 04/07/2019   Nonallopathic lesion of sacral region 04/07/2019   Inflammation of  sacroiliac joint 11/25/2018   Lumbar spondylosis 08/30/2018   Arthritis of carpometacarpal [facility]) joint of right thumb 12/24/2017   Nonallopathic lesion of cervical region 12/24/2017   Nonallopathic lesion of thoracic region 12/24/2017   Nonallopathic lesion of rib cage 12/24/2017   Obesity (BMI 30-39.9) 05/14/2017   H/O vitamin D  deficiency 01/24/2016   Menopausal symptoms 12/02/2012   Asthma    HSV-2 (herpes simplex virus 2) infection     PCP: Charlies Bellini  REFERRING PROVIDER: Maya Nash   REFERRING DIAG: Low back pain   THERAPY DIAG:  Other low back pain  Rationale for Evaluation and Treatment: Rehabilitation  ONSET DATE: 1 yr ago     SUBJECTIVE:   SUBJECTIVE STATEMENT: Pt states back/tailbone feeling a bit better. She changed her seat at work, is doing HEP, and also had several days off this week. She has not been able to change her sitting position.    Eval: Pt states increased pain about 1 year ago, when she was cleaning her car. States increased pain in center, low/on tailbone.  Waking up at night due to pain. States pain is Mostly at night when trying to go to sleep, can't get comfortable. She is Taking tylenol .  More recently she is Also starting to feel it while at work. 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 She  sits with legs wide apart.  Also notes more pain with bending and gardening.  Exercise:  none right now.  Pl fascitis:  L and R.  R now worse.  Wearing hokas.  And inserts. Over 1 year.  Hokas: not helping pain- likes the cushion.   L lympoh node swollen- sore, will f/u with MD,  Did have biopsy that was negative. ( 2 1/2 mo ago)    PERTINENT HISTORY: A-Fib , polyarthralgia, + ANA  PAIN:  Are you having pain? Yes: NPRS scale: 7-8 /10 Pain location: Back , not radiating, no numbness  Pain description: sore  Aggravating factors: sitting, bending , work , gardening, sleeping  Relieving factors: none stated    PRECAUTIONS: None  WEIGHT BEARING RESTRICTIONS:  No  FALLS:  Has patient fallen in last 6 months? No   PLOF: Independent  PATIENT GOALS:  Decreased pain in tailbone    NEXT MD VISIT:   OBJECTIVE:   DIAGNOSTIC FINDINGS:   PATIENT SURVEYS:    COGNITION: Overall cognitive status: Within functional limits for tasks assessed     SENSATION: WFL  EDEMA: N/A   POSTURE:    No Significant postural limitations Seated work position: legs wide in ER/abducted position.   PALPATION:  No tenderness to lumbar spine. L paraspinals tighter than R.   Most Pain directly on sacrum no pain at coccyx. mild into sacral ligament bil;    LOWER EXTREMITY ROM: Lumbar: WFL Hips: WFL Knees: WFL Ankle: will test next visit   LOWER EXTREMITY MMT: Will test ankle next visit  MMT Left eval Right  eval  Hip flexion 4 4  Hip extension    Hip abduction 4+ 4+  Hip adduction    Hip internal rotation    Hip external rotation    Knee flexion 5 5  Knee extension 5 5  Ankle dorsiflexion    Ankle plantarflexion    Ankle inversion    Ankle eversion     (Blank rows = not tested)  LOWER EXTREMITY SPECIAL TESTS:  Pt with relief in hip ER position, and with hip add squeeze to relieve pressure on sacrum    GAIT: Unremarkable    TODAY'S TREATMENT:                                                                                                                              DATE:   06/02/2024 Therapeutic Exercise: Aerobic: Supine: bridge x 10 then x 5;  ER fallouts x 10;   Piriformis stretch x 2 bil;   clams blue tb x 10 (pain in tailbone)  Seated:  Standing:  HR 2 x 10;   SLS 30 sec x 2 bil;  ankld DF glides on step 2 x 10 bil (1st step) ; gastroc stretch on step x 3;  Stretches:  Neuromuscular Re-education: Manual Therapy: Therapeutic Activity: Self Care:    Eval:  Ther ex:  see below for HEP Self care: Education  on sitting position for decreasing pressure on sacrum, body mechanics for work.    PATIENT EDUCATION:  Education  details: updated and reviewed HEP Person educated: Patient Education method: Explanation, Demonstration, Tactile cues, Verbal cues, and Handouts Education comprehension: verbalized understanding, returned demonstration, verbal cues required, tactile cues required, and needs further education   HOME EXERCISE PROGRAM: Access Code: [email or web address] URL: [email or web address] Date: 05/12/2024 Prepared by: Tinnie Don  Exercises - Bent Knee Fallouts  - 1 x daily - 1 sets - 10 reps - 5 sec hold - Supine Piriformis Stretch Pulling Heel to Hip  - 2 x daily - 3 reps - 30 hold - Hooklying Single Knee to Chest Stretch  - 2 x daily - 3 reps - 30 hold - Cat Cow  - 2 x daily - 10 reps - 20 hold - Supine Hip Adduction Isometric with Ball  - 1 x daily - 10 reps - 5 hold  ASSESSMENT:  CLINICAL IMPRESSION: Pt with improving pain in tailbone. Will benefit from continued strength for hips and core. She got new shoes today, and also has new orthotics. In standing, pt with supinated foot bil, L>R. She will continue to wear new shoes and orthotics to assess comfort. Reviewed HEP for ongoing foot pain today. She has tightness in gastroc and ankle with limited DF, and increased pl fascia pain bil. Pt to benefit from continued care.   Eval: Patient presents with primary complaint of  pain in tailbone. She has minimal pain in lumbar spine today with testing, but does have tightness in lumbar musculature. She has most pain on sacrum. She has lack of effective HEP for ongoing pain. She will benefit from mobility for hips, back and sacrum, and strengthening for core and hips. Pt does not have pain at coccyx, do not think sacral relief cushion will be beneficial. Reviewed body mechanics and seated positioning for work.  Pt with decreased ability for full functional activities. Pt will  benefit from skilled PT to improve deficits and pain and to return to PLOF.   OBJECTIVE IMPAIRMENTS: decreased activity  tolerance, decreased strength, increased muscle spasms, improper body mechanics, and pain.   ACTIVITY LIMITATIONS: bending, sitting, squatting, sleeping, hygiene/grooming, and locomotion level  PARTICIPATION LIMITATIONS: laundry, driving, shopping, community activity, and yard work  PERSONAL FACTORS: Time since onset of injury/illness/exacerbation are also affecting patient's functional outcome.   REHAB POTENTIAL: Good  CLINICAL DECISION MAKING: Stable/uncomplicated  EVALUATION COMPLEXITY: Low   GOALS: Goals reviewed with patient? Yes  SHORT TERM GOALS: Target date: 06/02/2024   Pt to be independent with initial HEP  Goal status: INITIAL  2.  Pt to report trying different seated/leg/hip position for work duties   Goal status: INITIAL   LONG TERM GOALS: Target date: 07/07/2024   Pt to be independent with final HEP  Goal status: INITIAL  2.  Pt to report decreased pain to 0-3/10 at night , to improve ability for sleep.   Goal status: INITIAL  3.  Pt to demo ability for bend, lift, squat with optimal mechanics, and no pain, to improve ability for IADLs and gardening.   Goal status: INITIAL  4.  Pt to demo max ability for core and hip strength , to improve stabilization and pain.   Goal status: INITIAL    PLAN:  PT FREQUENCY: 1-2x/week  PT DURATION: 8 weeks  PLANNED INTERVENTIONS: Therapeutic exercises, Therapeutic activity, Neuromuscular re-education, Patient/Family education, Self Care, Joint mobilization, Joint manipulation, Stair training, Orthotic/Fit training, DME instructions, Aquatic Therapy,  Dry Needling, Electrical stimulation, Cryotherapy, Moist heat, Taping, Ultrasound, Ionotophoresis 4mg /ml Dexamethasone , Manual therapy,  Vasopneumatic device, Traction, Spinal manipulation, Spinal mobilization,Balance training, Gait training,   PLAN FOR NEXT SESSION:   look at feet for Pl Fas pain,   Review HEP,  core strength,  Hip abd- s/l or standing,   clams,  hip strength, glute strength. Foot strength.    Tinnie Don, PT, DPT 11:48 AM  06/02/24   "

## 2024-06-09 ENCOUNTER — Other Ambulatory Visit

## 2024-06-09 ENCOUNTER — Encounter: Admitting: Physical Therapy

## 2024-06-09 ENCOUNTER — Encounter

## 2024-06-09 ENCOUNTER — Ambulatory Visit: Admitting: Family Medicine

## 2024-06-16 ENCOUNTER — Ambulatory Visit: Payer: Self-pay | Admitting: Physician Assistant

## 2024-06-23 ENCOUNTER — Encounter: Admitting: Physical Therapy

## 2024-06-30 ENCOUNTER — Ambulatory Visit: Admitting: Family Medicine

## 2024-06-30 ENCOUNTER — Encounter: Admitting: Physical Therapy

## 2024-09-15 ENCOUNTER — Ambulatory Visit: Payer: Self-pay | Admitting: Rheumatology

## 2024-10-20 ENCOUNTER — Ambulatory Visit: Admitting: Family Medicine
